# Patient Record
Sex: Female | Born: 1970 | Race: White | Hispanic: No | Marital: Married | Smoking: Never smoker
Health system: Southern US, Community
[De-identification: ages and names within clinical notes are randomized; demographics above are authoritative.]

## PROBLEM LIST (undated history)

## (undated) DIAGNOSIS — Z87442 Personal history of urinary calculi: Secondary | ICD-10-CM

## (undated) DIAGNOSIS — E876 Hypokalemia: Secondary | ICD-10-CM

## (undated) DIAGNOSIS — N950 Postmenopausal bleeding: Secondary | ICD-10-CM

## (undated) DIAGNOSIS — L719 Rosacea, unspecified: Secondary | ICD-10-CM

## (undated) DIAGNOSIS — K219 Gastro-esophageal reflux disease without esophagitis: Secondary | ICD-10-CM

## (undated) DIAGNOSIS — D500 Iron deficiency anemia secondary to blood loss (chronic): Secondary | ICD-10-CM

## (undated) DIAGNOSIS — K649 Unspecified hemorrhoids: Secondary | ICD-10-CM

## (undated) DIAGNOSIS — I10 Essential (primary) hypertension: Secondary | ICD-10-CM

## (undated) DIAGNOSIS — K589 Irritable bowel syndrome without diarrhea: Secondary | ICD-10-CM

## (undated) DIAGNOSIS — M5431 Sciatica, right side: Secondary | ICD-10-CM

## (undated) DIAGNOSIS — J329 Chronic sinusitis, unspecified: Secondary | ICD-10-CM

## (undated) DIAGNOSIS — Z8639 Personal history of other endocrine, nutritional and metabolic disease: Secondary | ICD-10-CM

## (undated) DIAGNOSIS — D509 Iron deficiency anemia, unspecified: Secondary | ICD-10-CM

## (undated) DIAGNOSIS — K581 Irritable bowel syndrome with constipation: Secondary | ICD-10-CM

## (undated) DIAGNOSIS — M51379 Other intervertebral disc degeneration, lumbosacral region without mention of lumbar back pain or lower extremity pain: Secondary | ICD-10-CM

## (undated) DIAGNOSIS — R112 Nausea with vomiting, unspecified: Secondary | ICD-10-CM

## (undated) DIAGNOSIS — J189 Pneumonia, unspecified organism: Secondary | ICD-10-CM

## (undated) DIAGNOSIS — M5137 Other intervertebral disc degeneration, lumbosacral region: Secondary | ICD-10-CM

## (undated) DIAGNOSIS — Z8489 Family history of other specified conditions: Secondary | ICD-10-CM

## (undated) DIAGNOSIS — D649 Anemia, unspecified: Secondary | ICD-10-CM

## (undated) DIAGNOSIS — Z9889 Other specified postprocedural states: Secondary | ICD-10-CM

## (undated) DIAGNOSIS — R102 Pelvic and perineal pain: Secondary | ICD-10-CM

## (undated) DIAGNOSIS — N920 Excessive and frequent menstruation with regular cycle: Secondary | ICD-10-CM

## (undated) DIAGNOSIS — M5430 Sciatica, unspecified side: Secondary | ICD-10-CM

## (undated) HISTORY — PX: UPPER GI ENDOSCOPY: SHX6162

---

## 2000-06-29 HISTORY — PX: DIAGNOSTIC LAPAROSCOPY: SUR761

## 2001-06-29 HISTORY — PX: EYE SURGERY: SHX253

## 2003-06-30 HISTORY — PX: FOOT SURGERY: SHX648

## 2005-09-22 ENCOUNTER — Other Ambulatory Visit: Admission: RE | Admit: 2005-09-22 | Discharge: 2005-09-22 | Payer: Self-pay | Admitting: Obstetrics & Gynecology

## 2009-04-01 ENCOUNTER — Ambulatory Visit (HOSPITAL_COMMUNITY): Admission: RE | Admit: 2009-04-01 | Discharge: 2009-04-01 | Payer: Self-pay | Admitting: Obstetrics & Gynecology

## 2010-06-29 HISTORY — PX: COLONOSCOPY W/ POLYPECTOMY: SHX1380

## 2010-06-29 HISTORY — PX: APPENDECTOMY: SHX54

## 2011-03-09 DIAGNOSIS — D649 Anemia, unspecified: Secondary | ICD-10-CM | POA: Insufficient documentation

## 2011-05-15 ENCOUNTER — Observation Stay (HOSPITAL_COMMUNITY)
Admission: AD | Admit: 2011-05-15 | Discharge: 2011-05-16 | Disposition: A | Discharge status: Home / Self Care | Payer: PRIVATE HEALTH INSURANCE | Source: Ambulatory Visit | Attending: General Surgery | Admitting: General Surgery

## 2011-05-15 ENCOUNTER — Encounter (HOSPITAL_COMMUNITY): Payer: Self-pay | Admitting: *Deleted

## 2011-05-15 ENCOUNTER — Other Ambulatory Visit: Payer: Self-pay | Admitting: General Surgery

## 2011-05-15 DIAGNOSIS — R197 Diarrhea, unspecified: Secondary | ICD-10-CM | POA: Insufficient documentation

## 2011-05-15 DIAGNOSIS — R110 Nausea: Secondary | ICD-10-CM | POA: Insufficient documentation

## 2011-05-15 DIAGNOSIS — R1031 Right lower quadrant pain: Principal | ICD-10-CM | POA: Insufficient documentation

## 2011-05-15 HISTORY — DX: Pneumonia, unspecified organism: J18.9

## 2011-05-15 HISTORY — DX: Sciatica, unspecified side: M54.30

## 2011-05-15 HISTORY — DX: Gastro-esophageal reflux disease without esophagitis: K21.9

## 2011-05-15 LAB — CREATININE, SERUM
Creatinine, Ser: 0.75 mg/dL (ref 0.50–1.10)
GFR calc Af Amer: >90 mL/min (ref >90)
GFR calc non Af Amer: >90 mL/min (ref >90)

## 2011-05-15 LAB — CBC
HCT: 29.4 % — ABNORMAL LOW (ref 36.0–46.0)
Hemoglobin: 10 g/dL — ABNORMAL LOW (ref 12.0–15.0)
MCH: 28.7 pg (ref 26.0–34.0)
MCHC: 34 g/dL (ref 30.0–36.0)
MCV: 84.2 fL (ref 78.0–100.0)
Platelets: 238 10*3/uL (ref 150–400)
RBC: 3.49 MIL/uL — ABNORMAL LOW (ref 3.87–5.11)
RDW: 13.6 % (ref 11.5–15.5)
WBC: 6.6 10*3/uL (ref 4.0–10.5)

## 2011-05-15 MED ORDER — PANTOPRAZOLE SODIUM 40 MG IV SOLR
40.0000 mg | Freq: Every day | INTRAVENOUS | Status: DC
  Administered 2011-05-15: 40 mg via INTRAVENOUS
  Filled 2011-05-15: qty 40

## 2011-05-15 MED ORDER — ENOXAPARIN SODIUM 40 MG/0.4ML ~~LOC~~ SOLN
40.0000 mg | SUBCUTANEOUS | Status: DC
  Administered 2011-05-15: 40 mg via SUBCUTANEOUS
  Filled 2011-05-15: qty 0.4

## 2011-05-15 MED ORDER — ONDANSETRON HCL 4 MG/2ML IJ SOLN
4.0000 mg | Freq: Four times a day (QID) | INTRAMUSCULAR | Status: DC | PRN
  Administered 2011-05-15: 4 mg via INTRAVENOUS
  Filled 2011-05-15: qty 2

## 2011-05-15 MED ORDER — LACTATED RINGERS IV SOLN
INTRAVENOUS | Status: DC
  Administered 2011-05-15: 23:00:00 via INTRAVENOUS

## 2011-05-15 MED ORDER — DIPHENHYDRAMINE HCL 25 MG PO CAPS
25.0000 mg | ORAL_CAPSULE | Freq: Once | ORAL | Status: AC
  Administered 2011-05-15: 25 mg via ORAL
  Filled 2011-05-15: qty 1

## 2011-05-15 MED ORDER — SODIUM CHLORIDE 0.9 % IJ SOLN
INTRAMUSCULAR | Status: AC
  Administered 2011-05-15: 10 mL
  Filled 2011-05-15: qty 3

## 2011-05-15 MED ORDER — MORPHINE SULFATE 4 MG/ML IJ SOLN
1.0000 mg | INTRAMUSCULAR | Status: DC | PRN
  Administered 2011-05-15 – 2011-05-16 (×2): 1 mg via INTRAVENOUS
  Filled 2011-05-15 (×2): qty 1

## 2011-05-16 LAB — BASIC METABOLIC PANEL
BUN: 8 mg/dL (ref 6–23)
CO2: 28 mEq/L (ref 19–32)
Calcium: 8.6 mg/dL (ref 8.4–10.5)
Chloride: 107 mEq/L (ref 96–112)
Creatinine, Ser: 0.93 mg/dL (ref 0.50–1.10)
GFR calc Af Amer: 89 mL/min — ABNORMAL LOW (ref >90)
GFR calc non Af Amer: 77 mL/min — ABNORMAL LOW (ref >90)
Glucose, Bld: 87 mg/dL (ref 70–99)
Potassium: 3.8 mEq/L (ref 3.5–5.1)
Sodium: 139 mEq/L (ref 135–145)

## 2011-05-16 LAB — CBC
HCT: 30 % — ABNORMAL LOW (ref 36.0–46.0)
Hemoglobin: 10.1 g/dL — ABNORMAL LOW (ref 12.0–15.0)
MCH: 28.5 pg (ref 26.0–34.0)
MCHC: 33.7 g/dL (ref 30.0–36.0)
MCV: 84.7 fL (ref 78.0–100.0)
Platelets: 213 10*3/uL (ref 150–400)
RBC: 3.54 MIL/uL — ABNORMAL LOW (ref 3.87–5.11)
RDW: 13.5 % (ref 11.5–15.5)
WBC: 5.6 10*3/uL (ref 4.0–10.5)

## 2011-05-16 MED ORDER — CELECOXIB 100 MG PO CAPS
200.0000 mg | ORAL_CAPSULE | Freq: Two times a day (BID) | ORAL | Status: DC
  Administered 2011-05-16: 200 mg via ORAL
  Filled 2011-05-16 (×10): qty 2

## 2011-05-16 MED ORDER — SODIUM CHLORIDE 0.9 % IJ SOLN
INTRAMUSCULAR | Status: AC
  Administered 2011-05-16: 10 mL
  Filled 2011-05-16: qty 3

## 2011-05-16 NOTE — H&P (Addendum)
Kaitlin [name] is an 40 y.o. female.   Chief Complaint: Right lower quadrant abdominal pain HPI: Patient presented from Dr. Jeannette How office with a history of right lower quadrant abdominal pain since Wednesday. Patient had been to the emergency department at [facility] during the week related to this pain. She states pain started initially in the upper abdomen and then localized to the periumbilical and right lower quadrant abdominal areas. He was actually worse yesterday morning while at the emergency department but since improved somewhat. She describes it as intermittent sharp stabbing pain. She has had associated nausea but no significant episodes of emesis. No change in bowel movements although initially had some diarrhea. No melena no hematochezia. She has had a history of abdominal pain but never localized in this area. No sick contacts. No recent upper respiratory tract infections. No significant change with diet although appetite has been less. No abnormal travel or food exposure.  Past Medical History  Diagnosis Date  . Acid reflux   . Sciatica   . Pneumonia 2010    Past Surgical History  Procedure Date  . Diagnostic laparoscopy   . Eye surgery 2003  . Foot surgery 2005    History reviewed. No pertinent family history. Social History:  reports that she has never smoked. She does not have any smokeless tobacco history on file. She reports that she does not drink alcohol or use illicit drugs.  Allergies:  Allergies  Allergen Reactions  . Azithromycin Itching  . Hydrocodone Itching    Medications Prior to Admission  Medication Dose Route Frequency Provider Last Rate Last Dose  . celecoxib (CELEBREX) capsule 200 mg  200 mg Oral BID Fabio Bering      . diphenhydrAMINE (BENADRYL) capsule 25 mg  25 mg Oral Once Fabio Bering   25 mg at 05/15/11 2343  . enoxaparin (LOVENOX) injection 40 mg  40 mg Subcutaneous Q24H Anzal Bartnick C June Vacha   40 mg at 05/15/11 2300  .  lactated ringers infusion   Intravenous Continuous Fabio Bering 100 mL/hr at 05/15/11 2300    . morphine 2 MG/ML injection 1-2 mg  1-2 mg Intravenous Q4H PRN Fabio Bering   1 mg at 05/16/11 0440  . ondansetron (ZOFRAN) injection 4 mg  4 mg Intravenous Q6H PRN Fabio Bering   4 mg at 05/15/11 2300  . pantoprazole (PROTONIX) injection 40 mg  40 mg Intravenous QHS Gigi Gin Osias Resnick   40 mg at 05/15/11 2300  . sodium chloride 0.9 % injection        10 mL at 05/15/11 2300  . sodium chloride 0.9 % injection        10 mL at 05/16/11 0440   Medications Prior to Admission  Medication Sig Dispense Refill  . cetirizine (ZYRTEC) 10 MG tablet Take 10 mg by mouth daily as needed. For allergies       . cyclobenzaprine (FLEXERIL) 10 MG tablet Take 10 mg by mouth 3 (three) times daily as needed. Muscle spasms       . ferrous fumarate (HEMOCYTE - 106 MG FE) 325 (106 FE) MG TABS Take 1 tablet by mouth daily as needed. Low iron       . fluticasone (FLONASE) 50 MCG/ACT nasal spray Place 2 sprays into the nose daily as needed. allergies       . pantoprazole (PROTONIX) 40 MG tablet Take 40 mg by mouth daily.          Results for  orders placed during the hospital encounter of 05/15/11 (from the past 48 hour(s))  CBC     Status: Abnormal   Collection Time   05/15/11 10:59 PM      Component Value Range Comment   WBC 6.6  4.0 - 10.5 (K/uL)    RBC 3.49 (*) 3.87 - 5.11 (MIL/uL)    Hemoglobin 10.0 (*) 12.0 - 15.0 (g/dL)    HCT 16.1 (*) 09.6 - 46.0 (%)    MCV 84.2  78.0 - 100.0 (fL)    MCH 28.7  26.0 - 34.0 (pg)    MCHC 34.0  30.0 - 36.0 (g/dL)    RDW 04.5  40.9 - 81.1 (%)    Platelets 238  150 - 400 (K/uL)   CREATININE, SERUM     Status: Normal   Collection Time   05/15/11 10:59 PM      Component Value Range Comment   Creatinine, Ser 0.75  0.50 - 1.10 (mg/dL)    GFR calc non Af Amer >90  >90 (mL/min)    GFR calc Af Amer >90  >90 (mL/min)   BASIC METABOLIC PANEL     Status: Abnormal   Collection Time    05/16/11  6:22 AM      Component Value Range Comment   Sodium 139  135 - 145 (mEq/L)    Potassium 3.8  3.5 - 5.1 (mEq/L)    Chloride 107  96 - 112 (mEq/L)    CO2 28  19 - 32 (mEq/L)    Glucose, Bld 87  70 - 99 (mg/dL)    BUN 8  6 - 23 (mg/dL)    Creatinine, Ser 9.14  0.50 - 1.10 (mg/dL)    Calcium 8.6  8.4 - 10.5 (mg/dL)    GFR calc non Af Amer 77 (*) >90 (mL/min)    GFR calc Af Amer 89 (*) >90 (mL/min)   CBC     Status: Abnormal   Collection Time   05/16/11  6:22 AM      Component Value Range Comment   WBC 5.6  4.0 - 10.5 (K/uL)    RBC 3.54 (*) 3.87 - 5.11 (MIL/uL)    Hemoglobin 10.1 (*) 12.0 - 15.0 (g/dL)    HCT 78.2 (*) 95.6 - 46.0 (%)    MCV 84.7  78.0 - 100.0 (fL)    MCH 28.5  26.0 - 34.0 (pg)    MCHC 33.7  30.0 - 36.0 (g/dL)    RDW 21.3  08.6 - 57.8 (%)    Platelets 213  150 - 400 (K/uL)    No results found.  Review of Systems  Constitutional: Negative for fever, chills, weight loss, malaise/fatigue and diaphoresis.  HENT: Negative.   Eyes: Negative.   Respiratory: Negative.   Cardiovascular: Negative.   Gastrointestinal: Positive for nausea, abdominal pain (right lower quadrant) and diarrhea. Negative for vomiting, constipation, blood in stool and melena.  Genitourinary: Negative.   Musculoskeletal: Negative.   Skin: Negative.   Neurological: Negative.  Negative for weakness.  Endo/Heme/Allergies: Negative.   Psychiatric/Behavioral: Negative.     Blood pressure 120/76, pulse 81, temperature 98.2 F (36.8 C), temperature source Oral, resp. rate 20, height 5\' 6"  (1.676 m), weight 90 kg (198 lb 6.6 oz), SpO2 98.00%. Physical Exam  Constitutional: She is oriented to person, place, and time. She appears well-developed and well-nourished.  HENT:  Head: Normocephalic and atraumatic.  Eyes: Conjunctivae and EOM are normal. Pupils are equal, round, and reactive to light.  Neck:  Normal range of motion. Neck supple. No thyromegaly present.  Cardiovascular: Normal rate,  regular rhythm and normal heart sounds.   Respiratory: Effort normal and breath sounds normal. No respiratory distress. She has no wheezes.  GI: Soft. Bowel sounds are normal. She exhibits no distension and no mass. There is tenderness (mild right lower quadrant abdominal tenderness. No peritoneal signs.). There is no rebound and no guarding.  Musculoskeletal: Normal range of motion.  Lymphadenopathy:    She has no cervical adenopathy.  Neurological: She is alert and oriented to person, place, and time.  Skin: Skin is warm and dry.     Assessment/Plan Right lower quadrant abdominal pain. As discussed with both Dr. Dimas Aguas and the patient on the phone, I have a low suspicion that this is acute appendicitis however given her equivocal CT findings I do feel is reasonable to admit the patient for observation, bowel rest, IV fluid hydration, and reevaluate blood work and reevaluate her symptoms. She'll therefore be admitted in observation status to rule out appendiceal origin of her symptoms.  Kaijah Abts C 05/16/2011, 11:53 AM   Morning evaluation:  While patient still has some symptomatology it has not worsened overnight. Morning blood work demonstrates no elevation in her WBC count and given the course of her symptoms I do not feel at this time her appendix is the etiology of her symptoms. I am more suspicious of a viral gastroenteritis/mesenteric adenitis as her etiology or possibly exacerbation of her known and documented IBS. I feel this time advancement of her diet as appropriate and should she continue to demonstrate improvement plan for discharge this afternoon.

## 2011-05-25 NOTE — Discharge Summary (Signed)
Physician Discharge Summary  Patient ID: Kaitlin [name] MRN: [id number] DOB/AGE: 39-Aug-1972 39 y.o.  Admit date: 05/15/2011 Discharge date: 05/16/2011  Admission Diagnoses: Right lower quadrant abdominal pain  Discharge Diagnoses: Suspected mesenteric adenitis Active Problems:  * No active hospital problems. *    Discharged Condition: stable  Hospital Course: Patient was admitted as a direct admission by request of Dr. Dimas Aguas. Patient presented with several day history of right lower quadrant abdominal pain to her primary care physician, Dr. Dimas Aguas, and while blood work was within normal limits her CT evaluation of the abdomen and pelvis was unequivocal and acute early appendicitis cannot be ruled out. She was therefore admitted for 23 hour observation. IV fluid was administered as was the bowel rest and DVT prophylaxis. While she continued to have some right lower quadrant abdominal pain the morning after her admission this had improved somewhat and she was started on a diet. Her blood work remained within normal limits with a normal white blood cell count and plans were made for discharge to home.  Consults: none  Significant Diagnostic Studies: labs: CBC  Treatments: IV hydration and DVT prophylaxis  Discharge Exam: Blood pressure 120/76, pulse 81, temperature 98.2 F (36.8 C), temperature source Oral, resp. rate 20, height 5\' 6"  (1.676 m), weight 90 kg (198 lb 6.6 oz), SpO2 98.00%. General appearance: alert and no distress Eyes: Pupils equal round reactive extra commands are intact. Resp: clear to auscultation bilaterally Cardio: regular rate and rhythm GI: Soft, mild right lower quadrant abdominal pain with deep palpation, no peritoneal signs, no masses or hernias. Extremities: Warm and dry.  Disposition: Home or Self Care  Discharge Orders    Future Orders Please Complete By Expires   Diet - low sodium heart healthy      Increase activity slowly      Discharge  instructions      Comments:   Bland diet, advance as tolerated.   Call MD for:  temperature >100.4      Call MD for:  persistant nausea and vomiting      Call MD for:  severe uncontrolled pain        Discharge Medication List as of 05/16/2011  4:14 PM    CONTINUE these medications which have NOT CHANGED   Details  cetirizine (ZYRTEC) 10 MG tablet Take 10 mg by mouth daily as needed. For allergies , Until Discontinued, Historical Med    cyclobenzaprine (FLEXERIL) 10 MG tablet Take 10 mg by mouth 3 (three) times daily as needed. Muscle spasms , Until Discontinued, Historical Med    ferrous fumarate (HEMOCYTE - 106 MG FE) 325 (106 FE) MG TABS Take 1 tablet by mouth daily as needed. Low iron , Until Discontinued, Historical Med    fluticasone (FLONASE) 50 MCG/ACT nasal spray Place 2 sprays into the nose daily as needed. allergies , Until Discontinued, Historical Med    pantoprazole (PROTONIX) 40 MG tablet Take 40 mg by mouth daily.  , Until Discontinued, Historical Med       Follow-up Information    Follow up with Ahmet Schank C. (As needed)    Contact information:   [address] [place] [address] [phone]       Make an appointment with HOWARD,KEVIN P.   Contact information:   [address] [place] [address] [phone]          Signed: Fabio Bering 05/25/2011, 10:56 AM

## 2011-06-23 HISTORY — PX: APPENDECTOMY: SHX54

## 2011-11-18 ENCOUNTER — Encounter: Payer: PRIVATE HEALTH INSURANCE | Admitting: Internal Medicine

## 2011-11-18 DIAGNOSIS — D509 Iron deficiency anemia, unspecified: Secondary | ICD-10-CM

## 2012-04-18 DIAGNOSIS — I10 Essential (primary) hypertension: Secondary | ICD-10-CM | POA: Insufficient documentation

## 2012-06-29 HISTORY — PX: BREAST BIOPSY: SHX20

## 2012-06-29 HISTORY — PX: BREAST SURGERY: SHX581

## 2012-08-22 ENCOUNTER — Encounter: Payer: PRIVATE HEALTH INSURANCE | Admitting: Internal Medicine

## 2012-08-22 DIAGNOSIS — K589 Irritable bowel syndrome without diarrhea: Secondary | ICD-10-CM

## 2012-08-22 DIAGNOSIS — Z800 Family history of malignant neoplasm of digestive organs: Secondary | ICD-10-CM

## 2012-08-22 DIAGNOSIS — N920 Excessive and frequent menstruation with regular cycle: Secondary | ICD-10-CM

## 2012-08-22 DIAGNOSIS — D509 Iron deficiency anemia, unspecified: Secondary | ICD-10-CM

## 2012-11-17 DIAGNOSIS — D509 Iron deficiency anemia, unspecified: Secondary | ICD-10-CM

## 2012-12-13 DIAGNOSIS — R079 Chest pain, unspecified: Secondary | ICD-10-CM

## 2013-06-05 ENCOUNTER — Other Ambulatory Visit: Payer: Self-pay | Admitting: Neurological Surgery

## 2013-06-09 ENCOUNTER — Encounter (HOSPITAL_COMMUNITY)
Admission: RE | Admit: 2013-06-09 | Discharge: 2013-06-09 | Disposition: A | Discharge status: Home / Self Care | Payer: PRIVATE HEALTH INSURANCE | Source: Ambulatory Visit | Attending: Neurological Surgery | Admitting: Neurological Surgery

## 2013-06-09 ENCOUNTER — Encounter (HOSPITAL_COMMUNITY): Payer: Self-pay

## 2013-06-09 ENCOUNTER — Encounter (HOSPITAL_COMMUNITY): Payer: Self-pay | Admitting: Pharmacy Technician

## 2013-06-09 ENCOUNTER — Ambulatory Visit (HOSPITAL_COMMUNITY)
Admission: RE | Admit: 2013-06-09 | Discharge: 2013-06-09 | Disposition: A | Discharge status: Home / Self Care | Payer: PRIVATE HEALTH INSURANCE | Source: Ambulatory Visit | Attending: Neurological Surgery | Admitting: Neurological Surgery

## 2013-06-09 DIAGNOSIS — J8410 Pulmonary fibrosis, unspecified: Secondary | ICD-10-CM | POA: Insufficient documentation

## 2013-06-09 DIAGNOSIS — Z01818 Encounter for other preprocedural examination: Secondary | ICD-10-CM | POA: Insufficient documentation

## 2013-06-09 DIAGNOSIS — Z01812 Encounter for preprocedural laboratory examination: Secondary | ICD-10-CM | POA: Insufficient documentation

## 2013-06-09 DIAGNOSIS — I10 Essential (primary) hypertension: Secondary | ICD-10-CM | POA: Insufficient documentation

## 2013-06-09 HISTORY — DX: Anemia, unspecified: D64.9

## 2013-06-09 HISTORY — DX: Hypokalemia: E87.6

## 2013-06-09 HISTORY — DX: Essential (primary) hypertension: I10

## 2013-06-09 HISTORY — DX: Chronic sinusitis, unspecified: J32.9

## 2013-06-09 LAB — BASIC METABOLIC PANEL
BUN: 15 mg/dL (ref 6–23)
CO2: 28 mEq/L (ref 19–32)
Calcium: 9.5 mg/dL (ref 8.4–10.5)
Chloride: 103 mEq/L (ref 96–112)
Creatinine, Ser: 0.83 mg/dL (ref 0.50–1.10)
GFR calc Af Amer: >90 mL/min (ref >90)
GFR calc non Af Amer: 86 mL/min — ABNORMAL LOW (ref >90)
Glucose, Bld: 96 mg/dL (ref 70–99)
Potassium: 4 mEq/L (ref 3.5–5.1)
Sodium: 140 mEq/L (ref 135–145)

## 2013-06-09 LAB — CBC
HCT: 38 % (ref 36.0–46.0)
Hemoglobin: 13.5 g/dL (ref 12.0–15.0)
MCH: 31.9 pg (ref 26.0–34.0)
MCHC: 35.5 g/dL (ref 30.0–36.0)
MCV: 89.8 fL (ref 78.0–100.0)
Platelets: 290 10*3/uL (ref 150–400)
RBC: 4.23 MIL/uL (ref 3.87–5.11)
RDW: 12 % (ref 11.5–15.5)
WBC: 9.5 10*3/uL (ref 4.0–10.5)

## 2013-06-09 LAB — HCG, SERUM, QUALITATIVE: Preg, Serum: NEGATIVE

## 2013-06-09 LAB — SURGICAL PCR SCREEN
MRSA, PCR: NEGATIVE
Staphylococcus aureus: NEGATIVE

## 2013-06-09 NOTE — Pre-Procedure Instructions (Signed)
Kaitlin [name]  06/09/2013   Your procedure is scheduled on:  06/12/2013  Report to [facility]  2 * 3   ENTRANCE A  At 7:45  AM.  Call this number if you have problems the morning of surgery: [phone]   Remember:   Do not eat food or drink liquids after midnight.  On  Sunday   Take these medicines the morning of surgery with A SIP OF WATER: zyrtec, nasal spray, protonix, pain medicine- (tramadol if you need it)    Do not wear jewelry, make-up or nail polish.  Do not wear lotions, powders, or perfumes. You may wear deodorant.  Do not shave 48 hours prior to surgery.  Do not bring valuables to the hospital.  [facility] is not responsible                  for any belongings or valuables.               Contacts, dentures or bridgework may not be worn into surgery.  Leave suitcase in the car. After surgery it may be brought to your room.  For patients admitted to the hospital, discharge time is determined by your                treatment team.               Patients discharged the day of surgery will not be allowed to drive  home.  Name and phone number of your driver: /w spouse  Special Instructions: Shower using CHG 2 nights before surgery and the night before surgery.  If you shower the day of surgery use CHG.  Use special wash - you have one bottle of CHG for all showers.  You should use approximately 1/3 of the bottle for each shower.   Please read over the following fact sheets that you were given: Pain Booklet, Coughing and Deep Breathing, MRSA Information and Surgical Site Infection Prevention

## 2013-06-09 NOTE — Progress Notes (Signed)
Call to [facility] requested last EKG from med. Records, spoke with Jasmine December.

## 2013-06-11 MED ORDER — CEFAZOLIN SODIUM-DEXTROSE 2-3 GM-% IV SOLR
2.0000 g | INTRAVENOUS | Status: AC
  Administered 2013-06-12: 2 g via INTRAVENOUS
  Filled 2013-06-11: qty 50

## 2013-06-12 ENCOUNTER — Ambulatory Visit (HOSPITAL_COMMUNITY): Payer: PRIVATE HEALTH INSURANCE

## 2013-06-12 ENCOUNTER — Ambulatory Visit (HOSPITAL_COMMUNITY): Payer: PRIVATE HEALTH INSURANCE | Admitting: Anesthesiology

## 2013-06-12 ENCOUNTER — Encounter (HOSPITAL_COMMUNITY): Payer: PRIVATE HEALTH INSURANCE | Admitting: Anesthesiology

## 2013-06-12 ENCOUNTER — Encounter (HOSPITAL_COMMUNITY): Payer: Self-pay | Admitting: *Deleted

## 2013-06-12 ENCOUNTER — Ambulatory Visit (HOSPITAL_COMMUNITY)
Admission: RE | Admit: 2013-06-12 | Discharge: 2013-06-13 | Disposition: A | Discharge status: Home / Self Care | Payer: PRIVATE HEALTH INSURANCE | Source: Ambulatory Visit | Attending: Neurological Surgery | Admitting: Neurological Surgery

## 2013-06-12 ENCOUNTER — Encounter (HOSPITAL_COMMUNITY)
Admission: RE | Disposition: A | Discharge status: Home / Self Care | Payer: Self-pay | Source: Ambulatory Visit | Attending: Neurological Surgery

## 2013-06-12 DIAGNOSIS — M50222 Other cervical disc displacement at C5-C6 level: Secondary | ICD-10-CM | POA: Diagnosis present

## 2013-06-12 DIAGNOSIS — I10 Essential (primary) hypertension: Secondary | ICD-10-CM | POA: Insufficient documentation

## 2013-06-12 DIAGNOSIS — M5430 Sciatica, unspecified side: Secondary | ICD-10-CM | POA: Insufficient documentation

## 2013-06-12 DIAGNOSIS — M5020 Other cervical disc displacement, unspecified cervical region: Secondary | ICD-10-CM | POA: Insufficient documentation

## 2013-06-12 HISTORY — PX: ANTERIOR CERVICAL DECOMP/DISCECTOMY FUSION: SHX1161

## 2013-06-12 SURGERY — ANTERIOR CERVICAL DECOMPRESSION/DISCECTOMY FUSION 1 LEVEL
Anesthesia: General | Site: Neck

## 2013-06-12 MED ORDER — OXYCODONE HCL 5 MG PO TABS
5.0000 mg | ORAL_TABLET | Freq: Once | ORAL | Status: AC | PRN
  Administered 2013-06-12: 5 mg via ORAL

## 2013-06-12 MED ORDER — HYDROMORPHONE HCL PF 1 MG/ML IJ SOLN
INTRAMUSCULAR | Status: AC
  Filled 2013-06-12: qty 1

## 2013-06-12 MED ORDER — SALINE SPRAY 0.65 % NA SOLN
1.0000 | NASAL | Status: DC | PRN
  Administered 2013-06-12: 1 via NASAL
  Filled 2013-06-12: qty 44

## 2013-06-12 MED ORDER — MIDAZOLAM HCL 5 MG/5ML IJ SOLN
INTRAMUSCULAR | Status: DC | PRN
  Administered 2013-06-12: 2 mg via INTRAVENOUS

## 2013-06-12 MED ORDER — ROCURONIUM BROMIDE 100 MG/10ML IV SOLN
INTRAVENOUS | Status: DC | PRN
  Administered 2013-06-12: 50 mg via INTRAVENOUS

## 2013-06-12 MED ORDER — MENTHOL 3 MG MT LOZG: 1.0000 | LOZENGE | OROMUCOSAL | Status: DC | PRN

## 2013-06-12 MED ORDER — FENTANYL CITRATE 0.05 MG/ML IJ SOLN
INTRAMUSCULAR | Status: DC | PRN
  Administered 2013-06-12 (×2): 100 ug via INTRAVENOUS
  Administered 2013-06-12: 50 ug via INTRAVENOUS
  Administered 2013-06-12: 100 ug via INTRAVENOUS

## 2013-06-12 MED ORDER — PROMETHAZINE HCL 25 MG/ML IJ SOLN
6.2500 mg | INTRAMUSCULAR | Status: DC | PRN
  Administered 2013-06-12: 6.25 mg via INTRAVENOUS

## 2013-06-12 MED ORDER — LIDOCAINE HCL (CARDIAC) 20 MG/ML IV SOLN
INTRAVENOUS | Status: DC | PRN
  Administered 2013-06-12: 30 mg via INTRAVENOUS

## 2013-06-12 MED ORDER — OXYCODONE HCL 5 MG/5ML PO SOLN: 5.0000 mg | Freq: Once | ORAL | Status: AC | PRN

## 2013-06-12 MED ORDER — METHOCARBAMOL 500 MG PO TABS
ORAL_TABLET | ORAL | Status: AC
  Filled 2013-06-12: qty 1

## 2013-06-12 MED ORDER — MEPERIDINE HCL 25 MG/ML IJ SOLN: 6.2500 mg | INTRAMUSCULAR | Status: DC | PRN

## 2013-06-12 MED ORDER — SODIUM CHLORIDE 0.9 % IV SOLN: 250.0000 mL | INTRAVENOUS | Status: DC

## 2013-06-12 MED ORDER — GLYCOPYRROLATE 0.2 MG/ML IJ SOLN
INTRAMUSCULAR | Status: DC | PRN
  Administered 2013-06-12: 0.4 mg via INTRAVENOUS

## 2013-06-12 MED ORDER — TRAMADOL HCL 50 MG PO TABS
50.0000 mg | ORAL_TABLET | Freq: Three times a day (TID) | ORAL | Status: DC | PRN
  Administered 2013-06-12 – 2013-06-13 (×2): 50 mg via ORAL
  Filled 2013-06-12 (×2): qty 1

## 2013-06-12 MED ORDER — METHOCARBAMOL 100 MG/ML IJ SOLN
500.0000 mg | Freq: Four times a day (QID) | INTRAMUSCULAR | Status: DC | PRN
  Filled 2013-06-12: qty 5

## 2013-06-12 MED ORDER — LACTATED RINGERS IV SOLN
Freq: Once | INTRAVENOUS | Status: AC
  Administered 2013-06-12: 08:00:00 via INTRAVENOUS

## 2013-06-12 MED ORDER — LIDOCAINE-EPINEPHRINE 1 %-1:100000 IJ SOLN
INTRAMUSCULAR | Status: DC | PRN
  Administered 2013-06-12: 2 mL

## 2013-06-12 MED ORDER — ACETAMINOPHEN 325 MG PO TABS
650.0000 mg | ORAL_TABLET | ORAL | Status: DC | PRN
Start: 2013-06-12 — End: 2013-06-13

## 2013-06-12 MED ORDER — THROMBIN 5000 UNITS EX SOLR
CUTANEOUS | Status: DC | PRN
  Administered 2013-06-12 (×2): 5000 [IU] via TOPICAL

## 2013-06-12 MED ORDER — DEXAMETHASONE SODIUM PHOSPHATE 10 MG/ML IJ SOLN
INTRAMUSCULAR | Status: AC
  Filled 2013-06-12: qty 1

## 2013-06-12 MED ORDER — ACETAMINOPHEN 650 MG RE SUPP: 650.0000 mg | RECTAL | Status: DC | PRN

## 2013-06-12 MED ORDER — BUPIVACAINE HCL (PF) 0.25 % IJ SOLN
INTRAMUSCULAR | Status: DC | PRN
  Administered 2013-06-12: 2 mL

## 2013-06-12 MED ORDER — PANTOPRAZOLE SODIUM 40 MG PO TBEC
40.0000 mg | DELAYED_RELEASE_TABLET | Freq: Every day | ORAL | Status: DC
  Administered 2013-06-13: 40 mg via ORAL
  Filled 2013-06-12: qty 1

## 2013-06-12 MED ORDER — ONDANSETRON HCL 4 MG/2ML IJ SOLN: 4.0000 mg | INTRAMUSCULAR | Status: DC | PRN

## 2013-06-12 MED ORDER — POLYETHYLENE GLYCOL 3350 17 G PO PACK
17.0000 g | PACK | Freq: Every day | ORAL | Status: DC
  Administered 2013-06-13: 17 g via ORAL
  Filled 2013-06-12 (×2): qty 1

## 2013-06-12 MED ORDER — ONDANSETRON HCL 4 MG/2ML IJ SOLN
INTRAMUSCULAR | Status: DC | PRN
  Administered 2013-06-12: 4 mg via INTRAVENOUS

## 2013-06-12 MED ORDER — MORPHINE SULFATE 2 MG/ML IJ SOLN: 1.0000 mg | INTRAMUSCULAR | Status: DC | PRN

## 2013-06-12 MED ORDER — HEMOSTATIC AGENTS (NO CHARGE) OPTIME
TOPICAL | Status: DC | PRN
  Administered 2013-06-12: 1 via TOPICAL

## 2013-06-12 MED ORDER — SODIUM CHLORIDE 0.9 % IJ SOLN
3.0000 mL | Freq: Two times a day (BID) | INTRAMUSCULAR | Status: DC
  Administered 2013-06-12: 3 mL via INTRAVENOUS

## 2013-06-12 MED ORDER — OXYCODONE-ACETAMINOPHEN 5-325 MG PO TABS: 1.0000 | ORAL_TABLET | ORAL | Status: DC | PRN

## 2013-06-12 MED ORDER — HYDROCHLOROTHIAZIDE 25 MG PO TABS
25.0000 mg | ORAL_TABLET | Freq: Every day | ORAL | Status: DC
Start: 2013-06-13 — End: 2013-06-13
  Administered 2013-06-13: 25 mg via ORAL
  Filled 2013-06-12 (×2): qty 1

## 2013-06-12 MED ORDER — PROPOFOL 10 MG/ML IV BOLUS
INTRAVENOUS | Status: DC | PRN
  Administered 2013-06-12: 120 mg via INTRAVENOUS

## 2013-06-12 MED ORDER — SODIUM CHLORIDE 0.9 % IJ SOLN: 3.0000 mL | INTRAMUSCULAR | Status: DC | PRN

## 2013-06-12 MED ORDER — BISACODYL 10 MG RE SUPP
10.0000 mg | Freq: Every day | RECTAL | Status: DC | PRN
  Administered 2013-06-12: 10 mg via RECTAL
  Filled 2013-06-12: qty 1

## 2013-06-12 MED ORDER — HYDROMORPHONE HCL PF 1 MG/ML IJ SOLN
0.2500 mg | INTRAMUSCULAR | Status: DC | PRN
  Administered 2013-06-12 (×4): 0.5 mg via INTRAVENOUS

## 2013-06-12 MED ORDER — POTASSIUM CHLORIDE CRYS ER 10 MEQ PO TBCR
10.0000 meq | EXTENDED_RELEASE_TABLET | Freq: Two times a day (BID) | ORAL | Status: DC
  Administered 2013-06-12: 10 meq via ORAL
  Filled 2013-06-12 (×3): qty 1

## 2013-06-12 MED ORDER — 0.9 % SODIUM CHLORIDE (POUR BTL) OPTIME
TOPICAL | Status: DC | PRN
  Administered 2013-06-12: 1000 mL

## 2013-06-12 MED ORDER — SODIUM CHLORIDE 0.9 % IR SOLN
Status: DC | PRN
  Administered 2013-06-12: 13:00:00

## 2013-06-12 MED ORDER — PHENOL 1.4 % MT LIQD
1.0000 | OROMUCOSAL | Status: DC | PRN
  Filled 2013-06-12: qty 177

## 2013-06-12 MED ORDER — NEOSTIGMINE METHYLSULFATE 1 MG/ML IJ SOLN
INTRAMUSCULAR | Status: DC | PRN
  Administered 2013-06-12: 3 mg via INTRAVENOUS

## 2013-06-12 MED ORDER — LORATADINE 10 MG PO TABS
10.0000 mg | ORAL_TABLET | Freq: Every day | ORAL | Status: DC | PRN
  Filled 2013-06-12: qty 1

## 2013-06-12 MED ORDER — OXYCODONE HCL 5 MG PO TABS
ORAL_TABLET | ORAL | Status: AC
  Filled 2013-06-12: qty 1

## 2013-06-12 MED ORDER — FLUTICASONE PROPIONATE 50 MCG/ACT NA SUSP
2.0000 | Freq: Every day | NASAL | Status: DC
  Filled 2013-06-12: qty 16

## 2013-06-12 MED ORDER — MIDAZOLAM HCL 2 MG/2ML IJ SOLN: 0.5000 mg | Freq: Once | INTRAMUSCULAR | Status: DC | PRN

## 2013-06-12 MED ORDER — LACTATED RINGERS IV SOLN
INTRAVENOUS | Status: DC | PRN
  Administered 2013-06-12 (×2): via INTRAVENOUS

## 2013-06-12 MED ORDER — PROMETHAZINE HCL 25 MG/ML IJ SOLN
INTRAMUSCULAR | Status: AC
  Filled 2013-06-12: qty 1

## 2013-06-12 MED ORDER — METHOCARBAMOL 500 MG PO TABS
500.0000 mg | ORAL_TABLET | Freq: Four times a day (QID) | ORAL | Status: DC | PRN
  Administered 2013-06-12: 500 mg via ORAL

## 2013-06-12 SURGICAL SUPPLY — 62 items
BAG DECANTER FOR FLEXI CONT (MISCELLANEOUS) ×2 IMPLANT
BANDAGE GAUZE ELAST BULKY 4 IN (GAUZE/BANDAGES/DRESSINGS) IMPLANT
BIT DRILL 2.3 12 FIXED (INSTRUMENTS) ×1 IMPLANT
BIT DRILL NEURO 2X3.1 SFT TUCH (MISCELLANEOUS) ×1 IMPLANT
BUR BARREL STRAIGHT FLUTE 4.0 (BURR) ×2 IMPLANT
CANISTER SUCT 3000ML (MISCELLANEOUS) ×2 IMPLANT
CONT SPEC 4OZ CLIKSEAL STRL BL (MISCELLANEOUS) ×4 IMPLANT
DECANTER SPIKE VIAL GLASS SM (MISCELLANEOUS) ×2 IMPLANT
DERMABOND ADHESIVE PROPEN (GAUZE/BANDAGES/DRESSINGS) ×1
DERMABOND ADVANCED (GAUZE/BANDAGES/DRESSINGS) ×1
DERMABOND ADVANCED .7 DNX12 (GAUZE/BANDAGES/DRESSINGS) ×1 IMPLANT
DERMABOND ADVANCED .7 DNX6 (GAUZE/BANDAGES/DRESSINGS) ×1 IMPLANT
DRAPE LAPAROTOMY 100X72 PEDS (DRAPES) ×2 IMPLANT
DRAPE MICROSCOPE LEICA (MISCELLANEOUS) IMPLANT
DRAPE POUCH INSTRU U-SHP 10X18 (DRAPES) ×2 IMPLANT
DRESSING TELFA 8X3 (GAUZE/BANDAGES/DRESSINGS) ×2 IMPLANT
DRILL 12MM (INSTRUMENTS) ×2
DRILL NEURO 2X3.1 SOFT TOUCH (MISCELLANEOUS) ×2
DRSG OPSITE 4X5.5 SM (GAUZE/BANDAGES/DRESSINGS) ×2 IMPLANT
DURAPREP 6ML APPLICATOR 50/CS (WOUND CARE) ×2 IMPLANT
ELECT REM PT RETURN 9FT ADLT (ELECTROSURGICAL) ×2
ELECTRODE REM PT RTRN 9FT ADLT (ELECTROSURGICAL) ×1 IMPLANT
GAUZE SPONGE 4X4 16PLY XRAY LF (GAUZE/BANDAGES/DRESSINGS) IMPLANT
GLOVE BIO SURGEON STRL SZ7.5 (GLOVE) IMPLANT
GLOVE BIOGEL PI IND STRL 7.0 (GLOVE) ×4 IMPLANT
GLOVE BIOGEL PI IND STRL 7.5 (GLOVE) IMPLANT
GLOVE BIOGEL PI IND STRL 8.5 (GLOVE) ×1 IMPLANT
GLOVE BIOGEL PI INDICATOR 7.0 (GLOVE) ×4
GLOVE BIOGEL PI INDICATOR 7.5 (GLOVE)
GLOVE BIOGEL PI INDICATOR 8.5 (GLOVE) ×1
GLOVE ECLIPSE 8.5 STRL (GLOVE) ×2 IMPLANT
GLOVE EXAM NITRILE LRG STRL (GLOVE) IMPLANT
GLOVE EXAM NITRILE MD LF STRL (GLOVE) IMPLANT
GLOVE EXAM NITRILE XL STR (GLOVE) IMPLANT
GLOVE EXAM NITRILE XS STR PU (GLOVE) IMPLANT
GLOVE INDICATOR 8.5 STRL (GLOVE) ×2 IMPLANT
GLOVE SS BIOGEL STRL SZ 8 (GLOVE) ×1 IMPLANT
GLOVE SUPERSENSE BIOGEL SZ 8 (GLOVE) ×1
GLOVE SURG SS PI 7.0 STRL IVOR (GLOVE) ×8 IMPLANT
GOWN BRE IMP SLV AUR LG STRL (GOWN DISPOSABLE) IMPLANT
GOWN BRE IMP SLV AUR XL STRL (GOWN DISPOSABLE) ×6 IMPLANT
GOWN STRL REIN 2XL LVL4 (GOWN DISPOSABLE) ×2 IMPLANT
HEAD HALTER (SOFTGOODS) ×2 IMPLANT
KIT BASIN OR (CUSTOM PROCEDURE TRAY) ×2 IMPLANT
KIT ROOM TURNOVER OR (KITS) ×2 IMPLANT
NEEDLE HYPO 22GX1.5 SAFETY (NEEDLE) ×2 IMPLANT
NEEDLE SPNL 18GX3.5 QUINCKE PK (NEEDLE) ×2 IMPLANT
NEEDLE SPNL 22GX3.5 QUINCKE BK (NEEDLE) ×2 IMPLANT
NS IRRIG 1000ML POUR BTL (IV SOLUTION) ×2 IMPLANT
PACK LAMINECTOMY NEURO (CUSTOM PROCEDURE TRAY) ×2 IMPLANT
PAD ARMBOARD 7.5X6 YLW CONV (MISCELLANEOUS) ×6 IMPLANT
PLATE TRESTLE LUXE 12 1LVL (Plate) ×2 IMPLANT
RUBBERBAND STERILE (MISCELLANEOUS) IMPLANT
SCREW 12MM (Screw) ×8 IMPLANT
SPACER ASSEM CERV LORD 6M (Spacer) ×2 IMPLANT
SPONGE INTESTINAL PEANUT (DISPOSABLE) ×2 IMPLANT
SPONGE SURGIFOAM ABS GEL SZ50 (HEMOSTASIS) ×2 IMPLANT
SUT VIC AB 3-0 SH 8-18 (SUTURE) ×2 IMPLANT
SYR 20ML ECCENTRIC (SYRINGE) ×2 IMPLANT
TOWEL OR 17X24 6PK STRL BLUE (TOWEL DISPOSABLE) ×2 IMPLANT
TOWEL OR 17X26 10 PK STRL BLUE (TOWEL DISPOSABLE) ×2 IMPLANT
WATER STERILE IRR 1000ML POUR (IV SOLUTION) ×2 IMPLANT
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
## 2013-06-12 NOTE — Anesthesia Procedure Notes (Signed)
Procedure Name: Intubation Date/Time: 06/12/2013 12:24 PM Performed by: Brien Mates D Pre-anesthesia Checklist: Patient identified, Emergency Drugs available, Suction available, Timeout performed and Patient being monitored Patient Re-evaluated:Patient Re-evaluated prior to inductionOxygen Delivery Method: Circle system utilized Preoxygenation: Pre-oxygenation with 100% oxygen Intubation Type: IV induction Ventilation: Mask ventilation without difficulty Laryngoscope size: Elective Glidescope. Grade View: Grade I Tube type: Oral Tube size: 7.5 mm Number of attempts: 1 Airway Equipment and Method: Stylet and Video-laryngoscopy (head/neck maintained neutral position) Placement Confirmation: ETT inserted through vocal cords under direct vision,  positive ETCO2 and breath sounds checked- equal and bilateral Secured at: 21 cm Tube secured with: Tape Dental Injury: Teeth and Oropharynx as per pre-operative assessment

## 2013-06-12 NOTE — Anesthesia Postprocedure Evaluation (Signed)
  Anesthesia Post-op Note  Patient: Kaitlin [name]  Procedure(s) Performed: Procedure(s) with comments: ANTERIOR CERVICAL DECOMPRESSION/DISCECTOMY FUSION 1 LEVEL (N/A) - C5-6 Anterior cervical decompression/diskectomy/fusion  Patient Location: PACU  Anesthesia Type:General  Level of Consciousness: awake, alert , oriented and patient cooperative  Airway and Oxygen Therapy: Patient Spontanous Breathing and Patient connected to nasal cannula oxygen  Post-op Pain: mild  Post-op Assessment: Post-op Vital signs reviewed, Patient's Cardiovascular Status Stable, Respiratory Function Stable, Patent Airway, No signs of Nausea or vomiting and Pain level controlled  Post-op Vital Signs: Reviewed and stable  Complications: No apparent anesthesia complications

## 2013-06-12 NOTE — Transfer of Care (Signed)
Immediate Anesthesia Transfer of Care Note  Patient: Kaitlin [name]  Procedure(s) Performed: Procedure(s) with comments: ANTERIOR CERVICAL DECOMPRESSION/DISCECTOMY FUSION 1 LEVEL (N/A) - C5-6 Anterior cervical decompression/diskectomy/fusion  Patient Location: PACU  Anesthesia Type:General  Level of Consciousness: awake, alert , oriented and patient cooperative  Airway & Oxygen Therapy: Patient Spontanous Breathing and Patient connected to nasal cannula oxygen  Post-op Assessment: Report given to PACU RN, Post -op Vital signs reviewed and stable and Patient moving all extremities X 4  Post vital signs: Reviewed and stable  Complications: No apparent anesthesia complications
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
## 2013-06-12 NOTE — Progress Notes (Signed)
Orthopedic Tech Progress Note Patient Details:  Kaitlin [name] [birth year] [id number]  Ortho Devices Type of Ortho Device: Soft collar Ortho Device/Splint Location: neck Ortho Device/Splint Interventions: Application   [name] 06/12/2013, 5:59 PM

## 2013-06-12 NOTE — Progress Notes (Signed)
Patient ID: Kaitlin [name], female   DOB: June 26, 1971, 42 y.o.   MRN: [id number] Alert awake oriented. Moving all 4 extremities well. Grips still decreased slightly on the right side 4/5. Incision is clean and dry. Stable postop.

## 2013-06-12 NOTE — Anesthesia Preprocedure Evaluation (Addendum)
Anesthesia Evaluation  Patient identified by MRN, date of birth, ID band Patient awake    Reviewed: Allergy & Precautions, H&P , NPO status , Patient's Chart, lab work & pertinent test results  History of Anesthesia Complications Negative for: history of anesthetic complications  Airway Mallampati: I TM Distance: >3 FB Neck ROM: Full    Dental  (+) Teeth Intact and Dental Advisory Given   Pulmonary Recent URI , Resolved,  breath sounds clear to auscultation  Pulmonary exam normal       Cardiovascular hypertension, Pt. on medications Rhythm:Regular Rate:Normal     Neuro/Psych    GI/Hepatic Neg liver ROS, GERD-  Medicated and Controlled,  Endo/Other  Morbid obesity  Renal/GU negative Renal ROS     Musculoskeletal   Abdominal (+) + obese,   Peds  Hematology   Anesthesia Other Findings   Reproductive/Obstetrics 06/09/13 preg test NEG                          Anesthesia Physical Anesthesia Plan  ASA: II  Anesthesia Plan: General   Post-op Pain Management:    Induction: Intravenous  Airway Management Planned: Oral ETT and Video Laryngoscope Planned  Additional Equipment:   Intra-op Plan:   Post-operative Plan: Extubation in OR  Informed Consent: I have reviewed the patients History and Physical, chart, labs and discussed the procedure including the risks, benefits and alternatives for the proposed anesthesia with the patient or authorized representative who has indicated his/her understanding and acceptance.   Dental advisory given  Plan Discussed with: CRNA and Surgeon  Anesthesia Plan Comments: (Plan routine monitors, GETA with VideoGlide)       Anesthesia Quick Evaluation

## 2013-06-13 ENCOUNTER — Encounter (HOSPITAL_COMMUNITY): Payer: Self-pay | Admitting: Neurological Surgery

## 2013-06-13 NOTE — Progress Notes (Signed)
Pt and husband given D/C instructions with verbal understanding. Pt D/C'd home via wheelchair @ 1420 per MD order. Pt stable @ D/C and had no other needs at this time. Rema Fendt, RN

## 2013-06-13 NOTE — Evaluation (Signed)
Physical Therapy Evaluation Patient Details Name: Kaitlin [name] MRN: [id number] DOB: 01/23/1971 Today's Date: 06/13/2013 Time: 4098-1191 PT Time Calculation (min): 25 min  PT Assessment / Plan / Recommendation History of Present Illness  42 y.o. female admitted to [facility] on 06/12/13 for C5/6 ACDF.    Clinical Impression  Pt is POD #1 and moving well with the assist of her husband.  She is a little tentative on her feet, but reports that it improves every time she gets up and does more walking.  They showed proficiency on the stairs.  Education completed re: neck precautions, brace use, lifting restrictions and basic ergonomic set up for her desk when she returns to work.  All acute PT education and mobility completed.  PT to sign off.      PT Assessment  Patent does not need any further PT services    Follow Up Recommendations  No PT follow up;Supervision - Intermittent    Does the patient have the potential to tolerate intense rehabilitation     NA  Barriers to Discharge   NA      Equipment Recommendations  None recommended by PT    Recommendations for Other Services   None  Frequency   NA- one time eval and d/c   Precautions / Restrictions Precautions Precautions: Cervical Precaution Comments: Handout given by OT, reviewed again with PT Required Braces or Orthoses: Cervical Brace Cervical Brace: Soft collar;For comfort Restrictions Weight Bearing Restrictions: No   Pertinent Vitals/Pain See vitals flow sheet.      Mobility  Bed Mobility Bed Mobility: Right Sidelying to Sit;Sit to Sidelying Right Right Sidelying to Sit: 6: Modified independent (Device/Increase time);HOB flat Sit to Sidelying Right: 6: Modified independent (Device/Increase time);HOB flat Details for Bed Mobility Assistance: no rails, pt needed cues to remember to do log roll technique.   Transfers Transfers: Sit to Stand;Stand to Sit Sit to Stand: 5: Supervision Stand to Sit: 5:  Supervision Details for Transfer Assistance: supervision for safety as pt reports feeling generally weak post-op Ambulation/Gait Ambulation/Gait Assistance: 4: Min guard Ambulation Distance (Feet): 300 Feet Assistive device: 1 person hand held assist Ambulation/Gait Assistance Details: pt preferred to hold husband's hand while walking for stability due to reports of feeling a little wobbly on her feet.   Gait Pattern: Step-through pattern (mildly staggering) Gait velocity: decreased due to decreased ability to scan with neck restrictions and soft collar.   Stairs: Yes Stairs Assistance: 4: Min assist Stairs Assistance Details (indicate cue type and reason): min hand held assist provided by her husband with verbal cues on safe guarding technique.   Stair Management Technique: Alternating pattern;Step to pattern;Forwards;Other (comment) (hand held assist provided by husband, alternating up) Number of Stairs: 9        PT Goals(Current goals can be found in the care plan section) Acute Rehab PT Goals Patient Stated Goal: to go home PT Goal Formulation: No goals set, d/c therapy  Visit Information  Last PT Received On: 06/13/13 Assistance Needed: +1 History of Present Illness: 42 y.o. female admitted to [facility] on 06/12/13 for C5/6 ACDF.         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    Communication Communication: No difficulties Dominant Hand: Right    Cognition  Cognition Arousal/Alertness: Awake/alert Behavior During Therapy: WFL for tasks assessed/performed Overall Cognitive Status: Within Functional Limits for tasks assessed    Extremity/Trunk Assessment Upper Extremity Assessment Upper Extremity Assessment: Defer to OT evaluation RUE Deficits / Details: decreased grip and pinch strength R hand. c/o decreased sensation thumb and forearm RUE Sensation:  (decreased C 5-6 sensory distribution) RUE Coordination: decreased fine motor Lower Extremity Assessment Lower Extremity Assessment: Generalized weakness (per functional mobility assessment, MMT5/5 bil) Cervical / Trunk Assessment Cervical / Trunk Assessment: Normal   Balance Balance Balance Assessed: Yes (WFL for ADL)  End of Session PT - End of Session Equipment Utilized During Treatment: Cervical collar;Gait belt Activity Tolerance: Patient limited by fatigue Patient left: in bed;with call bell/phone within reach;with family/visitor present  GP Functional Assessment Tool Used: assist level Functional Limitation: Mobility: Walking and moving around Mobility: Walking and Moving Around Current Status (U7253): At least 1 percent but less than 20 percent impaired, limited or restricted Mobility: Walking and Moving Around Goal Status [id number]): At least 1 percent but less than 20 percent impaired, limited or restricted Mobility: Walking and Moving Around Discharge Status [phone]): At least 1 percent but less than 20 percent impaired, limited or restricted   Ger Ringenberg B. Aubry Tucholski, PT, DPT [phone]   06/13/2013, 10:49 AM

## 2013-06-13 NOTE — Progress Notes (Signed)
Occupational Therapy Treatment Patient Details Name: Kaitlin [name] MRN: [id number] DOB: 1970-12-19 Today's Date: 06/13/2013 Time: 4098-1191 OT Time Calculation (min): 36 min  OT Assessment / Plan / Recommendation  History of present illness 42 y.o. female admitted to [facility] on 06/12/13 for C5/6 ACDF.     OT comments  Completed education regarding HEP for grip and pinch strengthening, in addition to rotator cuff and scapular strengthening. Pt to continue with HEP and contact MD if strength does not improve.   Follow Up Recommendations  No OT follow up    Barriers to Discharge   none    Equipment Recommendations  None recommended by OT    Recommendations for Other Services    Frequency Min 2X/week   Progress towards OT Goals Progress towards OT goals: Goals met/education completed, patient discharged from OT  Plan Discharge plan remains appropriate    Precautions / Restrictions Precautions Precautions: Cervical Precaution Comments: Handout given by OT, reviewed again with PT Required Braces or Orthoses: Cervical Brace Cervical Brace: Soft collar;For comfort   Pertinent Vitals/Pain no apparent distress     ADL       OT Diagnosis:    OT Problem List:   OT Treatment Interventions:     OT Goals(current goals can now be found in the care plan section) Acute Rehab OT Goals Patient Stated Goal: to go home OT Goal Formulation: With patient Time For Goal Achievement: 06/20/13 Potential to Achieve Goals: Good ADL Goals Pt/caregiver will Perform Home Exercise Program: Right Upper extremity;Increased strength;With theraputty;With theraband;Independently;With written HEP provided  Visit Information  Last OT Received On: 06/13/13 Assistance Needed: +1 History of Present Illness: 42 y.o. female admitted to [facility] on 06/12/13 for C5/6 ACDF.      Subjective Data      Prior Functioning  Home Living Family/patient expects to be discharged to:: Private residence Living  Arrangements: Spouse/significant other Available Help at Discharge: Family;Available PRN/intermittently (husband works days) Type of Home: House Home Access: Stairs to enter Secretary/administrator of Steps: 3 Entrance Stairs-Rails: None Home Layout: Two level Alternate Level Stairs-Number of Steps: flight Alternate Level Stairs-Rails: None Home Equipment: Shower seat - built in Prior Function Level of Independence: Independent Comments: works as Facilities manager, 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   Communication Communication: No difficulties Dominant Hand: Right    Cognition  Cognition Arousal/Alertness: Awake/alert Behavior During Therapy: WFL for tasks assessed/performed Overall Cognitive Status: Within Functional Limits for tasks assessed    Mobility    Exercises  Other Exercises Other Exercises: RTC strengthening with and without theraband to complete without causing neck discomfort Other Exercises: grip and pinch strengthening with theraputty Other Exercises: fine motor coordination Other Exercises: scapular strengthening   Balance  WFL   End of Session OT - End of Session Activity Tolerance: Patient tolerated treatment well Patient left: in bed;with call bell/phone within reach;with family/visitor present Nurse Communication: Mobility status  GO     Kaitlin [name],Kaitlin [name] 06/13/2013, 1:47 PM [facility], OTR/L  [phone] 06/13/2013

## 2013-06-13 NOTE — Progress Notes (Signed)
Occupational Therapy Evaluation Patient Details Name: Kaitlin [name] MRN: [id number] DOB: October 10, 1970 Today's Date: 06/13/2013 Time: 8657-8469 OT Time Calculation (min): 24 min  OT Assessment / Plan / Recommendation History of present illness s/p C 5-6 ACDF   Clinical Impression   Educated pt on cervical precautions regarding ADL and mobility for ADL. Handout given. Pt with RUE weakness, including mild shoulder girdle weakness. Will return to educate pt on grip and pinch strengthening, in addition to HEP for shoulder/shoulder girdle.     OT Assessment  Patient needs continued OT Services    Follow Up Recommendations  No OT follow up    Barriers to Discharge      Equipment Recommendations  None recommended by OT    Recommendations for Other Services    Frequency  Min 2X/week    Precautions / Restrictions Precautions Precautions: Cervical Required Braces or Orthoses: Cervical Brace Cervical Brace: Soft collar;For comfort Restrictions Weight Bearing Restrictions: No   Pertinent Vitals/Pain no apparent distress     ADL  Grooming: Supervision/safety;Set up Where Assessed - Grooming: Unsupported standing Upper Body Bathing: Supervision/safety Where Assessed - Upper Body Bathing: Unsupported sitting Lower Body Bathing: Supervision/safety;Set up Where Assessed - Lower Body Bathing: Unsupported sit to stand Upper Body Dressing: Supervision/safety;Set up Where Assessed - Upper Body Dressing: Unsupported sitting Lower Body Dressing: Supervision/safety;Set up Where Assessed - Lower Body Dressing: Unsupported sit to stand Toilet Transfer: Modified independent Toilet Transfer Method: Sit to stand Toilet Transfer Equipment: Comfort height toilet Toileting - Clothing Manipulation and Hygiene: Modified independent Where Assessed - Toileting Clothing Manipulation and Hygiene: Sit to stand from 3-in-1 or toilet Transfers/Ambulation Related to ADLs: mod I ADL Comments:  Educated pt on cervical precautions. Hand out given    OT Diagnosis: Generalized weakness;Acute pain  OT Problem List: Decreased strength;Decreased coordination;Impaired sensation;Pain;Impaired UE functional use OT Treatment Interventions: Therapeutic exercise;Therapeutic activities;Patient/family education   OT Goals(Current goals can be found in the care plan section) Acute Rehab OT Goals Patient Stated Goal: to go home OT Goal Formulation: With patient Time For Goal Achievement: 06/20/13 Potential to Achieve Goals: Good  Visit Information  Last OT Received On: 06/13/13 Assistance Needed: +1 History of Present Illness: s/p C 5-6 ACDF       Prior Functioning     Home Living Family/patient expects to be discharged to:: Private residence Living Arrangements: Spouse/significant other Available Help at Discharge: Family;Available PRN/intermittently Type of Home: House Home Layout: One level Home Equipment: Shower seat - built in Prior Function Level of Independence: Independent 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 c/o decreased sensation thumb and forearm RUE Sensation:  (decreased C 5-6 sensory distribution) RUE Coordination: decreased fine motor Lower Extremity Assessment Lower Extremity Assessment: Overall WFL for tasks assessed Cervical / Trunk Assessment Cervical / Trunk Assessment: Normal     Mobility Bed Mobility Bed Mobility: Right Sidelying to Sit;Sit to Sidelying Right Right Sidelying to Sit: 5: Supervision Sit to  Sidelying Right: 5: Supervision Details for Bed Mobility Assistance: vc for cervical precautions Transfers Transfers: Sit to Stand;Stand to Sit Sit to Stand: 6: Modified independent (Device/Increase time) Stand to Sit: 6: Modified  independent (Device/Increase time) Details for Transfer Assistance: vc to maintain precautions     Exercise     Balance Balance Balance Assessed: Yes (WFL for ADL)   End of Session OT - End of Session Activity Tolerance: Patient tolerated treatment well Patient left: in bed;with call bell/phone within reach;with family/visitor present Nurse Communication: Mobility status  GO Functional Assessment Tool Used: clinical judgement Functional Limitation: Self care Self Care Current Status (Z6109): At least 1 percent but less than 20 percent impaired, limited or restricted Self Care Goal Status (U0454): 0 percent impaired, limited or restricted   Jermani Eberlein,HILLARY 06/13/2013, 10:06 AM [facility], OTR/L  [phone] 06/13/2013

## 2013-06-13 NOTE — Discharge Summary (Signed)
Physician Discharge Summary  Patient ID: Kaitlin [name] MRN: [id number] DOB/AGE: 41-May-1972 41 y.o.  Admit date: 06/12/2013 Discharge date: 06/13/2013  Admission Diagnoses: Herniated nucleus pulposus C5-C6 right with right cervical radiculopathy  Discharge Diagnoses: Herniated nucleus pulposus C5-C6 right with right cervical radiculopathy Active Problems:   Herniated nucleus pulposus, C5-6 right   Discharged Condition: good  Hospital Course: Patient was admitted to undergo anterior cervical decompression and arthrodesis. She tolerated surgery well  Consults: None  Significant Diagnostic Studies: None  Treatments: surgery: Anterior cervical decompression C5-C6 arthrodesis with structural allograft anterior plate fixation [id number].  Discharge Exam: Blood pressure 121/86, pulse 78, temperature 99 F (37.2 C), temperature source Oral, resp. rate 18, SpO2 98.00%. Mild weakness in grip and biceps on the right compared to preoperatively this is unchanged. Incision is clean and dry.  Disposition: 01-Home or Self Care  Discharge Orders   Future Orders Complete By Expires   Call MD for:  redness, tenderness, or signs of infection (pain, swelling, redness, odor or green/yellow discharge around incision site)  As directed    Call MD for:  severe uncontrolled pain  As directed    Call MD for:  temperature >100.4  As directed    Diet - low sodium heart healthy  As directed    Discharge instructions  As directed    Comments:     Okay to shower. Do not apply salves or appointments to incision. No heavy lifting with the upper extremities greater than 15 pounds. May resume driving when not requiring pain medication and patient feels comfortable with doing so.   Increase activity slowly  As directed        Medication List         cetirizine 10 MG tablet  Commonly known as:  ZYRTEC  Take 10 mg by mouth daily as needed. For allergies     cyclobenzaprine 10 MG tablet  Commonly known  as:  FLEXERIL  Take 10 mg by mouth 3 (three) times daily as needed. Muscle spasms     fluticasone 50 MCG/ACT nasal spray  Commonly known as:  FLONASE  Place 2 sprays into the nose daily. allergies     gabapentin 300 MG capsule  Commonly known as:  NEURONTIN  Take 300-900 mg by mouth at bedtime.     hydrochlorothiazide 25 MG tablet  Commonly known as:  HYDRODIURIL  Take 25 mg by mouth daily before breakfast.     meloxicam 15 MG tablet  Commonly known as:  MOBIC  Take 15 mg by mouth daily.     pantoprazole 40 MG tablet  Commonly known as:  PROTONIX  Take 40 mg by mouth daily.     polyethylene glycol packet  Commonly known as:  MIRALAX / GLYCOLAX  Take 17 g by mouth daily.     potassium chloride 10 MEQ tablet  Commonly known as:  K-DUR,KLOR-CON  Take 10 mEq by mouth 2 (two) times daily.     traMADol 50 MG tablet  Commonly known as:  ULTRAM  Take 50-100 mg by mouth 3 (three) times daily as needed for moderate pain.         SignedStefani Dama 06/13/2013, 12:55 PM

## 2016-09-08 DIAGNOSIS — E669 Obesity, unspecified: Secondary | ICD-10-CM | POA: Insufficient documentation

## 2018-03-30 ENCOUNTER — Encounter (INDEPENDENT_AMBULATORY_CARE_PROVIDER_SITE_OTHER): Payer: Self-pay | Admitting: Internal Medicine

## 2018-03-30 ENCOUNTER — Ambulatory Visit (INDEPENDENT_AMBULATORY_CARE_PROVIDER_SITE_OTHER): Payer: Managed Care, Other (non HMO) | Admitting: Internal Medicine

## 2018-03-30 ENCOUNTER — Encounter (INDEPENDENT_AMBULATORY_CARE_PROVIDER_SITE_OTHER): Payer: Self-pay | Admitting: *Deleted

## 2018-03-30 VITALS — BP 140/90 | HR 72 | Temp 98.4°F | Ht 66.0 in | Wt 211.0 lb

## 2018-03-30 DIAGNOSIS — K625 Hemorrhage of anus and rectum: Secondary | ICD-10-CM | POA: Diagnosis not present

## 2018-03-30 NOTE — Patient Instructions (Signed)
The risks of bleeding, perforation and infection were reviewed with patient.  

## 2018-03-30 NOTE — Progress Notes (Addendum)
Subjective:    Patient ID: Kaitlin [name], female    DOB: June 14, 1971, 47 y.o.   MRN: [id number]  HPI Referred by Dr. Dimas Aguas for constipation, abdominal pain,  Taking Linzess daily  and MIralax as needed for constipation. Has had some rectal bleeding with her BMs.  Has passed some clots with her BMs.   .  She says she has had a lot of rectal pain. She is not using the BR as often.  When she was passing the clots, she was not constipated.  Her symptoms stared in June.  By July her symptoms were better and she became more regular. In August she went on vacation and her symptoms returned.  Her stools are not as frequent, and she has to concentrate to have a BM. She has a BM one every 2-3 days. The linzess has helped some. Takes MIralax as needed.  Her appetite is good. No weight loss.  Her rectal pain is much better.   07/26/2015 EGD: IDA, dyspepsia. Normal. 17/2017 Colonoscopy: Personal hx of polyps and IDA Mild diverticulosis in left colon. No abnormalities.  03/24/2011 Colonoscopy: rectal bleeding.  Pedunculated polyp found at splenic flexure. Removed with snare.  No biopsy sent.  Review of Systems Past Medical History:  Diagnosis Date  . Acid reflux   . Anemia    low ferritin, dextran infusion once per yr.   . Hypertension   . Pneumonia 2010   not hospitalized  . Potassium (K) deficiency 04/2013   ED visit to [facility]  04/2013 - for (R)arm pain , ekg, done & on labs noted  decreased K+  . Sciatica    R leg  . Sinus infection Nov. 2014   now resolve, tx- /w Zpak    Past Surgical History:  Procedure Laterality Date  . ANTERIOR CERVICAL DECOMP/DISCECTOMY FUSION N/A 06/12/2013   Procedure: ANTERIOR CERVICAL DECOMPRESSION/DISCECTOMY FUSION 1 LEVEL;  Surgeon: Barnett Abu, MD;  Location: [facility];  Service: Neurosurgery;  Laterality: N/A;  C5-6 Anterior cervical decompression/diskectomy/fusion  . APPENDECTOMY  2012  . BREAST SURGERY  2014   biopsy- R  . COLONOSCOPY W/  POLYPECTOMY  2012  . DIAGNOSTIC LAPAROSCOPY    . DIAGNOSTIC LAPAROSCOPY  2002  . EYE SURGERY  2003   lasik  . FOOT SURGERY  2005   Bunionectomy    Allergies  Allergen Reactions  . Erythromycin Itching  . Hydrocodone Itching    Current Outpatient Medications on File Prior to Visit  Medication Sig Dispense Refill  . cetirizine (ZYRTEC) 10 MG tablet Take 10 mg by mouth daily as needed. For allergies     . cyclobenzaprine (FLEXERIL) 10 MG tablet Take 10 mg by mouth 3 (three) times daily as needed. Muscle spasms     . fluticasone (FLONASE) 50 MCG/ACT nasal spray Place 2 sprays into the nose daily. allergies    . gabapentin (NEURONTIN) 300 MG capsule Take 300-900 mg by mouth at bedtime.    . hydrochlorothiazide (HYDRODIURIL) 25 MG tablet Take 25 mg by mouth daily before breakfast.    . linaclotide (LINZESS) 72 MCG capsule Take 72 mcg by mouth daily before breakfast.    . omeprazole (PRILOSEC) 20 MG capsule Take 20 mg by mouth daily.    . polyethylene glycol (MIRALAX / GLYCOLAX) packet Take 17 g by mouth daily as needed.     . ranitidine (ZANTAC) 150 MG tablet Take 150 mg by mouth 2 (two) times daily.     No current facility-administered medications  on file prior to visit.         Objective:   Physical Exam Blood pressure 140/90, pulse 72, temperature 98.4 F (36.9 C), height 5\' 6"  (1.676 m), weight 211 lb (95.7 kg). Alert and oriented. Skin warm and dry. Oral mucosa is moist.   . Sclera anicteric, conjunctivae is pink. Thyroid not enlarged. No cervical lymphadenopathy. Lungs clear. Heart regular rate and rhythm.  Abdomen is soft. Bowel sounds are positive. No hepatomegaly. No abdominal masses felt. No tenderness.  No edema to lower extremities. Stool brown and guaiac negative. Rectal exam: no masses felt or tears.         Assessment & Plan:  Rectal bleeding. Hemorrhoid, polyps, needs to be ruled out. Hx of constipation.  Sigmoidoscopy. Will get colonoscopy report  from [facility].

## 2018-03-31 DIAGNOSIS — K625 Hemorrhage of anus and rectum: Secondary | ICD-10-CM | POA: Insufficient documentation

## 2018-04-27 ENCOUNTER — Other Ambulatory Visit: Payer: Self-pay

## 2018-04-27 ENCOUNTER — Encounter (HOSPITAL_COMMUNITY): Payer: Self-pay | Admitting: *Deleted

## 2018-04-27 ENCOUNTER — Ambulatory Visit (HOSPITAL_COMMUNITY)
Admission: RE | Admit: 2018-04-27 | Discharge: 2018-04-27 | Disposition: A | Discharge status: Home / Self Care | Payer: Managed Care, Other (non HMO) | Source: Ambulatory Visit | Attending: Internal Medicine | Admitting: Internal Medicine

## 2018-04-27 ENCOUNTER — Encounter (HOSPITAL_COMMUNITY)
Admission: RE | Disposition: A | Discharge status: Home / Self Care | Payer: Self-pay | Source: Ambulatory Visit | Attending: Internal Medicine

## 2018-04-27 DIAGNOSIS — K644 Residual hemorrhoidal skin tags: Secondary | ICD-10-CM | POA: Diagnosis not present

## 2018-04-27 DIAGNOSIS — Z881 Allergy status to other antibiotic agents status: Secondary | ICD-10-CM | POA: Diagnosis not present

## 2018-04-27 DIAGNOSIS — Z981 Arthrodesis status: Secondary | ICD-10-CM | POA: Diagnosis not present

## 2018-04-27 DIAGNOSIS — K5900 Constipation, unspecified: Secondary | ICD-10-CM | POA: Diagnosis not present

## 2018-04-27 DIAGNOSIS — Z8249 Family history of ischemic heart disease and other diseases of the circulatory system: Secondary | ICD-10-CM | POA: Insufficient documentation

## 2018-04-27 DIAGNOSIS — Z79899 Other long term (current) drug therapy: Secondary | ICD-10-CM | POA: Diagnosis not present

## 2018-04-27 DIAGNOSIS — Z885 Allergy status to narcotic agent status: Secondary | ICD-10-CM | POA: Diagnosis not present

## 2018-04-27 DIAGNOSIS — K921 Melena: Secondary | ICD-10-CM

## 2018-04-27 DIAGNOSIS — Z7951 Long term (current) use of inhaled steroids: Secondary | ICD-10-CM | POA: Insufficient documentation

## 2018-04-27 DIAGNOSIS — I10 Essential (primary) hypertension: Secondary | ICD-10-CM | POA: Insufficient documentation

## 2018-04-27 DIAGNOSIS — K625 Hemorrhage of anus and rectum: Secondary | ICD-10-CM

## 2018-04-27 DIAGNOSIS — K219 Gastro-esophageal reflux disease without esophagitis: Secondary | ICD-10-CM | POA: Diagnosis not present

## 2018-04-27 DIAGNOSIS — K6289 Other specified diseases of anus and rectum: Secondary | ICD-10-CM | POA: Insufficient documentation

## 2018-04-27 HISTORY — PX: FLEXIBLE SIGMOIDOSCOPY: SHX5431

## 2018-04-27 SURGERY — SIGMOIDOSCOPY, FLEXIBLE
Anesthesia: Moderate Sedation

## 2018-04-27 MED ORDER — SODIUM CHLORIDE 0.9 % IV SOLN
INTRAVENOUS | Status: DC
  Administered 2018-04-27: 1000 mL via INTRAVENOUS

## 2018-04-27 MED ORDER — MEPERIDINE HCL 25 MG/ML IJ SOLN
INTRAMUSCULAR | Status: DC | PRN
  Administered 2018-04-27 (×2): 25 mg via INTRAVENOUS

## 2018-04-27 MED ORDER — MIDAZOLAM HCL 5 MG/5ML IJ SOLN
INTRAMUSCULAR | Status: AC
  Filled 2018-04-27: qty 10

## 2018-04-27 MED ORDER — MEPERIDINE HCL 50 MG/ML IJ SOLN
INTRAMUSCULAR | Status: AC
  Filled 2018-04-27: qty 1

## 2018-04-27 MED ORDER — BENEFIBER DRINK MIX PO PACK: 4.0000 g | PACK | Freq: Every day | ORAL | Status: DC

## 2018-04-27 MED ORDER — FAMOTIDINE 20 MG PO TABS: 20.0000 mg | ORAL_TABLET | Freq: Every evening | ORAL | Status: DC | PRN

## 2018-04-27 MED ORDER — MIDAZOLAM HCL 5 MG/5ML IJ SOLN
INTRAMUSCULAR | Status: DC | PRN
  Administered 2018-04-27 (×2): 2 mg via INTRAVENOUS

## 2018-04-27 MED ORDER — LUBIPROSTONE 24 MCG PO CAPS: 24.0000 ug | ORAL_CAPSULE | Freq: Two times a day (BID) | ORAL | 5 refills | Status: DC

## 2018-04-27 MED ORDER — STERILE WATER FOR IRRIGATION IR SOLN
Status: DC | PRN
  Administered 2018-04-27: 09:00:00

## 2018-04-27 NOTE — Progress Notes (Signed)
Kaitlin [name] cannot drive, operate heavy machinery, sign legal documents until after 0900 on 04/28/2018.

## 2018-04-27 NOTE — Discharge Instructions (Signed)
Discontinue Linzess and Ranitidine Amitiza 24 mcg by mouth twice daily. Pepcid OTC 20 mg by mouth in the evening or at bedtime on as-needed basis. Benefiber 4 g p.o. Nightly. High-fiber diet. Keep stool diary for the next 8 weeks call office with progress report. Keep record of bleeding episodes for the next 8 weeks. Next colonoscopy in January 2022.      Flexible Sigmoidoscopy, Care After This sheet gives you information about how to care for yourself after your procedure. Your health care provider may also give you more specific instructions. If you have problems or questions, contact your health care provider. What can I expect after the procedure? After the procedure, it is common to have:  Abdominal cramping or pain.  Bloating.  A small amount of rectal bleeding if you had a biopsy.  Follow these instructions at home:  Take over-the-counter and prescription medicines only as told by your health care provider.  Do not drive for 24 hours if you received a medicine to help you relax (sedative).  Keep all follow-up visits as told by your health care provider. This is important. Contact a health care provider if:  You have abdominal pain or cramping that gets worse or is not helped with medicine.  You continue to have small amounts of rectal bleeding after 24 hours.  You have nausea or vomiting.  You feel weak or dizzy.  You have a fever. Get help right away if:  You pass large blood clots or see a large amount of blood in the toilet after having a bowel movement.  You have nausea or vomiting for more than 24 hours after the procedure. This information is not intended to replace advice given to you by your health care provider. Make sure you discuss any questions you have with your health care provider. Document Released: 06/20/2013 Document Revised: 01/03/2016 Document Reviewed: 09/14/2015 Elsevier Interactive Patient Education  2018 Elsevier Inc.    High-Fiber  Diet Fiber, also called dietary fiber, is a type of carbohydrate found in fruits, vegetables, whole grains, and beans. A high-fiber diet can have many health benefits. Your health care provider may recommend a high-fiber diet to help:  Prevent constipation. Fiber can make your bowel movements more regular.  Lower your cholesterol.  Relieve hemorrhoids, uncomplicated diverticulosis, or irritable bowel syndrome.  Prevent overeating as part of a weight-loss plan.  Prevent heart disease, type 2 diabetes, and certain cancers.  What is my plan? The recommended daily intake of fiber includes:  38 grams for men under age 89.  30 grams for men over age 58.  25 grams for women under age 32.  21 grams for women over age 37.  You can get the recommended daily intake of dietary fiber by eating a variety of fruits, vegetables, grains, and beans. Your health care provider may also recommend a fiber supplement if it is not possible to get enough fiber through your diet. What do I need to know about a high-fiber diet?  Fiber supplements have not been widely studied for their effectiveness, so it is better to get fiber through food sources.  Always check the fiber content on thenutrition facts label of any prepackaged food. Look for foods that contain at least 5 grams of fiber per serving.  Ask your dietitian if you have questions about specific foods that are related to your condition, especially if those foods are not listed in the following section.  Increase your daily fiber consumption gradually. Increasing your intake of  dietary fiber too quickly may cause bloating, cramping, or gas.  Drink plenty of water. Water helps you to digest fiber. What foods can I eat? Grains Whole-grain breads. Multigrain cereal. Oats and oatmeal. Brown rice. Barley. Bulgur wheat. Millet. Bran muffins. Popcorn. Rye wafer crackers. Vegetables Sweet potatoes. Spinach. Kale. Artichokes. Cabbage. Broccoli. Green  peas. Carrots. Squash. Fruits Berries. Pears. Apples. Oranges. Avocados. Prunes and raisins. Dried figs. Meats and Other Protein Sources Navy, kidney, pinto, and soy beans. Split peas. Lentils. Nuts and seeds. Dairy Fiber-fortified yogurt. Beverages Fiber-fortified soy milk. Fiber-fortified orange juice. Other Fiber bars. The items listed above may not be a complete list of recommended foods or beverages. Contact your dietitian for more options. What foods are not recommended? Grains White bread. Pasta made with refined flour. White rice. Vegetables Fried potatoes. Canned vegetables. Well-cooked vegetables. Fruits Fruit juice. Cooked, strained fruit. Meats and Other Protein Sources Fatty cuts of meat. Fried Environmental education officer or fried fish. Dairy Milk. Yogurt. Cream cheese. Sour cream. Beverages Soft drinks. Other Cakes and pastries. Butter and oils. The items listed above may not be a complete list of foods and beverages to avoid. Contact your dietitian for more information. What are some tips for including high-fiber foods in my diet?  Eat a wide variety of high-fiber foods.  Make sure that half of all grains consumed each day are whole grains.  Replace breads and cereals made from refined flour or white flour with whole-grain breads and cereals.  Replace white rice with brown rice, bulgur wheat, or millet.  Start the day with a breakfast that is high in fiber, such as a cereal that contains at least 5 grams of fiber per serving.  Use beans in place of meat in soups, salads, or pasta.  Eat high-fiber snacks, such as berries, raw vegetables, nuts, or popcorn. This information is not intended to replace advice given to you by your health care provider. Make sure you discuss any questions you have with your health care provider. Document Released: 06/15/2005 Document Revised: 11/21/2015 Document Reviewed: 11/28/2013 Elsevier Interactive Patient Education  [employer].

## 2018-04-27 NOTE — Op Note (Signed)
[facility] Patient Name: Kaitlin [name] Procedure Date: 04/27/2018 8:14 AM MRN: [id number] Date of Birth: [birth year] Attending MD: Lionel December , MD CSN: [id number] Age: 47 Admit Type: Outpatient Procedure:                Flexible Sigmoidoscopy Indications:              Hematochezia Providers:                Lionel December, MD, Criselda Peaches. Patsy Lager, RN, Edythe Clarity, Technician Referring MD:             Selinda Flavin, MD Medicines:                Meperidine 40 mg IV, Midazolam 4 mg IV Complications:            No immediate complications. Estimated Blood Loss:     Estimated blood loss: none. Procedure:                Pre-Anesthesia Assessment:                           - Prior to the procedure, a History and Physical                            was performed, and patient medications and                            allergies were reviewed. The patient's tolerance of                            previous anesthesia was also reviewed. The risks                            and benefits of the procedure and the sedation                            options and risks were discussed with the patient.                            All questions were answered, and informed consent                            was obtained. Prior Anticoagulants: The patient has                            taken no previous anticoagulant or antiplatelet                            agents. ASA Grade Assessment: II - A patient with                            mild systemic disease. After reviewing the risks  and benefits, the patient was deemed in                            satisfactory condition to undergo the procedure.                           After obtaining informed consent, the scope was                            passed under direct vision. The [id number]-[id number]                            ([id number]) scope was introduced through the anus and   advanced to the the left transverse colon. The                            flexible sigmoidoscopy was accomplished without                            difficulty. The patient tolerated the procedure                            well. The quality of the bowel preparation was                            excellent. Scope In: 8:35:47 AM Scope Out: 8:48:32 AM Total Procedure Duration: 0 hours 12 minutes 45 seconds  Findings:      The perianal and digital rectal examinations were normal.      The rectum, recto-sigmoid colon, sigmoid colon, descending colon,       splenic flexure and transverse colon appeared normal.      External hemorrhoids were found during retroflexion. The hemorrhoids       were small.      Anal papilla(e) were hypertrophied. Impression:               - The rectum, recto-sigmoid colon, sigmoid colon,                            descending colon, splenic flexure and transverse                            colon are normal.                           - External hemorrhoids.                           - Anal papilla(e) were hypertrophied.                           - No specimens collected. Moderate Sedation:      Moderate (conscious) sedation was administered by the endoscopy nurse       and supervised by the endoscopist. The following parameters were       monitored: oxygen saturation, heart rate, blood pressure, CO2       capnography and response to care. Total physician  intraservice time was       17 minutes. Recommendation:           - Discharge patient to home (with spouse).                           - High fiber diet today.                           - Continue present medications but discontinue                            Linzess.                           - Use Amitiza (lubiprostone) 24 mcg PO BID today.                           - Benefiber 4 g po qhs.                           - Progress report in 8 weeks. Procedure Code(s):        --- Professional ---                            [phone], Sigmoidoscopy, flexible; diagnostic,                            including collection of specimen(s) by brushing or                            washing, when performed (separate procedure)                           G0500, Moderate sedation services provided by the                            same physician or other qualified health care                            professional performing a gastrointestinal                            endoscopic service that sedation supports,                            requiring the presence of an independent trained                            observer to assist in the monitoring of the                            patient's level of consciousness and physiological                            status; initial 15 minutes of intra-service time;  patient age 59 years or older (additional time may                            be reported with 11914, as appropriate) Diagnosis Code(s):        --- Professional ---                           K64.4, Residual hemorrhoidal skin tags                           K62.89, Other specified diseases of anus and rectum                           K92.1, Melena (includes Hematochezia) CPT copyright 2018 American Medical Association. All rights reserved. The codes documented in this report are preliminary and upon coder review may  be revised to meet current compliance requirements. Lionel December, MD Lionel December, MD 04/27/2018 9:02:01 AM This report has been signed electronically. Number of Addenda: 0

## 2018-04-27 NOTE — H&P (Signed)
Kaitlin [name] is an 47 y.o. female.   Chief Complaint: Patient is here for flexible sigmoidoscopy HPI: Patient is a 47 year old Caucasian female not in was been experiencing intermittent rectal bleeding since June this year.  Bleeding started when she was constipated.  Following month she noted improvement.  Then she developed constipation in August 2019 and developed hematochezia and painful defecation.  Since she has been using Linzess she has noted some improvement.  Last episode of bleeding was about a week ago.  Bleeding is always occurred with bowel movements.  She denies abdominal pain or weight loss. Has a history of colonic adenomas.  She has undergone 2 colonoscopies.  Last colonoscopy was in January 2017 by Dr. Jones Skene and no polyps were identified. 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  Past Medical History:  Diagnosis Date  . Acid reflux   . Anemia    low ferritin, dextran infusion once per yr.   . Hypertension   . Pneumonia 2010   not hospitalized  . Potassium (K) deficiency 04/2013   ED visit to [facility]  04/2013 - for (R)arm pain , ekg, done & on labs noted  decreased K+  . Sciatica    R leg  . Sinus infection Nov. 2014   now resolve, tx- /w Zpak    Past Surgical History:  Procedure Laterality Date  . ANTERIOR CERVICAL DECOMP/DISCECTOMY FUSION N/A 06/12/2013   Procedure: ANTERIOR CERVICAL DECOMPRESSION/DISCECTOMY FUSION 1 LEVEL;  Surgeon: Barnett Abu, MD;  Location: [facility];  Service: Neurosurgery;  Laterality: N/A;  C5-6 Anterior cervical decompression/diskectomy/fusion  . APPENDECTOMY  2012  . BREAST SURGERY  2014   biopsy- R  . COLONOSCOPY W/ POLYPECTOMY  2012  . DIAGNOSTIC LAPAROSCOPY  2002  . EYE SURGERY  2003   lasik  . FOOT SURGERY Right 2005   Bunionectomy    Family History  Problem Relation Age of Onset  . Prostate cancer Father   . Hypertension Brother     Social History:  reports that she has never smoked. She has never used smokeless tobacco. She reports that she does not drink alcohol or use drugs.  Allergies:  Allergies  Allergen Reactions  . Erythromycin Itching  . Hydrocodone Itching    Medications Prior to Admission  Medication Sig Dispense Refill  . cetirizine (ZYRTEC) 10 MG tablet Take 10 mg by mouth every evening.     . cyclobenzaprine (FLEXERIL) 10 MG tablet Take 10 mg by mouth 3 (three) times daily as needed for muscle spasms.     . fluticasone (FLONASE) 50 MCG/ACT nasal spray Place 2 sprays into both nostrils daily.     Marland Kitchen gabapentin (NEURONTIN) 300 MG capsule Take 300-600 mg by mouth 3 (three) times daily as needed (for pain).     . hydrochlorothiazide (HYDRODIURIL) 25 MG tablet Take 25 mg by mouth daily before breakfast.    . linaclotide (LINZESS) 72 MCG capsule Take 72 mcg by mouth 2 (two) times daily.     Marland Kitchen omeprazole (PRILOSEC OTC) 20 MG tablet Take 20 mg by mouth daily.    . ranitidine (ZANTAC) 150 MG tablet Take 150 mg by mouth 2 (two) times daily.    . polyethylene glycol (MIRALAX / GLYCOLAX) packet Take 17 g by mouth daily as needed for moderate constipation.       No results found for this or any previous visit (from the past 48 hour(s)). No results  found.  ROS  Blood pressure (!) 145/101, pulse 85, temperature 98.1 F (36.7 C), temperature source Oral, resp. rate (!) 8, height 5\' 6"  (1.676 m), weight 90.7 kg, last menstrual period 03/17/2018, SpO2 100 %. Physical Exam  Constitutional: She appears well-developed and well-nourished.  HENT:  Mouth/Throat: Oropharynx is clear and moist.  Eyes: Conjunctivae are normal. No scleral icterus.  Neck: No thyromegaly present.  Cardiovascular: Normal rate, regular rhythm and normal heart sounds.  No murmur heard. Respiratory: Effort normal and breath sounds normal.  GI:  Abdomen is full.  It is soft and nontender with organomegaly or masses.  Musculoskeletal: She  exhibits no edema.  Lymphadenopathy:    She has no cervical adenopathy.  Neurological: She is alert.  Skin: Skin is warm and dry.     Assessment/Plan Hematochezia. Diagnostic flexible sigmoidoscopy.  Lionel December, MD 04/27/2018, 8:25 AM

## 2018-05-02 ENCOUNTER — Encounter (HOSPITAL_COMMUNITY): Payer: Self-pay | Admitting: Internal Medicine

## 2019-02-13 ENCOUNTER — Encounter (HOSPITAL_COMMUNITY): Payer: Self-pay | Admitting: *Deleted

## 2019-02-13 ENCOUNTER — Inpatient Hospital Stay (HOSPITAL_COMMUNITY): Payer: 59 | Attending: Hematology | Admitting: Hematology

## 2019-02-13 ENCOUNTER — Encounter (HOSPITAL_COMMUNITY): Payer: Self-pay | Admitting: Hematology

## 2019-02-13 ENCOUNTER — Inpatient Hospital Stay (HOSPITAL_COMMUNITY): Payer: 59

## 2019-02-13 ENCOUNTER — Other Ambulatory Visit: Payer: Self-pay

## 2019-02-13 DIAGNOSIS — K219 Gastro-esophageal reflux disease without esophagitis: Secondary | ICD-10-CM | POA: Diagnosis not present

## 2019-02-13 DIAGNOSIS — Z8042 Family history of malignant neoplasm of prostate: Secondary | ICD-10-CM | POA: Diagnosis not present

## 2019-02-13 DIAGNOSIS — K5900 Constipation, unspecified: Secondary | ICD-10-CM | POA: Diagnosis not present

## 2019-02-13 DIAGNOSIS — R232 Flushing: Secondary | ICD-10-CM

## 2019-02-13 DIAGNOSIS — D509 Iron deficiency anemia, unspecified: Secondary | ICD-10-CM | POA: Diagnosis not present

## 2019-02-13 LAB — CBC WITH DIFFERENTIAL/PLATELET
Abs Immature Granulocytes: 0.01 10*3/uL (ref 0.00–0.07)
Basophils Absolute: 0 10*3/uL (ref 0.0–0.1)
Basophils Relative: 1 %
Eosinophils Absolute: 0.1 10*3/uL (ref 0.0–0.5)
Eosinophils Relative: 1 %
HCT: 34.3 % — ABNORMAL LOW (ref 36.0–46.0)
Hemoglobin: 10.6 g/dL — ABNORMAL LOW (ref 12.0–15.0)
Immature Granulocytes: 0 %
Lymphocytes Relative: 37 %
Lymphs Abs: 2.4 10*3/uL (ref 0.7–4.0)
MCH: 25.2 pg — ABNORMAL LOW (ref 26.0–34.0)
MCHC: 30.9 g/dL (ref 30.0–36.0)
MCV: 81.5 fL (ref 80.0–100.0)
Monocytes Absolute: 0.4 10*3/uL (ref 0.1–1.0)
Monocytes Relative: 6 %
Neutro Abs: 3.5 10*3/uL (ref 1.7–7.7)
Neutrophils Relative %: 55 %
Platelets: 326 10*3/uL (ref 150–400)
RBC: 4.21 MIL/uL (ref 3.87–5.11)
RDW: 16.2 % — ABNORMAL HIGH (ref 11.5–15.5)
WBC: 6.4 10*3/uL (ref 4.0–10.5)
nRBC: 0 % (ref 0.0–0.2)

## 2019-02-13 LAB — RETICULOCYTES
Immature Retic Fract: 14.8 % (ref 2.3–15.9)
RBC.: 4.21 MIL/uL (ref 3.87–5.11)
Retic Count, Absolute: 71.6 10*3/uL (ref 19.0–186.0)
Retic Ct Pct: 1.7 % (ref 0.4–3.1)

## 2019-02-13 LAB — IRON AND TIBC
Iron: 18 ug/dL — ABNORMAL LOW (ref 28–170)
Saturation Ratios: 4 % — ABNORMAL LOW (ref 10.4–31.8)
TIBC: 491 ug/dL — ABNORMAL HIGH (ref 250–450)
UIBC: 473 ug/dL

## 2019-02-13 LAB — FOLATE: Folate: 9.1 ng/mL (ref >5.9)

## 2019-02-13 LAB — VITAMIN B12: Vitamin B-12: 389 pg/mL (ref 180–914)

## 2019-02-13 LAB — LACTATE DEHYDROGENASE: LDH: 151 U/L (ref 98–192)

## 2019-02-13 LAB — FERRITIN: Ferritin: 4 ng/mL — ABNORMAL LOW (ref 11–307)

## 2019-02-13 NOTE — Patient Instructions (Signed)
[facility] at [facility] Discharge Instructions  You were seen today by Dr. Katragadda. He went over your history, family history and how you've been feeling lately. He will have blood work done today. He will see you back in 3 weeks for follow up.   Thank you for choosing [facility] at [facility] to provide your oncology and hematology care.  To afford each patient quality time with our provider, please arrive at least 15 minutes before your scheduled appointment time.   If you have a lab appointment with the Cancer Center please come in thru the  Main Entrance and check in at the main information desk  You need to re-schedule your appointment should you arrive 10 or more minutes late.  We strive to give you quality time with our providers, and arriving late affects you and other patients whose appointments are after yours.  Also, if you no show three or more times for appointments you may be dismissed from the clinic at the providers discretion.     Again, thank you for choosing [facility].  Our hope is that these requests will decrease the amount of time that you wait before being seen by our physicians.       _____________________________________________________________  Should you have questions after your visit to  Cancer Center, please contact our office at [phone] between the hours of 8:00 a.m. and 4:30 p.m.  Voicemails left after 4:00 p.m. will not be returned until the following business day.  For prescription refill requests, have your pharmacy contact our office and allow 72 hours.    Cancer Center Support Programs:   > Cancer Support Group  2nd Tuesday of the month 1pm-2pm, Journey Room    

## 2019-02-13 NOTE — Assessment & Plan Note (Addendum)
1.  Microcytic anemia: - She has a history of iron deficiency state for last several years. - Most recent blood work on 01/18/2019 shows hemoglobin of 10.3 with MCV of 77.  White count and platelet count was normal.  Ferritin was low at 5. - Patient stopped menstruating in April 2020.  She has tried taking iron pills which resulted in severe constipation. - Colonoscopy on 04/27/2018 shows normal colon, external hemorrhoids. -She denies any bleeding per rectum or melena.  No hematuria reported.  No prior history of blood transfusions. -She reported iron infusion in 2014 in [facility] - She has developed ice pica in the last 4 months.  She also feels dizzy at times. - I have discussed lab results with her in detail.  We will check her CBC today, V61, folic acid, methylmalonic acid and copper levels.  We will also check SPEP, LDH, reticulocyte count.  We will check her stool for occult blood. - I have recommended Feraheme infusions weekly x2.  We talked about the side effects in detail including rare chance of anaphylactic reactions. -Differential diagnosis includes decreased GI absorption of iron.  I will schedule her for iron infusions.  I will see her back in 4 to 6 weeks for follow-up to discuss the other blood results.  2.  Constipation: -She takes Benefiber at nighttime.  She also takes Amitiza 24 mcg twice daily.  3.  Family history: - Mother had anemia and had to take iron supplements.  Paternal grandmother had colon cancer.  2 paternal aunts had breast cancer.  Maternal uncle had colon cancer and a maternal aunt had lung cancer.

## 2019-02-13 NOTE — Progress Notes (Signed)
CONSULT NOTE  Patient Care Team: Selinda FlavinHoward, Kevin, MD as PCP - General (Family Medicine)  CHIEF COMPLAINTS/PURPOSE OF CONSULTATION:  Microcytic anemia.  HISTORY OF PRESENTING ILLNESS:  Kaitlin [name] 48 y.o. female is seen in consultation today for further work-up and management of microcytic anemia.  This consult was seen at the request of Lianne MorisErin Carroll, PA at Dr. Dian SituSasser's office.  Most recent CBC on 01/18/2019 shows hemoglobin of 10.3 with MCV of 77.  White count was 5.8 and platelet count 297.  Differential was normal.  Ferritin was low at 5.  She denies any fevers, night sweats or weight loss in the last 6 months.  Has developed hot flashes which are controlled well with black cohosh.  She never had blood transfusions.  Denies any bleeding per rectum or melena.  She reports that she has been anemic since high school.  She reportedly had iron infusions in 2014.  Taking iron tablets causes severe constipation.  She reported ice pica in the last 4 months.  Denies recent chest pains on exertion although has occasional dizziness.  Denies any epistaxis, hematuria or hematochezia.  Denies any over-the-counter NSAID ingestion.  Last colonoscopy was in October 2019.  She stopped menstruation in April 2020.colonoscopy did not show any clear evidence of bleeding.  She works as a Charity fundraiserN at [employer].  She is a non-smoker.  Family history significant for mother with anemia.  Paternal grandmother had colon cancer.  2 paternal aunts had breast cancer.  Maternal uncle had colon cancer and maternal aunt had lung cancer.    MEDICAL HISTORY:  Past Medical History:  Diagnosis Date  . Acid reflux   . Anemia    low ferritin, dextran infusion once per yr.   . Hypertension   . Pneumonia 2010   not hospitalized  . Potassium (K) deficiency 04/2013   [facility] - for (R)arm pain , ekg, done & on labs noted  decreased K+  . Sciatica    R leg  . Sinus infection Nov. 2014   now resolve, tx-  /w Zpak    SURGICAL HISTORY: Past Surgical History:  Procedure Laterality Date  . ANTERIOR CERVICAL DECOMP/DISCECTOMY FUSION N/A 06/12/2013   Procedure: ANTERIOR CERVICAL DECOMPRESSION/DISCECTOMY FUSION 1 LEVEL;  Surgeon: Barnett AbuHenry Elsner, MD;  Location: [facility]UR[facility];  Service: Neurosurgery;  Laterality: N/A;  C5-6 Anterior cervical decompression/diskectomy/fusion  . APPENDECTOMY  2012  . BREAST SURGERY  2014   biopsy- R  . COLONOSCOPY W/ POLYPECTOMY  2012  . DIAGNOSTIC LAPAROSCOPY  2002  . EYE SURGERY  2003   lasik  . FLEXIBLE SIGMOIDOSCOPY N/A 04/27/2018   Procedure: FLEXIBLE SIGMOIDOSCOPY;  Surgeon: Malissa Hippoehman, Najeeb U, MD;  Location: [facility] ENDO SU[facility];  Service: Endoscopy;  Laterality: N/A;  8:30  . FOOT SURGERY Right 2005   Bunionectomy    SOCIAL HISTORY: Social History   Socioeconomic History  . Marital status: Married    Spouse name: Not on file  . Number of children: Not on file  . Years of education: Not on file  . Highest education level: Not on file  Occupational History  . Not on file  Social Needs  . Financial resource strain: Not on file  . Food insecurity    Worry: Not on file    Inability: Not on file  . Transportation needs    Medical: Not on file    Non-medical: Not on file  Tobacco Use  . Smoking status: Never Smoker  .  Smokeless tobacco: Never Used  Substance and Sexual Activity  . Alcohol use: No  . Drug use: No  . Sexual activity: Not on file  Lifestyle  . Physical activity    Days per week: Not on file    Minutes per session: Not on file  . Stress: Not on file  Relationships  . Social Musicianconnections    Talks on phone: Not on file    Gets together: Not on file    Attends religious service: Not on file    Active member of club or organization: Not on file    Attends meetings of clubs or organizations: Not on file    Relationship status: Not on file  . Intimate partner violence    Fear of current or ex partner: Not on file    Emotionally abused: Not  on file    Physically abused: Not on file    Forced sexual activity: Not on file  Other Topics Concern  . Not on file  Social History Narrative  . Not on file    FAMILY HISTORY: Family History  Problem Relation Age of Onset  . Prostate cancer Father   . Hypertension Brother     ALLERGIES:  is allergic to erythromycin and hydrocodone.  MEDICATIONS:  Current Outpatient Medications  Medication Sig Dispense Refill  . Black Cohosh 40 MG CAPS Take 40 mg by mouth at bedtime.    . cetirizine (ZYRTEC) 10 MG tablet Take 10 mg by mouth every evening.     . cyclobenzaprine (FLEXERIL) 10 MG tablet Take 10 mg by mouth 3 (three) times daily as needed for muscle spasms.     . famotidine (PEPCID) 20 MG tablet Take 1 tablet (20 mg total) by mouth at bedtime as needed for heartburn or indigestion.    . fluticasone (FLONASE) 50 MCG/ACT nasal spray Place 2 sprays into both nostrils daily.     Marland Kitchen. gabapentin (NEURONTIN) 300 MG capsule Take 300-600 mg by mouth 3 (three) times daily as needed (for pain).     . hydrochlorothiazide (HYDRODIURIL) 25 MG tablet Take 25 mg by mouth daily before breakfast.    . lubiprostone (AMITIZA) 24 MCG capsule Take 1 capsule (24 mcg total) by mouth 2 (two) times daily with a meal. 60 capsule 5  . omeprazole (PRILOSEC OTC) 20 MG tablet Take 20 mg by mouth daily.    . Wheat Dextrin (BENEFIBER DRINK MIX) PACK Take 4 g by mouth at bedtime. (Patient not taking: Reported on 02/13/2019)     No current facility-administered medications for this visit.     REVIEW OF SYSTEMS:   Constitutional: Denies fevers, chills or abnormal night sweats.  Positive for dizziness. Eyes: Denies blurriness of vision, double vision or watery eyes Ears, nose, mouth, throat, and face: Denies mucositis or sore throat Respiratory: Denies cough, dyspnea or wheezes Cardiovascular: Denies palpitation, chest discomfort or lower extremity swelling Gastrointestinal:  Denies nausea, heartburn or change in  bowel habits Skin: Denies abnormal skin rashes Lymphatics: Denies new lymphadenopathy or easy bruising Neurological:Denies numbness, tingling or new weaknesses Behavioral/Psych: Mood is stable, no new changes  All other systems were reviewed with the patient and are negative.  PHYSICAL EXAMINATION: ECOG PERFORMANCE STATUS: 0 - Asymptomatic  Vitals:   02/13/19 1340  BP: 140/90  Pulse: 67  Resp: 18  Temp: 97.7 F (36.5 C)  SpO2: 97%   Filed Weights   02/13/19 1340  Weight: 205 lb (93 kg)    GENERAL:alert, no distress and comfortable  SKIN: skin color, texture, turgor are normal, no rashes or significant lesions EYES: normal, conjunctiva are pink and non-injected, sclera clear OROPHARYNX:no exudate, no erythema and lips, buccal mucosa, and tongue normal  NECK: supple, thyroid normal size, non-tender, without nodularity LYMPH:  no palpable lymphadenopathy in the cervical, axillary or inguinal LUNGS: clear to auscultation and percussion with normal breathing effort HEART: regular rate & rhythm and no murmurs and no lower extremity edema ABDOMEN:abdomen soft, non-tender and normal bowel sounds Musculoskeletal:no cyanosis of digits and no clubbing  PSYCH: alert & oriented x 3 with fluent speech NEURO: no focal motor/sensory deficits  LABORATORY DATA:  I have reviewed the data as listed  RADIOGRAPHIC STUDIES: I have personally reviewed the radiological images as listed and agreed with the findings in the report.  ASSESSMENT & PLAN:  Microcytic anemia 1.  Microcytic anemia: - She has a history of iron deficiency state for last several years. - Most recent blood work on 01/18/2019 shows hemoglobin of 10.3 with MCV of 77.  White count and platelet count was normal.  Ferritin was low at 5. - Patient stopped menstruating in April 2020.  She has tried taking iron pills which resulted in severe constipation. - Colonoscopy on 04/27/2018 shows normal colon, external hemorrhoids. -She  denies any bleeding per rectum or melena.  No hematuria reported.  No prior history of blood transfusions. -She reported iron infusion in 2014 in [facility]. - She has developed ice pica in the last 4 months.  She also feels dizzy at times. - I have discussed lab results with her in detail.  We will check her CBC today, Q30, folic acid, methylmalonic acid and copper levels.  We will also check SPEP, LDH, reticulocyte count.  We will check her stool for occult blood. - I have recommended Feraheme infusions weekly x2.  We talked about the side effects in detail including rare chance of anaphylactic reactions. -Differential diagnosis includes decreased GI absorption of iron.  I will schedule her for iron infusions.  I will see her back in 4 to 6 weeks for follow-up to discuss the other blood results.  2.  Constipation: -She takes Benefiber at nighttime.  She also takes Amitiza 24 mcg twice daily.  3.  Family history: - Mother had anemia and had to take iron supplements.  Paternal grandmother had colon cancer.  2 paternal aunts had breast cancer.  Maternal uncle had colon cancer and a maternal aunt had lung cancer.     All questions were answered. The patient knows to call the clinic with any problems, questions or concerns.     Derek Jack, MD 02/13/19 2:24 PM

## 2019-02-15 ENCOUNTER — Other Ambulatory Visit: Payer: Self-pay

## 2019-02-15 LAB — PROTEIN ELECTROPHORESIS, SERUM
A/G Ratio: 1.3 (ref 0.7–1.7)
Albumin ELP: 4 g/dL (ref 2.9–4.4)
Alpha-1-Globulin: 0.3 g/dL (ref 0.0–0.4)
Alpha-2-Globulin: 0.8 g/dL (ref 0.4–1.0)
Beta Globulin: 1 g/dL (ref 0.7–1.3)
Gamma Globulin: 1.1 g/dL (ref 0.4–1.8)
Globulin, Total: 3.1 g/dL (ref 2.2–3.9)
Total Protein ELP: 7.1 g/dL (ref 6.0–8.5)

## 2019-02-15 LAB — METHYLMALONIC ACID, SERUM: Methylmalonic Acid, Quantitative: 75 nmol/L (ref 0–378)

## 2019-02-16 LAB — COPPER, SERUM: Copper: 179 ug/dL — ABNORMAL HIGH (ref 72–166)

## 2019-02-17 ENCOUNTER — Other Ambulatory Visit (HOSPITAL_COMMUNITY)
Admission: RE | Admit: 2019-02-17 | Discharge: 2019-02-17 | Disposition: A | Discharge status: Home / Self Care | Payer: 59 | Source: Ambulatory Visit | Attending: Hematology | Admitting: Hematology

## 2019-02-17 DIAGNOSIS — D509 Iron deficiency anemia, unspecified: Secondary | ICD-10-CM | POA: Insufficient documentation

## 2019-02-17 LAB — OCCULT BLOOD X 1 CARD TO LAB, STOOL
Fecal Occult Bld: NEGATIVE
Fecal Occult Bld: NEGATIVE
Fecal Occult Bld: NEGATIVE

## 2019-02-20 ENCOUNTER — Other Ambulatory Visit: Payer: Self-pay

## 2019-02-20 ENCOUNTER — Encounter (HOSPITAL_COMMUNITY): Payer: Self-pay

## 2019-02-20 ENCOUNTER — Inpatient Hospital Stay (HOSPITAL_COMMUNITY): Payer: 59

## 2019-02-20 ENCOUNTER — Encounter (HOSPITAL_COMMUNITY): Payer: Self-pay | Admitting: *Deleted

## 2019-02-20 VITALS — BP 132/89 | HR 64 | Temp 96.6°F | Resp 18

## 2019-02-20 DIAGNOSIS — D509 Iron deficiency anemia, unspecified: Secondary | ICD-10-CM | POA: Diagnosis not present

## 2019-02-20 MED ORDER — SODIUM CHLORIDE 0.9 % IV SOLN
Freq: Once | INTRAVENOUS | Status: AC
  Administered 2019-02-20: 13:00:00 via INTRAVENOUS

## 2019-02-20 MED ORDER — SODIUM CHLORIDE 0.9 % IV SOLN
200.0000 mg | Freq: Once | INTRAVENOUS | Status: AC
  Administered 2019-02-20: 200 mg via INTRAVENOUS
  Filled 2019-02-20: qty 10

## 2019-02-20 NOTE — Progress Notes (Signed)
Patient tolerated iron infusion with no complaints voiced.  Peripheral IV site clean and dry with good blood return noted before and after infusion.  Band aid applied.  VSS with discharge and left ambulatory with no s/s of distress noted.  

## 2019-02-27 ENCOUNTER — Inpatient Hospital Stay (HOSPITAL_COMMUNITY): Payer: 59

## 2019-02-27 ENCOUNTER — Other Ambulatory Visit: Payer: Self-pay

## 2019-02-27 VITALS — BP 138/75 | HR 67 | Temp 98.2°F | Resp 18

## 2019-02-27 DIAGNOSIS — D509 Iron deficiency anemia, unspecified: Secondary | ICD-10-CM | POA: Diagnosis not present

## 2019-02-27 MED ORDER — SODIUM CHLORIDE 0.9 % IV SOLN
Freq: Once | INTRAVENOUS | Status: AC
  Administered 2019-02-27: 15:00:00 via INTRAVENOUS

## 2019-02-27 MED ORDER — SODIUM CHLORIDE 0.9 % IV SOLN
200.0000 mg | Freq: Once | INTRAVENOUS | Status: AC
  Administered 2019-02-27: 200 mg via INTRAVENOUS
  Filled 2019-02-27: qty 10

## 2019-02-28 ENCOUNTER — Encounter (HOSPITAL_COMMUNITY): Payer: Self-pay

## 2019-03-07 ENCOUNTER — Encounter (HOSPITAL_COMMUNITY): Payer: Self-pay

## 2019-03-07 ENCOUNTER — Encounter (HOSPITAL_COMMUNITY): Payer: Self-pay | Admitting: Hematology

## 2019-03-07 ENCOUNTER — Inpatient Hospital Stay (HOSPITAL_BASED_OUTPATIENT_CLINIC_OR_DEPARTMENT_OTHER): Payer: 59 | Admitting: Hematology

## 2019-03-07 ENCOUNTER — Inpatient Hospital Stay (HOSPITAL_COMMUNITY): Payer: 59 | Attending: Hematology

## 2019-03-07 ENCOUNTER — Other Ambulatory Visit: Payer: Self-pay

## 2019-03-07 VITALS — BP 123/72 | HR 66 | Temp 96.9°F | Resp 18 | Wt 210.0 lb

## 2019-03-07 DIAGNOSIS — K5900 Constipation, unspecified: Secondary | ICD-10-CM | POA: Insufficient documentation

## 2019-03-07 DIAGNOSIS — Z801 Family history of malignant neoplasm of trachea, bronchus and lung: Secondary | ICD-10-CM | POA: Insufficient documentation

## 2019-03-07 DIAGNOSIS — D509 Iron deficiency anemia, unspecified: Secondary | ICD-10-CM

## 2019-03-07 DIAGNOSIS — K219 Gastro-esophageal reflux disease without esophagitis: Secondary | ICD-10-CM | POA: Diagnosis not present

## 2019-03-07 DIAGNOSIS — Z803 Family history of malignant neoplasm of breast: Secondary | ICD-10-CM | POA: Insufficient documentation

## 2019-03-07 DIAGNOSIS — Z800 Family history of malignant neoplasm of digestive organs: Secondary | ICD-10-CM | POA: Insufficient documentation

## 2019-03-07 MED ORDER — SODIUM CHLORIDE 0.9 % IV SOLN
Freq: Once | INTRAVENOUS | Status: AC
  Administered 2019-03-07: 14:00:00 via INTRAVENOUS

## 2019-03-07 MED ORDER — SODIUM CHLORIDE 0.9 % IV SOLN
200.0000 mg | Freq: Once | INTRAVENOUS | Status: AC
  Administered 2019-03-07: 200 mg via INTRAVENOUS
  Filled 2019-03-07: qty 10

## 2019-03-07 NOTE — Assessment & Plan Note (Addendum)
1.  Microcytic anemia: - She has history of iron deficiency state for several years.  She stopped menstruating in April 2020, had minor bleeding on 01/29/2019. -She has taken iron pills in the past which resulted in severe constipation. -Colonoscopy on 04/27/2018 shows normal colon, external hemorrhoids. - We reviewed her blood work.  Hemoglobin was 10.6 with MCV of 81.5.  Ferritin was 4 and percent saturation of 4.  Folic acid and M22 were normal.  SPEP was negative.  Stool for occult blood x3 was negative. - She has started on Venofer and finished 2 infusions.  She is receiving third infusion today.  She already seen improvement in her ice pica. 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  She is scheduled for a total of 5 Venofer infusions.  I plan to see her back in 2 months with repeat labs.  2.  Constipation: -She takes Benefiber at nighttime.  She also takes Amitiza 24 mcg twice daily.  3.  Family history: -Mother had anemia and had to take iron supplements.  Paternal grandmother had colon cancer.  2 paternal aunts had breast cancer.  Maternal uncle had colon cancer and maternal aunt had lung cancer.

## 2019-03-07 NOTE — Patient Instructions (Signed)
[facility]  Discharge Instructions:   _______________________________________________________________  Thank you for choosing [facility] at [facility] to provide your oncology and hematology care.  To afford each patient quality time with our providers, please arrive at least 15 minutes before your scheduled appointment.  You need to re-schedule your appointment if you arrive 10 or more minutes late.  We strive to give you quality time with our providers, and arriving late affects you and other patients whose appointments are after yours.  Also, if you no show three or more times for appointments you may be dismissed from the clinic.  Again, thank you for choosing [facility] at [facility]. Our hope is that these requests will allow you access to exceptional care and in a timely manner. _______________________________________________________________  If you have questions after your visit, please contact our office at [phone] between the hours of 8:30 a.m. and 5:00 p.m. Voicemails left after 4:30 p.m. will not be returned until the following business day. _______________________________________________________________  For prescription refill requests, have your pharmacy contact our office. _______________________________________________________________  Recommendations made by the consultant and any test results will be sent to your referring physician. _______________________________________________________________ 

## 2019-03-07 NOTE — Progress Notes (Signed)
Kaitlin [name],  [id number]   CLINIC:  Medical Oncology/Hematology  PCP:  Rory Percy, MD [place] [address] [phone]   REASON FOR VISIT:  Follow-up for iron deficiency anemia.  CURRENT THERAPY: Venofer infusion.    INTERVAL HISTORY:  Kaitlin [name] 48 y.o. female seen for follow-up of iron deficiency anemia.  She was started on Venofer infusions and has received them 2 times already.  She has noticed improvement in her ice pica.  She also noticed improvement in her energy levels.  Appetite is 75%.  Energy levels are 50%.  Denies any bleeding per rectum or melena.  No change in bowel habits.  Baseline constipation is well controlled with Benefiber and Amitiza.    REVIEW OF SYSTEMS:  Review of Systems  All other systems reviewed and are negative.    PAST MEDICAL/SURGICAL HISTORY:  Past Medical History:  Diagnosis Date  . Acid reflux   . Anemia    low ferritin, dextran infusion once per yr.   . Hypertension   . Pneumonia 2010   not hospitalized  . Potassium (K) deficiency 04/2013   ED visit to [facility]  04/2013 - for (R)arm pain , ekg, done & on labs noted  decreased K+  . Sciatica    R leg  . Sinus infection Nov. 2014   now resolve, tx- /w Zpak   Past Surgical History:  Procedure Laterality Date  . ANTERIOR CERVICAL DECOMP/DISCECTOMY FUSION N/A 06/12/2013   Procedure: ANTERIOR CERVICAL DECOMPRESSION/DISCECTOMY FUSION 1 LEVEL;  Surgeon: Kristeen Miss, MD;  Location: [facility] NEURO ORS;  Service: Neurosurgery;  Laterality: N/A;  C5-6 Anterior cervical decompression/diskectomy/fusion  . APPENDECTOMY  2012  . BREAST SURGERY  2014   biopsy- R  . COLONOSCOPY W/ POLYPECTOMY  2012  . DIAGNOSTIC LAPAROSCOPY  2002  . EYE SURGERY  2003   lasik  . FLEXIBLE SIGMOIDOSCOPY N/A 04/27/2018   Procedure: FLEXIBLE SIGMOIDOSCOPY;  Surgeon: Rogene Houston, MD;  Location: [facility];  Service: Endoscopy;  Laterality: N/A;  8:30   . FOOT SURGERY Right 2005   Bunionectomy     SOCIAL HISTORY:  Social History   Socioeconomic History  . Marital status: Married    Spouse name: Not on file  . Number of children: Not on file  . Years of education: Not on file  . Highest education level: Not on file  Occupational History  . Not on file  Social Needs  . Financial resource strain: Not on file  . Food insecurity    Worry: Not on file    Inability: Not on file  . Transportation needs    Medical: Not on file    Non-medical: Not on file  Tobacco Use  . Smoking status: Never Smoker  . Smokeless tobacco: Never Used  Substance and Sexual Activity  . Alcohol use: No  . Drug use: No  . Sexual activity: Not on file  Lifestyle  . Physical activity    Days per week: Not on file    Minutes per session: Not on file  . Stress: Not on file  Relationships  . Social Herbalist on phone: Not on file    Gets together: Not on file    Attends religious service: Not on file    Active member of club or organization: Not on file    Attends meetings of clubs or organizations: Not on file    Relationship status: Not  on file  . Intimate partner violence    Fear of current or ex partner: Not on file    Emotionally abused: Not on file    Physically abused: Not on file    Forced sexual activity: Not on file  Other Topics Concern  . Not on file  Social History Narrative  . Not on file    FAMILY HISTORY:  Family History  Problem Relation Age of Onset  . Prostate cancer Father   . Hypertension Brother     CURRENT MEDICATIONS:  Outpatient Encounter Medications as of 03/07/2019  Medication Sig  . Black Cohosh 40 MG CAPS Take 40 mg by mouth at bedtime.  . cetirizine (ZYRTEC) 10 MG tablet Take 10 mg by mouth every evening.   . cyclobenzaprine (FLEXERIL) 10 MG tablet Take 10 mg by mouth 3 (three) times daily as needed for muscle spasms.   . famotidine (PEPCID) 20 MG tablet Take 1 tablet (20 mg total) by mouth at  bedtime as needed for heartburn or indigestion.  . fluticasone (FLONASE) 50 MCG/ACT nasal spray Place 2 sprays into both nostrils daily.   Marland Kitchen gabapentin (NEURONTIN) 300 MG capsule Take 300-600 mg by mouth 3 (three) times daily as needed (for pain).   . hydrochlorothiazide (HYDRODIURIL) 25 MG tablet Take 25 mg by mouth daily before breakfast.  . lubiprostone (AMITIZA) 24 MCG capsule Take 1 capsule (24 mcg total) by mouth 2 (two) times daily with a meal.  . omeprazole (PRILOSEC) 20 MG capsule Take 20 mg by mouth daily.   Facility-Administered Encounter Medications as of 03/07/2019  Medication  . [COMPLETED] 0.9 %  sodium chloride infusion  . iron sucrose (VENOFER) 200 mg in sodium chloride 0.9 % 150 mL IVPB    ALLERGIES:  Allergies  Allergen Reactions  . Erythromycin Itching  . Hydrocodone Itching     PHYSICAL EXAM:  ECOG Performance status: 0  There were no vitals filed for this visit. There were no vitals filed for this visit.  Physical Exam Vitals signs reviewed.  Constitutional:      Appearance: Normal appearance.  Cardiovascular:     Rate and Rhythm: Normal rate and regular rhythm.     Heart sounds: Normal heart sounds.  Pulmonary:     Effort: Pulmonary effort is normal.  Abdominal:     General: There is no distension.     Palpations: Abdomen is soft. There is no mass.  Musculoskeletal:        General: No swelling.  Skin:    General: Skin is warm.  Neurological:     General: No focal deficit present.     Mental Status: She is alert and oriented to person, place, and time.  Psychiatric:        Mood and Affect: Mood normal.        Behavior: Behavior normal.      LABORATORY DATA:  I have reviewed the labs as listed.  CBC    Component Value Date/Time   WBC 6.4 02/13/2019 1420   RBC 4.21 02/13/2019 1420   RBC 4.21 02/13/2019 1420   HGB 10.6 (L) 02/13/2019 1420   HCT 34.3 (L) 02/13/2019 1420   PLT 326 02/13/2019 1420   MCV 81.5 02/13/2019 1420   MCH 25.2 (L)  02/13/2019 1420   MCHC 30.9 02/13/2019 1420   RDW 16.2 (H) 02/13/2019 1420   LYMPHSABS 2.4 02/13/2019 1420   MONOABS 0.4 02/13/2019 1420   EOSABS 0.1 02/13/2019 1420   BASOSABS 0.0 02/13/2019 1420  CMP Latest Ref Rng & Units 06/09/2013 05/16/2011 05/15/2011  Glucose 70 - 99 mg/dL 96 87 -  BUN 6 - 23 mg/dL 15 8 -  Creatinine 1.610.50 - 1.10 mg/dL 0.960.83 0.450.93 4.090.75  Sodium 135 - 145 mEq/L 140 139 -  Potassium 3.5 - 5.1 mEq/L 4.0 3.8 -  Chloride 96 - 112 mEq/L 103 107 -  CO2 19 - 32 mEq/L 28 28 -  Calcium 8.4 - 10.5 mg/dL 9.5 8.6 -       DIAGNOSTIC IMAGING:  I have independently reviewed the scans and discussed with the patient.    ASSESSMENT & PLAN:   Microcytic anemia 1.  Microcytic anemia: - She has history of iron deficiency state for several years.  She stopped menstruating in April 2020, had minor bleeding on 01/29/2019. -She has taken iron pills in the past which resulted in severe constipation. -Colonoscopy on 04/27/2018 shows normal colon, external hemorrhoids. - We reviewed her blood work.  Hemoglobin was 10.6 with MCV of 81.5.  Ferritin was 4 and percent saturation of 4.  Folic acid and B12 were normal.  SPEP was negative.  Stool for occult blood x3 was negative. - She has started on Venofer and finished 2 infusions.  She is receiving third infusion today.  She already seen improvement in her ice pica. 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  She is scheduled for a total of 5 Venofer infusions.  I plan to see her back in 2 months with repeat labs.  2.  Constipation: -She takes Benefiber at nighttime.  She also takes Amitiza 24 mcg twice daily.  3.  Family history: -Mother had anemia and had to take iron supplements.  Paternal grandmother had colon cancer.  2 paternal aunts had breast cancer.  Maternal uncle had colon cancer and maternal aunt had lung cancer.   Total time spent is 25 minutes with more than 50% of time spent face-to-face discussing treatment  plan, counseling and coordination of care.  Orders placed this encounter:  Orders Placed This Encounter  Procedures  . CBC with Differential/Platelet  . Comprehensive metabolic panel  . Iron and TIBC  . Ferritin  . Vitamin B12  . Folate      Doreatha MassedSreedhar Katragadda, MD [facility] [phone]

## 2019-03-07 NOTE — Progress Notes (Signed)
Venofer given per orders. Patient tolerated it well without problems. Vitals stable and discharged home from clinic ambulatory. Follow up as scheduled.  

## 2019-03-07 NOTE — Patient Instructions (Addendum)
[facility] at [facility] Discharge Instructions  You were seen today by Dr. Katragadda. He went over your recent lab results. He will see you back in 2 months for labs and follow up.   Thank you for choosing [facility] at [facility] to provide your oncology and hematology care.  To afford each patient quality time with our provider, please arrive at least 15 minutes before your scheduled appointment time.   If you have a lab appointment with the Cancer Center please come in thru the  Main Entrance and check in at the main information desk  You need to re-schedule your appointment should you arrive 10 or more minutes late.  We strive to give you quality time with our providers, and arriving late affects you and other patients whose appointments are after yours.  Also, if you no show three or more times for appointments you may be dismissed from the clinic at the providers discretion.     Again, thank you for choosing [facility].  Our hope is that these requests will decrease the amount of time that you wait before being seen by our physicians.       _____________________________________________________________  Should you have questions after your visit to [facility], please contact our office at [phone] between the hours of 8:00 a.m. and 4:30 p.m.  Voicemails left after 4:00 p.m. will not be returned until the following business day.  For prescription refill requests, have your pharmacy contact our office and allow 72 hours.    Cancer Center Support Programs:   > Cancer Support Group  2nd Tuesday of the month 1pm-2pm, Journey Room    

## 2019-03-08 ENCOUNTER — Ambulatory Visit (HOSPITAL_COMMUNITY): Payer: 59 | Admitting: Hematology

## 2019-03-13 ENCOUNTER — Ambulatory Visit (HOSPITAL_COMMUNITY): Payer: 59

## 2019-03-14 ENCOUNTER — Inpatient Hospital Stay (HOSPITAL_COMMUNITY): Payer: 59

## 2019-03-14 ENCOUNTER — Other Ambulatory Visit: Payer: Self-pay

## 2019-03-14 ENCOUNTER — Encounter (HOSPITAL_COMMUNITY): Payer: Self-pay

## 2019-03-14 VITALS — BP 125/86 | HR 65 | Temp 97.8°F | Resp 18

## 2019-03-14 DIAGNOSIS — D509 Iron deficiency anemia, unspecified: Secondary | ICD-10-CM | POA: Diagnosis not present

## 2019-03-14 MED ORDER — SODIUM CHLORIDE 0.9 % IV SOLN
200.0000 mg | Freq: Once | INTRAVENOUS | Status: AC
  Administered 2019-03-14: 200 mg via INTRAVENOUS
  Filled 2019-03-14: qty 10

## 2019-03-14 MED ORDER — SODIUM CHLORIDE 0.9 % IV SOLN
Freq: Once | INTRAVENOUS | Status: AC
  Administered 2019-03-14: 14:00:00 via INTRAVENOUS

## 2019-03-14 NOTE — Progress Notes (Signed)
Venofer given per orders. Patient tolerated it well without problems. Vitals stable and discharged home from clinic ambulatory. Follow up as scheduled.  

## 2019-03-14 NOTE — Patient Instructions (Signed)
[place] at [facility]  Discharge Instructions:  Venofer today _______________________________________________________________  Thank you for choosing [place] at [facility] to provide your oncology and hematology care.  To afford each patient quality time with our providers, please arrive at least 15 minutes before your scheduled appointment.  You need to re-schedule your appointment if you arrive 10 or more minutes late.  We strive to give you quality time with our providers, and arriving late affects you and other patients whose appointments are after yours.  Also, if you no show three or more times for appointments you may be dismissed from the clinic.  Again, thank you for choosing [place] at [facility] hope is that these requests will allow you access to exceptional care and in a timely manner. _______________________________________________________________  If you have questions after your visit, please contact our office at [phone] between the hours of 8:30 a.m. and 5:00 p.m. Voicemails left after 4:30 p.m. will not be returned until the following business day. _______________________________________________________________  For prescription refill requests, have your pharmacy contact our office. _______________________________________________________________  Recommendations made by the consultant and any test results will be sent to your referring physician. _______________________________________________________________

## 2019-03-15 ENCOUNTER — Other Ambulatory Visit (INDEPENDENT_AMBULATORY_CARE_PROVIDER_SITE_OTHER): Payer: Self-pay | Admitting: Internal Medicine

## 2019-03-16 NOTE — Telephone Encounter (Signed)
Patient will need OV prior to further refills. 

## 2019-03-20 ENCOUNTER — Encounter (HOSPITAL_COMMUNITY): Payer: Self-pay

## 2019-03-20 ENCOUNTER — Other Ambulatory Visit: Payer: Self-pay

## 2019-03-20 ENCOUNTER — Inpatient Hospital Stay (HOSPITAL_COMMUNITY): Payer: 59

## 2019-03-20 VITALS — BP 125/76 | HR 70 | Temp 97.7°F | Resp 18

## 2019-03-20 DIAGNOSIS — D509 Iron deficiency anemia, unspecified: Secondary | ICD-10-CM | POA: Diagnosis not present

## 2019-03-20 MED ORDER — SODIUM CHLORIDE 0.9% FLUSH
10.0000 mL | Freq: Once | INTRAVENOUS | Status: AC | PRN
  Administered 2019-03-20: 10 mL

## 2019-03-20 MED ORDER — SODIUM CHLORIDE 0.9 % IV SOLN
Freq: Once | INTRAVENOUS | Status: AC
  Administered 2019-03-20: 14:00:00 via INTRAVENOUS

## 2019-03-20 MED ORDER — SODIUM CHLORIDE 0.9 % IV SOLN
200.0000 mg | Freq: Once | INTRAVENOUS | Status: AC
  Administered 2019-03-20: 200 mg via INTRAVENOUS
  Filled 2019-03-20: qty 10

## 2019-03-20 NOTE — Progress Notes (Signed)
To treatment room for iron infusion.  No complaints voiced.  No s/s of distress noted.  Reviewed Venofer infusion with all questions asked and answered.    Patient tolerated iron infusion with no complaints voiced.  Peripheral IV site clean and dry with good blood return noted before and after infusion.  Band aid applied.  VSS with discharge and left ambulatory with no s/s of distress noted.

## 2019-04-12 ENCOUNTER — Encounter (HOSPITAL_COMMUNITY): Payer: Self-pay

## 2019-05-05 ENCOUNTER — Inpatient Hospital Stay (HOSPITAL_COMMUNITY): Payer: 59 | Attending: Hematology

## 2019-05-05 ENCOUNTER — Other Ambulatory Visit: Payer: Self-pay

## 2019-05-05 ENCOUNTER — Encounter (HOSPITAL_COMMUNITY): Payer: Self-pay

## 2019-05-05 DIAGNOSIS — K5900 Constipation, unspecified: Secondary | ICD-10-CM | POA: Diagnosis not present

## 2019-05-05 DIAGNOSIS — K219 Gastro-esophageal reflux disease without esophagitis: Secondary | ICD-10-CM | POA: Diagnosis not present

## 2019-05-05 DIAGNOSIS — D509 Iron deficiency anemia, unspecified: Secondary | ICD-10-CM | POA: Diagnosis not present

## 2019-05-05 DIAGNOSIS — Z79899 Other long term (current) drug therapy: Secondary | ICD-10-CM | POA: Insufficient documentation

## 2019-05-05 DIAGNOSIS — Z8249 Family history of ischemic heart disease and other diseases of the circulatory system: Secondary | ICD-10-CM | POA: Diagnosis not present

## 2019-05-05 DIAGNOSIS — R5383 Other fatigue: Secondary | ICD-10-CM | POA: Insufficient documentation

## 2019-05-05 DIAGNOSIS — I10 Essential (primary) hypertension: Secondary | ICD-10-CM | POA: Insufficient documentation

## 2019-05-05 LAB — CBC WITH DIFFERENTIAL/PLATELET
Abs Immature Granulocytes: 0.04 10*3/uL (ref 0.00–0.07)
Basophils Absolute: 0 10*3/uL (ref 0.0–0.1)
Basophils Relative: 0 %
Eosinophils Absolute: 0 10*3/uL (ref 0.0–0.5)
Eosinophils Relative: 0 %
HCT: 41.3 % (ref 36.0–46.0)
Hemoglobin: 13.5 g/dL (ref 12.0–15.0)
Immature Granulocytes: 0 %
Lymphocytes Relative: 21 %
Lymphs Abs: 2.1 10*3/uL (ref 0.7–4.0)
MCH: 29.5 pg (ref 26.0–34.0)
MCHC: 32.7 g/dL (ref 30.0–36.0)
MCV: 90.2 fL (ref 80.0–100.0)
Monocytes Absolute: 0.3 10*3/uL (ref 0.1–1.0)
Monocytes Relative: 3 %
Neutro Abs: 7.7 10*3/uL (ref 1.7–7.7)
Neutrophils Relative %: 76 %
Platelets: 331 10*3/uL (ref 150–400)
RBC: 4.58 MIL/uL (ref 3.87–5.11)
RDW: 16.3 % — ABNORMAL HIGH (ref 11.5–15.5)
WBC: 10.1 10*3/uL (ref 4.0–10.5)
nRBC: 0 % (ref 0.0–0.2)

## 2019-05-05 LAB — COMPREHENSIVE METABOLIC PANEL
ALT: 17 U/L (ref 0–44)
AST: 14 U/L — ABNORMAL LOW (ref 15–41)
Albumin: 4.2 g/dL (ref 3.5–5.0)
Alkaline Phosphatase: 71 U/L (ref 38–126)
Anion gap: 9 (ref 5–15)
BUN: 18 mg/dL (ref 6–20)
CO2: 27 mmol/L (ref 22–32)
Calcium: 8.6 mg/dL — ABNORMAL LOW (ref 8.9–10.3)
Chloride: 101 mmol/L (ref 98–111)
Creatinine, Ser: 0.75 mg/dL (ref 0.44–1.00)
GFR calc Af Amer: >60 mL/min (ref >60)
GFR calc non Af Amer: >60 mL/min (ref >60)
Glucose, Bld: 155 mg/dL — ABNORMAL HIGH (ref 70–99)
Potassium: 3.9 mmol/L (ref 3.5–5.1)
Sodium: 137 mmol/L (ref 135–145)
Total Bilirubin: 0.4 mg/dL (ref 0.3–1.2)
Total Protein: 7.3 g/dL (ref 6.5–8.1)

## 2019-05-05 LAB — FERRITIN: Ferritin: 32 ng/mL (ref 11–307)

## 2019-05-05 LAB — IRON AND TIBC
Iron: 65 ug/dL (ref 28–170)
Saturation Ratios: 18 % (ref 10.4–31.8)
TIBC: 356 ug/dL (ref 250–450)
UIBC: 291 ug/dL

## 2019-05-05 LAB — FOLATE: Folate: 6.3 ng/mL (ref >5.9)

## 2019-05-05 LAB — VITAMIN B12: Vitamin B-12: 294 pg/mL (ref 180–914)

## 2019-05-08 ENCOUNTER — Other Ambulatory Visit: Payer: Self-pay

## 2019-05-08 ENCOUNTER — Inpatient Hospital Stay (HOSPITAL_BASED_OUTPATIENT_CLINIC_OR_DEPARTMENT_OTHER): Payer: 59 | Admitting: Nurse Practitioner

## 2019-05-08 DIAGNOSIS — D509 Iron deficiency anemia, unspecified: Secondary | ICD-10-CM

## 2019-05-08 NOTE — Assessment & Plan Note (Addendum)
1. Microcytic anemia: -She has a history of iron deficiency anemia for several years now.  She stopped menstruating in April 2020, and had minor bleeding in 01/29/2019. 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 -She has taken iron pills in the past which has resulted in severe constipation. -Colonoscopy on 04/27/2018 showed normal colon, external hemorrhoids. -She has ice pica when her iron is low. -She had a work-up which involved folic acid and X77 were normal.  SPEP was negative.  Stool for occult blood x3 was negative. -She has received benefit in the past and has already seen improvement on her ice pica. -She last received 5 infusions of Venofer from 02/20/2019-03/20/2019. -She reports since the iron infusion she has been having periods every 2 weeks with bleeding 3 to 4 days and sometimes just spotting. -Labs done on 05/05/2019 showed hemoglobin 13.5, ferritin 32, percent saturation 18 -We will recheck labs in 4 weeks with a telephone interview.  2.  Constipation: -She takes Benefiber at nighttime. -She also takes Amitiza 24 mcg twice daily.

## 2019-05-08 NOTE — Progress Notes (Signed)
[address] [place], [place] [address]   CLINIC:  Medical Oncology/Hematology  PCP:  Rory Percy, MD [place] [address] [phone]   REASON FOR VISIT: Follow-up for microcytic anemia  CURRENT THERAPY: Intermittent iron infusions   INTERVAL HISTORY:  Kaitlin [name] 48 y.o. female returns for routine follow-up for microcytic anemia.  Patient reports she is still fatigued at times.  She has ongoing problems with constipation.  Otherwise she has not noticed any bright red bleeding per rectum or melena.  She denies any easy bruising. Denies any nausea, vomiting, or diarrhea. Denies any new pains. Had not noticed any recent bleeding such as epistaxis, hematuria or hematochezia. Denies recent chest pain on exertion, shortness of breath on minimal exertion, pre-syncopal episodes, or palpitations. Denies any numbness or tingling in hands or feet. Denies any recent fevers, infections, or recent hospitalizations. Patient reports appetite at 100% and energy level at 50%.  She is eating well maintain her weight at this time.     REVIEW OF SYSTEMS:  Review of Systems  Constitutional: Positive for fatigue.  Gastrointestinal: Positive for constipation.  All other systems reviewed and are negative.    PAST MEDICAL/SURGICAL HISTORY:  Past Medical History:  Diagnosis Date  . Acid reflux   . Anemia    low ferritin, dextran infusion once per yr.   . Hypertension   . Pneumonia 2010   not hospitalized  . Potassium (K) deficiency 04/2013   ED visit to [facility]  04/2013 - for (R)arm pain , ekg, done & on labs noted  decreased K+  . Sciatica    R leg  . Sinus infection Nov. 2014   now resolve, tx- /w Zpak   Past Surgical History:  Procedure Laterality Date  . ANTERIOR CERVICAL DECOMP/DISCECTOMY FUSION N/A 06/12/2013   Procedure: ANTERIOR CERVICAL DECOMPRESSION/DISCECTOMY FUSION 1 LEVEL;  Surgeon: Kristeen Miss, MD;  Location: [place] NEURO ORS;  Service:  Neurosurgery;  Laterality: N/A;  C5-6 Anterior cervical decompression/diskectomy/fusion  . APPENDECTOMY  2012  . BREAST SURGERY  2014   biopsy- R  . COLONOSCOPY W/ POLYPECTOMY  2012  . DIAGNOSTIC LAPAROSCOPY  2002  . EYE SURGERY  2003   lasik  . FLEXIBLE SIGMOIDOSCOPY N/A 04/27/2018   Procedure: FLEXIBLE SIGMOIDOSCOPY;  Surgeon: Rogene Houston, MD;  Location: [facility];  Service: Endoscopy;  Laterality: N/A;  8:30  . FOOT SURGERY Right 2005   Bunionectomy     SOCIAL HISTORY:  Social History   Socioeconomic History  . Marital status: Married    Spouse name: Not on file  . Number of children: Not on file  . Years of education: Not on file  . Highest education level: Not on file  Occupational History  . Not on file  Social Needs  . Financial resource strain: Not on file  . Food insecurity    Worry: Not on file    Inability: Not on file  . Transportation needs    Medical: Not on file    Non-medical: Not on file  Tobacco Use  . Smoking status: Never Smoker  . Smokeless tobacco: Never Used  Substance and Sexual Activity  . Alcohol use: No  . Drug use: No  . Sexual activity: Not on file  Lifestyle  . Physical activity    Days per week: Not on file    Minutes per session: Not on file  . Stress: Not on file  Relationships  . Social connections  Talks on phone: Not on file    Gets together: Not on file    Attends religious service: Not on file    Active member of club or organization: Not on file    Attends meetings of clubs or organizations: Not on file    Relationship status: Not on file  . Intimate partner violence    Fear of current or ex partner: Not on file    Emotionally abused: Not on file    Physically abused: Not on file    Forced sexual activity: Not on file  Other Topics Concern  . Not on file  Social History Narrative  . Not on file    FAMILY HISTORY:  Family History  Problem Relation Age of Onset  . Prostate cancer Father   .  Hypertension Brother     CURRENT MEDICATIONS:  Outpatient Encounter Medications as of 05/08/2019  Medication Sig  . AMITIZA 24 MCG capsule TAKE 1 CAPSULE BY MOUTH TWICE DAILY WITH A MEAL  . Black Cohosh 40 MG CAPS Take 40 mg by mouth at bedtime.  . cetirizine (ZYRTEC) 10 MG tablet Take 10 mg by mouth every evening.   . CHOLECALCIFEROL PO Take 5,000 Int'l Units by mouth daily.  . famotidine (PEPCID) 20 MG tablet Take 1 tablet (20 mg total) by mouth at bedtime as needed for heartburn or indigestion. (Patient taking differently: Take 20 mg by mouth at bedtime. )  . fluticasone (FLONASE) 50 MCG/ACT nasal spray Place 2 sprays into both nostrils daily.   . hydrochlorothiazide (HYDRODIURIL) 25 MG tablet Take 25 mg by mouth daily before breakfast.  . Omega-3 Fatty Acids (FISH OIL) 1000 MG CAPS Take 1,000 mg by mouth at bedtime.  Marland Kitchen. omeprazole (PRILOSEC) 20 MG capsule Take 20 mg by mouth daily.  . cyclobenzaprine (FLEXERIL) 10 MG tablet Take 10 mg by mouth 3 (three) times daily as needed for muscle spasms.   Marland Kitchen. gabapentin (NEURONTIN) 300 MG capsule Take 300-600 mg by mouth 3 (three) times daily as needed (for pain).   . NON FORMULARY 2 capsules at bedtime. Apple cidar vinegar   No facility-administered encounter medications on file as of 05/08/2019.     ALLERGIES:  Allergies  Allergen Reactions  . Erythromycin Itching  . Hydrocodone Itching     PHYSICAL EXAM:  ECOG Performance status: 1  Vitals:   05/08/19 1528  BP: 120/79  Pulse: 85  Resp: 16  Temp: 97.7 F (36.5 C)  SpO2: 96%   Filed Weights   05/08/19 1528  Weight: 214 lb (97.1 kg)    Physical Exam Constitutional:      Appearance: Normal appearance. She is normal weight.  Cardiovascular:     Rate and Rhythm: Normal rate and regular rhythm.     Heart sounds: Normal heart sounds.  Pulmonary:     Effort: Pulmonary effort is normal.     Breath sounds: Normal breath sounds.  Abdominal:     General: Bowel sounds are normal.      Palpations: Abdomen is soft.  Musculoskeletal: Normal range of motion.  Skin:    General: Skin is warm.  Neurological:     Mental Status: She is alert and oriented to person, place, and time. Mental status is at baseline.  Psychiatric:        Mood and Affect: Mood normal.        Behavior: Behavior normal.        Thought Content: Thought content normal.  Judgment: Judgment normal.      LABORATORY DATA:  I have reviewed the labs as listed.  CBC    Component Value Date/Time   WBC 10.1 05/05/2019 1554   RBC 4.58 05/05/2019 1554   HGB 13.5 05/05/2019 1554   HCT 41.3 05/05/2019 1554   PLT 331 05/05/2019 1554   MCV 90.2 05/05/2019 1554   MCH 29.5 05/05/2019 1554   MCHC 32.7 05/05/2019 1554   RDW 16.3 (H) 05/05/2019 1554   LYMPHSABS 2.1 05/05/2019 1554   MONOABS 0.3 05/05/2019 1554   EOSABS 0.0 05/05/2019 1554   BASOSABS 0.0 05/05/2019 1554   CMP Latest Ref Rng & Units 05/05/2019 06/09/2013 05/16/2011  Glucose 70 - 99 mg/dL 716(R) 96 87  BUN 6 - 20 mg/dL 18 15 8   Creatinine 0.44 - 1.00 mg/dL 6.78 9.38  Sodium 135 - 145 mmol/L 137 140 139  Potassium 3.5 - 5.1 mmol/L 3.9 4.0 3.8  Chloride 98 - 111 mmol/L 101 103 107  CO2 22 - 32 mmol/L 27 28 28   Calcium 8.9 - 10.3 mg/dL 1.01) 9.5 8.6  Total Protein 6.5 - 8.1 g/dL 7.3 - -  Total Bilirubin 0.3 - 1.2 mg/dL 0.4 - -  Alkaline Phos 38 - 126 U/L 71 - -  AST 15 - 41 U/L 14(L) - -  ALT 0 - 44 U/L 17 - -    I personally performed a face-to-face visit.  All questions were answered to patient's stated satisfaction. Encouraged patient to call with any new concerns or questions before his next visit to the cancer center and we can certain see him sooner, if needed.     ASSESSMENT & PLAN:   Microcytic anemia 1. Microcytic anemia: -She has a history of iron deficiency anemia for several years now.  She stopped menstruating in April 2020, and had minor bleeding in 01/29/2019. -She has iron malabsorption from Pepcid and  Prilosec. -She has taken iron pills in the past which has resulted in severe constipation. -Colonoscopy on 04/27/2018 showed normal colon, external hemorrhoids. -She has ice pica when her iron is low. -She had a work-up which involved folic acid and B12 were normal.  SPEP was negative.  Stool for occult blood x3 was negative. -She has received benefit in the past and has already seen improvement on her ice pica. -She last received 5 infusions of Venofer from 02/20/2019-03/20/2019. -She reports since the iron infusion she has been having periods every 2 weeks with bleeding 3 to 4 days and sometimes just spotting. -Labs done on 05/05/2019 showed hemoglobin 13.5, ferritin 32, percent saturation 18 -We will recheck labs in 4 weeks with a telephone interview.  2.  Constipation: -She takes Benefiber at nighttime. -She also takes Amitiza 24 mcg twice daily.       Orders placed this encounter:  Orders Placed This Encounter  Procedures  . Lactate dehydrogenase  . CBC with Differential/Platelet  . Comprehensive metabolic panel  . Ferritin  . Iron and TIBC  . Vitamin B12  . Vitamin D 25 hydroxy  . Folate      03/22/2019, FNP-C [facility] [phone]

## 2019-05-09 NOTE — Patient Instructions (Signed)
[facility] at [facility] Discharge Instructions  Follow up in 1 month with labs    Thank you for choosing [facility] at [facility] to provide your oncology and hematology care.  To afford each patient quality time with our provider, please arrive at least 15 minutes before your scheduled appointment time.   If you have a lab appointment with the Cancer Center please come in thru the Main Entrance and check in at the main information desk.  You need to re-schedule your appointment should you arrive 10 or more minutes late.  We strive to give you quality time with our providers, and arriving late affects you and other patients whose appointments are after yours.  Also, if you no show three or more times for appointments you may be dismissed from the clinic at the providers discretion.     Again, thank you for choosing [facility].  Our hope is that these requests will decrease the amount of time that you wait before being seen by our physicians.       _____________________________________________________________  Should you have questions after your visit to  Cancer Center, please contact our office at [phone] between the hours of 8:00 a.m. and 4:30 p.m.  Voicemails left after 4:00 p.m. will not be returned until the following business day.  For prescription refill requests, have your pharmacy contact our office and allow 72 hours.    Due to Covid, you will need to wear a mask upon entering the hospital. If you do not have a mask, a mask will be given to you at the Main Entrance upon arrival. For doctor visits, patients may have 1 support person with them. For treatment visits, patients can not have anyone with them due to social distancing guidelines and our immunocompromised population.      

## 2019-05-16 ENCOUNTER — Encounter (INDEPENDENT_AMBULATORY_CARE_PROVIDER_SITE_OTHER): Payer: Self-pay | Admitting: Internal Medicine

## 2019-05-16 ENCOUNTER — Ambulatory Visit (INDEPENDENT_AMBULATORY_CARE_PROVIDER_SITE_OTHER): Payer: 59 | Admitting: Internal Medicine

## 2019-05-16 ENCOUNTER — Other Ambulatory Visit: Payer: Self-pay

## 2019-05-16 DIAGNOSIS — K219 Gastro-esophageal reflux disease without esophagitis: Secondary | ICD-10-CM

## 2019-05-16 DIAGNOSIS — D509 Iron deficiency anemia, unspecified: Secondary | ICD-10-CM

## 2019-05-16 NOTE — Progress Notes (Signed)
Presenting complaint;  History of iron deficiency anemia and hematochezia.  Database and subjective:  Patient is 48 year old Caucasian female who has a history of iron deficiency anemia where she was evaluated by Dr. Doristine Mango in January 2017 when she had EGD and colonoscopy.  No abnormality was encountered.  She did have adenomas on her prior colonoscopy. Patient was seen in October last year for recurrent hematochezia.  She underwent flexible sigmoidoscopy on 04/27/2018 which revealed external hemorrhoids otherwise normal examination to the proximal transverse colon.  Preparation was excellent. I felt her iron deficiency anemia may be due to impaired iron absorption.  She has been receiving parenteral iron intermittently. She reports 3 episodes of hematochezia in the last year.  Each time it was a small amount.  Her bowels move every other day or every third day.  She feels Amitiza is working.  She was tried on Linzess but her co-pay was $300 a month. She reports occasional heartburn.  She says it is mild and not severe.  She says she has been on PPI for at least 10 years. She has had multiple negative Hemoccults. She states she stopped having her periods in April 2020 and now she is having them every 3 weeks but they are never heavy. She is not a vegetarian.  She states she eats meat virtually every day. Family history is negative for celiac disease.  She has never been screened for this condition.  Current Medications: Outpatient Encounter Medications as of 05/16/2019  Medication Sig  . AMITIZA 24 MCG capsule TAKE 1 CAPSULE BY MOUTH TWICE DAILY WITH A MEAL  . Black Cohosh 40 MG CAPS Take 40 mg by mouth at bedtime.  . cetirizine (ZYRTEC) 10 MG tablet Take 10 mg by mouth every evening.   . CHOLECALCIFEROL PO Take 5,000 Int'l Units by mouth daily.  . cyclobenzaprine (FLEXERIL) 10 MG tablet Take 10 mg by mouth 3 (three) times daily as needed for muscle spasms.   . famotidine (PEPCID)  20 MG tablet Take 1 tablet (20 mg total) by mouth at bedtime as needed for heartburn or indigestion. (Patient taking differently: Take 20 mg by mouth at bedtime. )  . fluticasone (FLONASE) 50 MCG/ACT nasal spray Place 2 sprays into both nostrils daily.   Marland Kitchen gabapentin (NEURONTIN) 300 MG capsule Take 300-600 mg by mouth 3 (three) times daily as needed (for pain).   . hydrochlorothiazide (HYDRODIURIL) 25 MG tablet Take 25 mg by mouth daily before breakfast.  . NON FORMULARY 2 capsules at bedtime. Apple cidar vinegar  . Omega-3 Fatty Acids (FISH OIL) 1000 MG CAPS Take 1,000 mg by mouth at bedtime.  Marland Kitchen omeprazole (PRILOSEC) 20 MG capsule Take 20 mg by mouth daily.   No facility-administered encounter medications on file as of 05/16/2019.      Objective: Blood pressure (!) 144/83, pulse 80, temperature (!) 97.3 F (36.3 C), temperature source Oral, height _0  (1.676 m), weight 214 lb (97.1 kg). Patient is alert and in no acute distress. She is wearing a mask. Conjunctiva is pink. Sclera is nonicteric Oropharyngeal mucosa is normal. No neck masses or thyromegaly noted. Cardiac exam with regular rhythm normal S1 and S2. No murmur or gallop noted. Lungs are clear to auscultation. Abdomen is full but soft and nontender with organomegaly or masses. No LE edema or clubbing noted.  Labs/studies Results:  CBC Latest Ref Rng & Units 05/05/2019 02/13/2019 06/09/2013  WBC 4.0 - 10.5 K/uL 10.1 6.4 9.5  Hemoglobin 12.0 - 15.0 g/dL  13.5 10.6(L) 13.5  Hematocrit 36.0 - 46.0 % 41.3 34.3(L) 38.0  Platelets 150 - 400 K/uL 331 326 290    CMP Latest Ref Rng & Units 05/05/2019 06/09/2013 05/16/2011  Glucose 70 - 99 mg/dL 155(H) 96 87  BUN 6 - 20 mg/dL _0 Creatinine 0.44 - 1.00 mg/dL 0.75 0.83 0.93  Sodium 135 - 145 mmol/L 137 140 139  Potassium 3.5 - 5.1 mmol/L 3.9 4.0 3.8  Chloride 98 - 111 mmol/L 101 103 107  CO2 22 - 32 mmol/L _1 Calcium 8.9 - 10.3 mg/dL 8.6(L) 9.5 8.6  Total Protein 6.5  - 8.1 g/dL 7.3 - -  Total Bilirubin 0.3 - 1.2 mg/dL 0.4 - -  Alkaline Phos 38 - 126 U/L 71 - -  AST 15 - 41 U/L 14(L) - -  ALT 0 - 44 U/L 17 - -    Hepatic Function Latest Ref Rng & Units 05/05/2019  Total Protein 6.5 - 8.1 g/dL 7.3  Albumin 3.5 - 5.0 g/dL 4.2  AST 15 - 41 U/L 14(L)  ALT 0 - 44 U/L 17  Alk Phosphatase 38 - 126 U/L 71  Total Bilirubin 0.3 - 1.2 mg/dL 0.4    She reports ferritin was 9 in January 2020. Ferritin was 4 three months ago Ferritin was 76 last month and was 3212 days ago.  Assessment:  #1.  Iron deficiency anemia.  I suspect iron deficiency anemia is due to impaired iron absorption.  She has sporadic hematochezia which would not explain her iron deficiency.  Similarly her periods are not heavy and once again would not explain her profound iron deficiency.  Needs to rule out celiac disease in this situation.  #2.  Hematochezia secondary to external hemorrhoids.  It is sporadic and therefore no therapy indicated.  #3.  Chronic GERD.  She is doing well with low-dose omeprazole in the morning and famotidine as needed at bedtime.  #4.  Chronic constipation.  She is doing well with Amitiza.   Plan:  Celiac antibody panel to be done with her next blood work in December 2020. Patient will call if she has melena or frank rectal bleeding. Office visit in 1 year. Next colonoscopy would be in January 2022(history of colonic adenoma).

## 2019-05-17 NOTE — Patient Instructions (Signed)
Physician will call the results of blood test when copy received.

## 2019-06-06 ENCOUNTER — Other Ambulatory Visit: Payer: Self-pay

## 2019-06-06 ENCOUNTER — Inpatient Hospital Stay (HOSPITAL_COMMUNITY): Payer: 59 | Attending: Hematology

## 2019-06-06 ENCOUNTER — Other Ambulatory Visit (HOSPITAL_COMMUNITY): Payer: 59

## 2019-06-06 ENCOUNTER — Other Ambulatory Visit (HOSPITAL_COMMUNITY)
Admission: RE | Admit: 2019-06-06 | Discharge: 2019-06-06 | Disposition: A | Discharge status: Home / Self Care | Payer: 59 | Source: Ambulatory Visit | Attending: Internal Medicine | Admitting: Internal Medicine

## 2019-06-06 DIAGNOSIS — D509 Iron deficiency anemia, unspecified: Secondary | ICD-10-CM

## 2019-06-06 LAB — COMPREHENSIVE METABOLIC PANEL
ALT: 21 U/L (ref 0–44)
AST: 18 U/L (ref 15–41)
Albumin: 4.5 g/dL (ref 3.5–5.0)
Alkaline Phosphatase: 64 U/L (ref 38–126)
Anion gap: 11 (ref 5–15)
BUN: 13 mg/dL (ref 6–20)
CO2: 26 mmol/L (ref 22–32)
Calcium: 9 mg/dL (ref 8.9–10.3)
Chloride: 101 mmol/L (ref 98–111)
Creatinine, Ser: 0.7 mg/dL (ref 0.44–1.00)
GFR calc Af Amer: >60 mL/min (ref >60)
GFR calc non Af Amer: >60 mL/min (ref >60)
Glucose, Bld: 123 mg/dL — ABNORMAL HIGH (ref 70–99)
Potassium: 3.2 mmol/L — ABNORMAL LOW (ref 3.5–5.1)
Sodium: 138 mmol/L (ref 135–145)
Total Bilirubin: 0.3 mg/dL (ref 0.3–1.2)
Total Protein: 7.7 g/dL (ref 6.5–8.1)

## 2019-06-06 LAB — CBC WITH DIFFERENTIAL/PLATELET
Abs Immature Granulocytes: 0.01 10*3/uL (ref 0.00–0.07)
Basophils Absolute: 0.1 10*3/uL (ref 0.0–0.1)
Basophils Relative: 1 %
Eosinophils Absolute: 0.1 10*3/uL (ref 0.0–0.5)
Eosinophils Relative: 2 %
HCT: 40.9 % (ref 36.0–46.0)
Hemoglobin: 13.3 g/dL (ref 12.0–15.0)
Immature Granulocytes: 0 %
Lymphocytes Relative: 38 %
Lymphs Abs: 3.1 10*3/uL (ref 0.7–4.0)
MCH: 29.9 pg (ref 26.0–34.0)
MCHC: 32.5 g/dL (ref 30.0–36.0)
MCV: 91.9 fL (ref 80.0–100.0)
Monocytes Absolute: 0.4 10*3/uL (ref 0.1–1.0)
Monocytes Relative: 5 %
Neutro Abs: 4.5 10*3/uL (ref 1.7–7.7)
Neutrophils Relative %: 54 %
Platelets: 326 10*3/uL (ref 150–400)
RBC: 4.45 MIL/uL (ref 3.87–5.11)
RDW: 13.8 % (ref 11.5–15.5)
WBC: 8.2 10*3/uL (ref 4.0–10.5)
nRBC: 0 % (ref 0.0–0.2)

## 2019-06-06 LAB — IRON AND TIBC
Iron: 60 ug/dL (ref 28–170)
Saturation Ratios: 15 % (ref 10.4–31.8)
TIBC: 410 ug/dL (ref 250–450)
UIBC: 350 ug/dL

## 2019-06-06 LAB — LACTATE DEHYDROGENASE: LDH: 160 U/L (ref 98–192)

## 2019-06-06 LAB — FOLATE: Folate: 13.4 ng/mL (ref >5.9)

## 2019-06-06 LAB — VITAMIN B12: Vitamin B-12: 367 pg/mL (ref 180–914)

## 2019-06-06 LAB — FERRITIN: Ferritin: 32 ng/mL (ref 11–307)

## 2019-06-06 LAB — VITAMIN D 25 HYDROXY (VIT D DEFICIENCY, FRACTURES): Vit D, 25-Hydroxy: 25.77 ng/mL — ABNORMAL LOW (ref 30–100)

## 2019-06-07 ENCOUNTER — Encounter (HOSPITAL_COMMUNITY): Payer: Self-pay | Admitting: Nurse Practitioner

## 2019-06-07 LAB — GLIADIN ANTIBODIES, SERUM
Antigliadin Abs, IgA: 5 units (ref 0–19)
Gliadin IgG: 2 units (ref 0–19)

## 2019-06-07 LAB — TISSUE TRANSGLUTAMINASE, IGA: Tissue Transglutaminase Ab, IgA: <2 U/mL (ref 0–3)

## 2019-06-08 LAB — RETICULIN ANTIBODIES, IGA W TITER: Reticulin Ab, IgA: NEGATIVE titer (ref <2.5)

## 2019-06-09 ENCOUNTER — Inpatient Hospital Stay (HOSPITAL_BASED_OUTPATIENT_CLINIC_OR_DEPARTMENT_OTHER): Payer: 59 | Admitting: Nurse Practitioner

## 2019-06-09 ENCOUNTER — Encounter (HOSPITAL_COMMUNITY): Payer: Self-pay | Admitting: Nurse Practitioner

## 2019-06-09 DIAGNOSIS — Z79899 Other long term (current) drug therapy: Secondary | ICD-10-CM | POA: Diagnosis not present

## 2019-06-09 DIAGNOSIS — D509 Iron deficiency anemia, unspecified: Secondary | ICD-10-CM

## 2019-06-09 DIAGNOSIS — K5900 Constipation, unspecified: Secondary | ICD-10-CM

## 2019-06-09 NOTE — Progress Notes (Signed)
[facility] [address] [place], [place] [address]   CLINIC:  Medical Oncology/Hematology  PCP:  Selinda FlavinHoward, Kevin, MD [address] [place] [id number] [phone]   REASON FOR VISIT: Follow-up for microcytic anemia  CURRENT THERAPY: Intermittent iron infusions    INTERVAL HISTORY:  Kaitlin [name] 48 y.o. female was called for a telephone interview for microcytic anemia.  Patient reports she has been doing well since her last visit.  She reports no increase in energy with iron infusions however her ice pica has stopped.  She denies any easy bruising or bleeding.  She recently saw GI and was tested for celiac disease which was negative. Denies any nausea, vomiting, or diarrhea. Denies any new pains. Had not noticed any recent bleeding such as epistaxis, hematuria or hematochezia. Denies recent chest pain on exertion, shortness of breath on minimal exertion, pre-syncopal episodes, or palpitations. Denies any numbness or tingling in hands or feet. Denies any recent fevers, infections, or recent hospitalizations. Patient reports appetite at 100% and energy level at 50%.  She is eating well maintaining her weight at this time.     REVIEW OF SYSTEMS:  Review of Systems  Constitutional: Positive for fatigue.  All other systems reviewed and are negative.    PAST MEDICAL/SURGICAL HISTORY:  Past Medical History:  Diagnosis Date  . Acid reflux   . Anemia    low ferritin, dextran infusion once per yr.   . Hypertension   . Pneumonia 2010   not hospitalized  . Potassium (K) deficiency 04/2013   [facility]  [facility] - for (R)arm pain , ekg, done & on labs noted  decreased K+  . Sciatica    R leg  . Sinus infection Nov. 2014   now resolve, tx- /w Zpak   Past Surgical History:  Procedure Laterality Date  . ANTERIOR CERVICAL DECOMP/DISCECTOMY FUSION N/A 06/12/2013   Procedure: ANTERIOR CERVICAL DECOMPRESSION/DISCECTOMY FUSION 1 LEVEL;  Surgeon: Barnett AbuHenry Elsner, MD;   [facility]: [facility];  Service: Neurosurgery;  Laterality: N/A;  C5-6 Anterior cervical decompression/diskectomy/fusion  . APPENDECTOMY  2012  . BREAST SURGERY  2014   biopsy- R  . COLONOSCOPY W/ POLYPECTOMY  2012  . DIAGNOSTIC LAPAROSCOPY  2002  . EYE SURGERY  2003   lasik  . FLEXIBLE SIGMOIDOSCOPY N/A 04/27/2018   Procedure: FLEXIBLE SIGMOIDOSCOPY;  Surgeon: Malissa Hippoehman, Najeeb U, MD;  Location: [facility];  Service: Endoscopy;  Laterality: N/A;  8:30  . FOOT SURGERY Right 2005   Bunionectomy     SOCIAL HISTORY:  Social History   Socioeconomic History  . Marital status: Married    Spouse name: Not on file  . Number of children: Not on file  . Years of education: Not on file  . Highest education level: Not on file  Occupational History  . Not on file  Tobacco Use  . Smoking status: Never Smoker  . Smokeless tobacco: Never Used  Substance and Sexual Activity  . Alcohol use: No  . Drug use: No  . Sexual activity: Not on file  Other Topics Concern  . Not on file  Social History Narrative  . Not on file   Social Determinants of Health   Financial Resource Strain:   . Difficulty of Paying Living Expenses: Not on file  Food Insecurity:   . Worried About Programme researcher, broadcasting/film/videounning Out of Food in the Last Year: Not on file  . Ran Out of Food in the Last Year: Not on  file  Transportation Needs:   . Film/video editor (Medical): Not on file  . Lack of Transportation (Non-Medical): Not on file  Physical Activity:   . Days of Exercise per Week: Not on file  . Minutes of Exercise per Session: Not on file  Stress:   . Feeling of Stress : Not on file  Social Connections:   . Frequency of Communication with Friends and Family: Not on file  . Frequency of Social Gatherings with Friends and Family: Not on file  . Attends Religious Services: Not on file  . Active Member of Clubs or Organizations: Not on file  . Attends Archivist Meetings: Not on file  . Marital Status: Not on  file  Intimate Partner Violence:   . Fear of Current or Ex-Partner: Not on file  . Emotionally Abused: Not on file  . Physically Abused: Not on file  . Sexually Abused: Not on file    FAMILY HISTORY:  Family History  Problem Relation Age of Onset  . Prostate cancer Father   . Hypertension Brother     CURRENT MEDICATIONS:  Outpatient Encounter Medications as of 06/09/2019  Medication Sig Note  . AMITIZA 24 MCG capsule TAKE 1 CAPSULE BY MOUTH TWICE DAILY WITH A MEAL 05/16/2019: Patient is taking 1 a day.  . Black Cohosh 40 MG CAPS Take 40 mg by mouth at bedtime.   . cetirizine (ZYRTEC) 10 MG tablet Take 10 mg by mouth every evening.    . CHOLECALCIFEROL PO Take 5,000 Int'l Units by mouth daily.   . cyclobenzaprine (FLEXERIL) 10 MG tablet Take 10 mg by mouth 3 (three) times daily as needed for muscle spasms.    . famotidine (PEPCID) 20 MG tablet Take 1 tablet (20 mg total) by mouth at bedtime as needed for heartburn or indigestion. (Patient taking differently: Take 20 mg by mouth at bedtime. )   . fluticasone (FLONASE) 50 MCG/ACT nasal spray Place 2 sprays into both nostrils daily.    Marland Kitchen gabapentin (NEURONTIN) 300 MG capsule Take 300-600 mg by mouth 3 (three) times daily as needed (for pain).  05/16/2019: Patient states that she is taking 1 daily.  . hydrochlorothiazide (HYDRODIURIL) 25 MG tablet Take 25 mg by mouth daily before breakfast.   . NON FORMULARY 2 capsules at bedtime. Apple cidar vinegar   . Omega-3 Fatty Acids (FISH OIL) 1000 MG CAPS Take 1,000 mg by mouth at bedtime.   Marland Kitchen omeprazole (PRILOSEC) 20 MG capsule Take 20 mg by mouth daily.    No facility-administered encounter medications on file as of 06/09/2019.    ALLERGIES:  Allergies  Allergen Reactions  . Erythromycin Itching  . Hydrocodone Itching     Vital signs: -Deferred due to telephone visit  Physical Exam -Deferred due to telephone visit -Patient is alert and oriented over the the phone and in no acute  distress.  LABORATORY DATA:  I have reviewed the labs as listed.  CBC    Component Value Date/Time   WBC 8.2 06/06/2019 1558   RBC 4.45 06/06/2019 1558   HGB 13.3 06/06/2019 1558   HCT 40.9 06/06/2019 1558   PLT 326 06/06/2019 1558   MCV 91.9 06/06/2019 1558   MCH 29.9 06/06/2019 1558   MCHC 32.5 06/06/2019 1558   RDW 13.8 06/06/2019 1558   LYMPHSABS 3.1 06/06/2019 1558   MONOABS 0.4 06/06/2019 1558   EOSABS 0.1 06/06/2019 1558   BASOSABS 0.1 06/06/2019 1558   CMP Latest Ref Rng & Units  06/06/2019 05/05/2019 06/09/2013  Glucose 70 - 99 mg/dL 268(T) 419(Q) 96  BUN 6 - 20 mg/dL 13 18 15   Creatinine 0.44 - 1.00 mg/dL 2.22 9.79  Sodium 135 - 145 mmol/L 138 137 140  Potassium 3.5 - 5.1 mmol/L 3.2(L) 3.9 4.0  Chloride 98 - 111 mmol/L 101 101 103  CO2 22 - 32 mmol/L 26 27 28   Calcium 8.9 - 10.3 mg/dL 9.0 8.92) 9.5  Total Protein 6.5 - 8.1 g/dL 7.7 7.3 -  Total Bilirubin 0.3 - 1.2 mg/dL 0.3 0.4 -  Alkaline Phos 38 - 126 U/L 64 71 -  AST 15 - 41 U/L 18 14(L) -  ALT 0 - 44 U/L 21 17 -    All questions were answered to patient's stated satisfaction. Encouraged patient to call with any new concerns or questions before his next visit to the cancer center and we can certain see him sooner, if needed.      ASSESSMENT & PLAN:   Microcytic anemia 1. Microcytic anemia: -She has a history of iron deficiency anemia for several years now.  She stopped menstruating in April 2020, and had minor bleeding in 01/29/2019. 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 -She has taken iron pills in the past which has resulted in severe constipation. -Colonoscopy on 04/27/2018 showed normal colon, external hemorrhoids. -She has ice pica when her iron is low. -She had a work-up which involved folic acid and B12 were normal.  SPEP was negative.  Stool for occult blood x3 was negative. -She has received benefit in the past and has already seen improvement on her ice pica. -She last  received 5 infusions of Venofer from 02/20/2019-03/20/2019. -She reports since the iron infusion she has been having periods every 2 weeks with bleeding 3 to 4 days and sometimes just spotting. -Labs done on  showed hemoglobin 13.3, ferritin 32, percent saturation 15 -Patient was recently seen by GI and tested for celiac's disease which was negative. -We will recheck labs in 3 months with a telephone interview.  2.  Constipation: -She takes Benefiber at nighttime. -She also takes Amitiza 24 mcg twice daily.       Orders placed this encounter:  Orders Placed This Encounter  Procedures  . Lactate dehydrogenase  . CBC with Differential/Platelet  . Comprehensive metabolic panel  . Ferritin  . Iron and TIBC  . Vitamin B12  . Vitamin D 25 hydroxy  . Folate      02/22/2019, FNP-C [facility] [phone]

## 2019-06-09 NOTE — Assessment & Plan Note (Addendum)
1. Microcytic anemia: -She has a history of iron deficiency anemia for several years now.  She stopped menstruating in April 2020, and had minor bleeding in 01/29/2019. 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 -She has taken iron pills in the past which has resulted in severe constipation. -Colonoscopy on 04/27/2018 showed normal colon, external hemorrhoids. -She has ice pica when her iron is low. -She had a work-up which involved folic acid and Z32 were normal.  SPEP was negative.  Stool for occult blood x3 was negative. -She has received benefit in the past and has already seen improvement on her ice pica. -She last received 5 infusions of Venofer from 02/20/2019-03/20/2019. -She reports since the iron infusion she has been having periods every 2 weeks with bleeding 3 to 4 days and sometimes just spotting. -Labs done on  showed hemoglobin 13.3, ferritin 32, percent saturation 15 -Patient was recently seen by GI and tested for celiac's disease which was negative. -We will recheck labs in 3 months with a telephone interview.  2.  Constipation: -She takes Benefiber at nighttime. -She also takes Amitiza 24 mcg twice daily.

## 2019-06-11 ENCOUNTER — Other Ambulatory Visit (INDEPENDENT_AMBULATORY_CARE_PROVIDER_SITE_OTHER): Payer: Self-pay | Admitting: Nurse Practitioner

## 2019-07-03 ENCOUNTER — Other Ambulatory Visit (HOSPITAL_COMMUNITY): Payer: 59

## 2019-07-10 ENCOUNTER — Ambulatory Visit (HOSPITAL_COMMUNITY): Payer: 59 | Admitting: Nurse Practitioner

## 2019-08-02 ENCOUNTER — Telehealth (INDEPENDENT_AMBULATORY_CARE_PROVIDER_SITE_OTHER): Payer: Self-pay | Admitting: *Deleted

## 2019-08-02 NOTE — Telephone Encounter (Signed)
Patient had been on Amitiza. Inusrnace will not cover this year. They sent recommended alternatives . Dr.Rehman chose the Linzess 145 mcg - patient will take 1 by mouth every morning before breakfast #30 with 5 refills this was called to [employer].

## 2019-08-03 ENCOUNTER — Encounter (INDEPENDENT_AMBULATORY_CARE_PROVIDER_SITE_OTHER): Payer: Self-pay

## 2019-09-04 ENCOUNTER — Other Ambulatory Visit: Payer: Self-pay

## 2019-09-04 ENCOUNTER — Inpatient Hospital Stay (HOSPITAL_COMMUNITY): Payer: PRIVATE HEALTH INSURANCE | Attending: Hematology

## 2019-09-04 DIAGNOSIS — M7989 Other specified soft tissue disorders: Secondary | ICD-10-CM | POA: Diagnosis not present

## 2019-09-04 DIAGNOSIS — I10 Essential (primary) hypertension: Secondary | ICD-10-CM | POA: Insufficient documentation

## 2019-09-04 DIAGNOSIS — Z8249 Family history of ischemic heart disease and other diseases of the circulatory system: Secondary | ICD-10-CM | POA: Insufficient documentation

## 2019-09-04 DIAGNOSIS — D509 Iron deficiency anemia, unspecified: Secondary | ICD-10-CM | POA: Diagnosis not present

## 2019-09-04 DIAGNOSIS — K219 Gastro-esophageal reflux disease without esophagitis: Secondary | ICD-10-CM | POA: Diagnosis not present

## 2019-09-04 DIAGNOSIS — Z79899 Other long term (current) drug therapy: Secondary | ICD-10-CM | POA: Diagnosis not present

## 2019-09-04 DIAGNOSIS — R232 Flushing: Secondary | ICD-10-CM | POA: Insufficient documentation

## 2019-09-04 DIAGNOSIS — Z8042 Family history of malignant neoplasm of prostate: Secondary | ICD-10-CM | POA: Diagnosis not present

## 2019-09-04 DIAGNOSIS — N951 Menopausal and female climacteric states: Secondary | ICD-10-CM | POA: Insufficient documentation

## 2019-09-04 DIAGNOSIS — R0602 Shortness of breath: Secondary | ICD-10-CM | POA: Diagnosis not present

## 2019-09-04 DIAGNOSIS — E876 Hypokalemia: Secondary | ICD-10-CM

## 2019-09-04 LAB — COMPREHENSIVE METABOLIC PANEL
ALT: 24 U/L (ref 0–44)
AST: 23 U/L (ref 15–41)
Albumin: 4.5 g/dL (ref 3.5–5.0)
Alkaline Phosphatase: 68 U/L (ref 38–126)
Anion gap: 10 (ref 5–15)
BUN: 12 mg/dL (ref 6–20)
CO2: 30 mmol/L (ref 22–32)
Calcium: 9.2 mg/dL (ref 8.9–10.3)
Chloride: 98 mmol/L (ref 98–111)
Creatinine, Ser: 0.77 mg/dL (ref 0.44–1.00)
GFR calc Af Amer: >60 mL/min (ref >60)
GFR calc non Af Amer: >60 mL/min (ref >60)
Glucose, Bld: 92 mg/dL (ref 70–99)
Potassium: 2.6 mmol/L — CL (ref 3.5–5.1)
Sodium: 138 mmol/L (ref 135–145)
Total Bilirubin: 0.7 mg/dL (ref 0.3–1.2)
Total Protein: 7.7 g/dL (ref 6.5–8.1)

## 2019-09-04 LAB — CBC WITH DIFFERENTIAL/PLATELET
Abs Immature Granulocytes: 0.02 10*3/uL (ref 0.00–0.07)
Basophils Absolute: 0.1 10*3/uL (ref 0.0–0.1)
Basophils Relative: 1 %
Eosinophils Absolute: 0.1 10*3/uL (ref 0.0–0.5)
Eosinophils Relative: 1 %
HCT: 38.8 % (ref 36.0–46.0)
Hemoglobin: 13.4 g/dL (ref 12.0–15.0)
Immature Granulocytes: 0 %
Lymphocytes Relative: 43 %
Lymphs Abs: 3.5 10*3/uL (ref 0.7–4.0)
MCH: 31.5 pg (ref 26.0–34.0)
MCHC: 34.5 g/dL (ref 30.0–36.0)
MCV: 91.1 fL (ref 80.0–100.0)
Monocytes Absolute: 0.5 10*3/uL (ref 0.1–1.0)
Monocytes Relative: 6 %
Neutro Abs: 4.1 10*3/uL (ref 1.7–7.7)
Neutrophils Relative %: 49 %
Platelets: 332 10*3/uL (ref 150–400)
RBC: 4.26 MIL/uL (ref 3.87–5.11)
RDW: 11.9 % (ref 11.5–15.5)
WBC: 8.3 10*3/uL (ref 4.0–10.5)
nRBC: 0 % (ref 0.0–0.2)

## 2019-09-04 LAB — IRON AND TIBC
Iron: 57 ug/dL (ref 28–170)
Saturation Ratios: 14 % (ref 10.4–31.8)
TIBC: 409 ug/dL (ref 250–450)
UIBC: 352 ug/dL

## 2019-09-04 LAB — FERRITIN: Ferritin: 16 ng/mL (ref 11–307)

## 2019-09-04 LAB — VITAMIN D 25 HYDROXY (VIT D DEFICIENCY, FRACTURES): Vit D, 25-Hydroxy: 45.21 ng/mL (ref 30–100)

## 2019-09-04 LAB — LACTATE DEHYDROGENASE: LDH: 169 U/L (ref 98–192)

## 2019-09-04 LAB — VITAMIN B12: Vitamin B-12: 643 pg/mL (ref 180–914)

## 2019-09-04 LAB — FOLATE: Folate: 20.1 ng/mL (ref >5.9)

## 2019-09-04 MED ORDER — POTASSIUM CHLORIDE CRYS ER 20 MEQ PO TBCR: EXTENDED_RELEASE_TABLET | ORAL | 0 refills | Status: DC

## 2019-09-04 NOTE — Progress Notes (Unsigned)
CRITICAL VALUE ALERT Critical value received:  K 2.6 Date of notification:  09/04/19 Time of notification: 1645 Critical value read back:  Yes.   Nurse who received alert:  Tomasita Morrow RN Dr. Kirtland Bouchard notified. Orders received for K+ pills to be taken at home. Patient notified and verbalized understanding. K+ called into [facility]. She will return tomorrow morning @ 0730 for a K+ level.

## 2019-09-05 ENCOUNTER — Inpatient Hospital Stay (HOSPITAL_COMMUNITY): Payer: PRIVATE HEALTH INSURANCE

## 2019-09-05 ENCOUNTER — Encounter (HOSPITAL_COMMUNITY): Payer: Self-pay

## 2019-09-05 DIAGNOSIS — D509 Iron deficiency anemia, unspecified: Secondary | ICD-10-CM | POA: Diagnosis not present

## 2019-09-05 DIAGNOSIS — E876 Hypokalemia: Secondary | ICD-10-CM

## 2019-09-05 LAB — POTASSIUM: Potassium: 3.8 mmol/L (ref 3.5–5.1)

## 2019-09-05 NOTE — Progress Notes (Signed)
Labs reviewed with Dr. Ellin Saba.  Patient ok to leave and start taking Potassium daily verbal order Dr. Ellin Saba.  Patient verbalized understanding.

## 2019-09-06 ENCOUNTER — Other Ambulatory Visit: Payer: Self-pay | Admitting: Obstetrics and Gynecology

## 2019-09-11 ENCOUNTER — Inpatient Hospital Stay (HOSPITAL_BASED_OUTPATIENT_CLINIC_OR_DEPARTMENT_OTHER): Payer: PRIVATE HEALTH INSURANCE | Admitting: Hematology

## 2019-09-11 ENCOUNTER — Other Ambulatory Visit: Payer: Self-pay

## 2019-09-11 ENCOUNTER — Encounter (HOSPITAL_COMMUNITY): Payer: Self-pay | Admitting: Hematology

## 2019-09-11 VITALS — BP 144/93 | HR 87 | Temp 97.1°F | Resp 19 | Wt 224.8 lb

## 2019-09-11 DIAGNOSIS — D509 Iron deficiency anemia, unspecified: Secondary | ICD-10-CM

## 2019-09-11 NOTE — Progress Notes (Signed)
[place], [place] [address]   CLINIC:  Medical Oncology/Hematology  PCP:  Rory Percy, MD [place] [address] [phone]   REASON FOR VISIT: Follow-up for microcytic anemia  CURRENT THERAPY: Intermittent iron infusions    INTERVAL HISTORY:  Kaitlin [name] 49 y.o. female seen for follow-up of iron deficiency state.  Last Venofer infusion was on 03/20/2019.  Reports appetite 100% and energy levels 50%.  Reports swelling in the legs which is new.  She is taking Lasix 40 mg prescribed by her PMD.  Reports that she started having menstruation again in August although irregular.  Last menstrual period was sometime in February.  She met with her GYN in [place] and is having ablation done next week.    REVIEW OF SYSTEMS:  Review of Systems  Respiratory: Positive for shortness of breath.   Neurological: Positive for dizziness and numbness.  Psychiatric/Behavioral: The patient is nervous/anxious.   All other systems reviewed and are negative.    PAST MEDICAL/SURGICAL HISTORY:  Past Medical History:  Diagnosis Date  . Acid reflux   . Anemia    low ferritin, dextran infusion once per yr.   . Hypertension   . Pneumonia 2010   not hospitalized  . Potassium (K) deficiency 04/2013   ED visit to [facility]  04/2013 - for (R)arm pain , ekg, done & on labs noted  decreased K+  . Sciatica    R leg  . Sinus infection Nov. 2014   now resolve, tx- /w Zpak   Past Surgical History:  Procedure Laterality Date  . ANTERIOR CERVICAL DECOMP/DISCECTOMY FUSION N/A 06/12/2013   Procedure: ANTERIOR CERVICAL DECOMPRESSION/DISCECTOMY FUSION 1 LEVEL;  Surgeon: Kristeen Miss, MD;  Location: [facility] NEURO ORS;  Service: Neurosurgery;  Laterality: N/A;  C5-6 Anterior cervical decompression/diskectomy/fusion  . APPENDECTOMY  2012  . BREAST SURGERY  2014   biopsy- R  . COLONOSCOPY W/ POLYPECTOMY  2012  . DIAGNOSTIC LAPAROSCOPY  2002  . EYE SURGERY  2003    lasik  . FLEXIBLE SIGMOIDOSCOPY N/A 04/27/2018   Procedure: FLEXIBLE SIGMOIDOSCOPY;  Surgeon: Rogene Houston, MD;  Location: [facility];  Service: Endoscopy;  Laterality: N/A;  8:30  . FOOT SURGERY Right 2005   Bunionectomy     SOCIAL HISTORY:  Social History   Socioeconomic History  . Marital status: Married    Spouse name: Not on file  . Number of children: Not on file  . Years of education: Not on file  . Highest education level: Not on file  Occupational History  . Not on file  Tobacco Use  . Smoking status: Never Smoker  . Smokeless tobacco: Never Used  Substance and Sexual Activity  . Alcohol use: No  . Drug use: No  . Sexual activity: Not on file  Other Topics Concern  . Not on file  Social History Narrative  . Not on file   Social Determinants of Health   Financial Resource Strain:   . Difficulty of Paying Living Expenses:   Food Insecurity:   . Worried About Charity fundraiser in the Last Year:   . Arboriculturist in the Last Year:   Transportation Needs:   . Film/video editor (Medical):   Marland Kitchen Lack of Transportation (Non-Medical):   Physical Activity:   . Days of Exercise per Week:   . Minutes of Exercise per Session:   Stress:   . Feeling of Stress :  Social Connections:   . Frequency of Communication with Friends and Family:   . Frequency of Social Gatherings with Friends and Family:   . Attends Religious Services:   . Active Member of Clubs or Organizations:   . Attends Archivist Meetings:   Marland Kitchen Marital Status:   Intimate Partner Violence:   . Fear of Current or Ex-Partner:   . Emotionally Abused:   Marland Kitchen Physically Abused:   . Sexually Abused:     FAMILY HISTORY:  Family History  Problem Relation Age of Onset  . Prostate cancer Father   . Hypertension Brother     CURRENT MEDICATIONS:  Outpatient Encounter Medications as of 09/11/2019  Medication Sig Note  . AMITIZA 24 MCG capsule TAKE 1 CAPSULE BY MOUTH TWICE DAILY  WITH A MEAL   . cetirizine (ZYRTEC) 10 MG tablet Take 10 mg by mouth every evening.    . CHOLECALCIFEROL PO Take 5,000 Int'l Units by mouth daily.   . famotidine (PEPCID) 20 MG tablet Take 1 tablet (20 mg total) by mouth at bedtime as needed for heartburn or indigestion. (Patient taking differently: Take 20 mg by mouth at bedtime. )   . fluticasone (FLONASE) 50 MCG/ACT nasal spray Place 2 sprays into both nostrils daily.    Marland Kitchen gabapentin (NEURONTIN) 300 MG capsule Take 300-600 mg by mouth 3 (three) times daily as needed (for pain).  05/16/2019: Patient states that she is taking 1 daily.  . Omega-3 Fatty Acids (FISH OIL) 1000 MG CAPS Take 1,000 mg by mouth at bedtime.   Marland Kitchen omeprazole (PRILOSEC) 20 MG capsule Take 20 mg by mouth daily.   . potassium chloride SA (KLOR-CON) 20 MEQ tablet Take 2 tablets every 2 hours x 3 doses (6pm, 8pm, 10pm). Total of 134mq before bed. Then take 2 tablets at 8am on 09/05/19.   .Marland Kitchencyclobenzaprine (FLEXERIL) 10 MG tablet Take 10 mg by mouth 3 (three) times daily as needed for muscle spasms.    . furosemide (LASIX) 40 MG tablet Take 40 mg by mouth daily.   . [DISCONTINUED] Black Cohosh 40 MG CAPS Take 40 mg by mouth at bedtime.   . [DISCONTINUED] hydrochlorothiazide (HYDRODIURIL) 25 MG tablet Take 25 mg by mouth daily before breakfast.   . [DISCONTINUED] NON FORMULARY 2 capsules at bedtime. Apple cidar vinegar    No facility-administered encounter medications on file as of 09/11/2019.    ALLERGIES:  Allergies  Allergen Reactions  . Erythromycin Itching  . Hydrocodone Itching     Vital signs: Blood pressure 144/93.  Pulse rate is 87 respiratory rate is 19 oxygen saturation is 99%.  Temperature is 97.  Physical Exam Vitals reviewed.  Constitutional:      Appearance: Normal appearance.  Cardiovascular:     Rate and Rhythm: Normal rate and regular rhythm.     Heart sounds: Normal heart sounds.  Pulmonary:     Effort: Pulmonary effort is normal.     Breath  sounds: Normal breath sounds.  Abdominal:     General: There is no distension.     Palpations: Abdomen is soft. There is no mass.  Musculoskeletal:     Right lower leg: Edema present.     Left lower leg: Edema present.  Neurological:     General: No focal deficit present.     Mental Status: She is alert and oriented to person, place, and time.  Psychiatric:        Mood and Affect: Mood normal.  Behavior: Behavior normal.      LABORATORY DATA:  I have reviewed the labs as listed.  CBC    Component Value Date/Time   WBC 8.3 09/04/2019 1549   RBC 4.26 09/04/2019 1549   HGB 13.4 09/04/2019 1549   HCT 38.8 09/04/2019 1549   PLT 332 09/04/2019 1549   MCV 91.1 09/04/2019 1549   MCH 31.5 09/04/2019 1549   MCHC 34.5 09/04/2019 1549   RDW 11.9 09/04/2019 1549   LYMPHSABS 3.5 09/04/2019 1549   MONOABS 0.5 09/04/2019 1549   EOSABS 0.1 09/04/2019 1549   BASOSABS 0.1 09/04/2019 1549   CMP Latest Ref Rng & Units 09/05/2019 09/04/2019 06/06/2019  Glucose 70 - 99 mg/dL - 92 123(H)  BUN 6 - 20 mg/dL - 12 13  Creatinine 0.44 - 1.00 mg/dL - 0.77 0.70  Sodium 135 - 145 mmol/L - 138 138  Potassium 3.5 - 5.1 mmol/L 3.8 2.6(LL) 3.2(L)  Chloride 98 - 111 mmol/L - 98 101  CO2 22 - 32 mmol/L - 30 26  Calcium 8.9 - 10.3 mg/dL - 9.2 9.0  Total Protein 6.5 - 8.1 g/dL - 7.7 7.7  Total Bilirubin 0.3 - 1.2 mg/dL - 0.7 0.3  Alkaline Phos 38 - 126 U/L - 68 64  AST 15 - 41 U/L - 23 18  ALT 0 - 44 U/L - 24 21        ASSESSMENT & PLAN:   Microcytic anemia 1.  Iron deficiency state: -She has iron deficiency state secondary to menstrual blood loss. -She cannot tolerate iron pills because of severe constipation. -Colonoscopy on 04/27/2018 shows normal colon, external hemorrhoids. -She received 5 infusions of Venofer from 02/20/2019 through 03/20/2019. -She has experienced improvement in energy after the iron infusions.  However she started having menses again in August last year. -We reviewed  labs from 09/04/2019.  Ferritin decreased to 16 from 32 in December 2020.  Percent saturation is 14.  O16 and folic acid were normal.  Hemoglobin was 13.4. -She is having ablation procedure done next week for her bleeding.  We discussed the options of watchful waiting versus iron therapy again.  Patient is not very tired enough to receive iron therapy at this time.  Will check ferritin iron panel CBC in 3 months.  2.  Lower extremity swelling: -She reported lower extremity swelling which is recent onset.  Albumin is 4.5. -She was reportedly started on Lasix 40 mg daily.  Her potassium dropped down to 2.6.  We have repeated it and the repeat potassium was 3.8. -She does not report increase in urination even when she takes Lasix 40 mg.  She was told to talk to her PMD about it.  3.  Hot flashes: -She will continue black cohosh for hot flashes.      Orders placed this encounter:  Orders Placed This Encounter  Procedures  . CBC with Differential/Platelet  . Iron and TIBC  . Ferritin      Francene Finders, FNP-C [place] [phone]

## 2019-09-11 NOTE — Assessment & Plan Note (Signed)
1.  Iron deficiency state: -She has iron deficiency state secondary to menstrual blood loss. -She cannot tolerate iron pills because of severe constipation. -Colonoscopy on 04/27/2018 shows normal colon, external hemorrhoids. -She received 5 infusions of Venofer from 02/20/2019 through 03/20/2019. -She has experienced improvement in energy after the iron infusions.  However she started having menses again in August last year. -We reviewed labs from 09/04/2019.  Ferritin decreased to 16 from 32 in December 2020.  Percent saturation is 14.  B12 and folic acid were normal.  Hemoglobin was 13.4. -She is having ablation procedure done next week for her bleeding.  We discussed the options of watchful waiting versus iron therapy again.  Patient is not very tired enough to receive iron therapy at this time.  Will check ferritin iron panel CBC in 3 months.  2.  Lower extremity swelling: -She reported lower extremity swelling which is recent onset.  Albumin is 4.5. -She was reportedly started on Lasix 40 mg daily.  Her potassium dropped down to 2.6.  We have repeated it and the repeat potassium was 3.8. -She does not report increase in urination even when she takes Lasix 40 mg.  She was told to talk to her PMD about it.  3.  Hot flashes: -She will continue black cohosh for hot flashes.

## 2019-09-11 NOTE — Patient Instructions (Addendum)
[facility] at [facility] Discharge Instructions  You were seen today by Dr. Ellin Saba. He went over your recent lab results. Follow up with your PCP about an ECHO to further evaluate the swelling in your legs. He will see you back in 3 months for labs and follow up.   Thank you for choosing [facility] at [facility] to provide your oncology and hematology care.  To afford each patient quality time with our provider, please arrive at least 15 minutes before your scheduled appointment time.   If you have a lab appointment with the Cancer Center please come in thru the  Main Entrance and check in at the main information desk  You need to re-schedule your appointment should you arrive 10 or more minutes late.  We strive to give you quality time with our providers, and arriving late affects you and other patients whose appointments are after yours.  Also, if you no show three or more times for appointments you may be dismissed from the clinic at the providers discretion.     Again, thank you for choosing [facility].  Our hope is that these requests will decrease the amount of time that you wait before being seen by our physicians.       _____________________________________________________________  Should you have questions after your visit to [facility], please contact our office at [phone] between the hours of 8:00 a.m. and 4:30 p.m.  Voicemails left after 4:00 p.m. will not be returned until the following business day.  For prescription refill requests, have your pharmacy contact our office and allow 72 hours.    Cancer Center Support Programs:   > Cancer Support Group  2nd Tuesday of the month 1pm-2pm, Journey Room

## 2019-09-12 ENCOUNTER — Encounter (HOSPITAL_BASED_OUTPATIENT_CLINIC_OR_DEPARTMENT_OTHER): Payer: Self-pay | Admitting: Obstetrics and Gynecology

## 2019-09-13 ENCOUNTER — Other Ambulatory Visit: Payer: Self-pay

## 2019-09-13 ENCOUNTER — Encounter (HOSPITAL_BASED_OUTPATIENT_CLINIC_OR_DEPARTMENT_OTHER): Payer: Self-pay | Admitting: Obstetrics and Gynecology

## 2019-09-13 NOTE — Progress Notes (Addendum)
Spoke w/ via phone for pre-op interview--- PT Lab needs dos---Serum Preg, T&S, EKG          Lab results---- pt getting CBC,CMP done @ her pcp, Dr Selinda Flavin , office today or tomorrow and pt will have results faxed to [facility] #[phone] COVID test ------ 09-15-2019 @ 1455 @ [facility] Arrive at ------- 0830 NPO after ------ MN Medications to take morning of surgery ----- Prilosec, Flonase spray w/ sips of water Diabetic medication ----- n/a Patient Special Instructions ----- n/a Pre-Op special Istructions ----- n/a Patient verbalized understanding of instructions that were given at this phone interview. Patient denies shortness of breath, chest pain, fever, cough a this phone interview.

## 2019-09-15 ENCOUNTER — Other Ambulatory Visit (HOSPITAL_COMMUNITY)
Admission: RE | Admit: 2019-09-15 | Discharge: 2019-09-15 | Disposition: A | Discharge status: Home / Self Care | Payer: PRIVATE HEALTH INSURANCE | Source: Ambulatory Visit | Attending: Obstetrics and Gynecology | Admitting: Obstetrics and Gynecology

## 2019-09-15 ENCOUNTER — Other Ambulatory Visit: Payer: Self-pay

## 2019-09-15 DIAGNOSIS — Z01812 Encounter for preprocedural laboratory examination: Secondary | ICD-10-CM | POA: Diagnosis not present

## 2019-09-15 DIAGNOSIS — Z20822 Contact with and (suspected) exposure to covid-19: Secondary | ICD-10-CM | POA: Diagnosis not present

## 2019-09-16 LAB — SARS CORONAVIRUS 2 (TAT 6-24 HRS): SARS Coronavirus 2: NEGATIVE

## 2019-09-18 NOTE — Progress Notes (Signed)
Spoke with patient by phone and made aware lab results not yet faxed. Patient to call office and get labs faxed.

## 2019-09-18 NOTE — Anesthesia Preprocedure Evaluation (Addendum)
Anesthesia Evaluation  Patient identified by MRN, date of birth, ID band Patient awake    Reviewed: Allergy & Precautions, H&P , NPO status , Patient's Chart, lab work & pertinent test results  History of Anesthesia Complications (+) PONV, Family history of anesthesia reaction and history of anesthetic complications  Airway Mallampati: II  TM Distance: >3 FB Neck ROM: Full    Dental no notable dental hx. (+) Teeth Intact, Dental Advisory Given   Pulmonary neg pulmonary ROS,    Pulmonary exam normal breath sounds clear to auscultation       Cardiovascular hypertension, Pt. on medications Normal cardiovascular exam Rhythm:Regular Rate:Normal     Neuro/Psych S/p ACDF 2014  C-spine cleared negative psych ROS   GI/Hepatic Neg liver ROS, GERD  Medicated and Controlled,IBS, chronic constipation, hemorrhoids    Endo/Other  Obesity BMI 36  Renal/GU negative Renal ROS bladder dysfunction Female GU complaint Pelvic pain, menorrhagia     Musculoskeletal  (+) Arthritis , Osteoarthritis,  Right sciatica   Abdominal (+) + obese,   Peds  Hematology  (+) Blood dyscrasia, anemia , Anemia 2/2 AUB- last Hct 38.8   Anesthesia Other Findings Chronic hypokalemia- on supplements PO  Reproductive/Obstetrics negative OB ROS                           Anesthesia Physical Anesthesia Plan  ASA: III  Anesthesia Plan: General   Post-op Pain Management:    Induction: Intravenous  PONV Risk Score and Plan: 4 or greater and Ondansetron, Dexamethasone, Midazolam, Scopolamine patch - Pre-op and Treatment may vary due to age or medical condition  Airway Management Planned: Oral ETT and Video Laryngoscope Planned  Additional Equipment: None  Intra-op Plan:   Post-operative Plan: Extubation in OR  Informed Consent: I have reviewed the patients History and Physical, chart, labs and discussed the procedure  including the risks, benefits and alternatives for the proposed anesthesia with the patient or authorized representative who has indicated his/her understanding and acceptance.     Dental advisory given  Plan Discussed with: CRNA  Anesthesia Plan Comments:        Anesthesia Quick Evaluation

## 2019-09-18 NOTE — Progress Notes (Signed)
No labs received from patient's pcp office, I stat 8 ordered for day of surgery.

## 2019-09-19 ENCOUNTER — Ambulatory Visit (HOSPITAL_BASED_OUTPATIENT_CLINIC_OR_DEPARTMENT_OTHER): Payer: PRIVATE HEALTH INSURANCE | Admitting: Anesthesiology

## 2019-09-19 ENCOUNTER — Encounter (HOSPITAL_BASED_OUTPATIENT_CLINIC_OR_DEPARTMENT_OTHER)
Admission: RE | Disposition: A | Discharge status: Home / Self Care | Payer: Self-pay | Source: Ambulatory Visit | Attending: Obstetrics and Gynecology

## 2019-09-19 ENCOUNTER — Other Ambulatory Visit: Payer: Self-pay

## 2019-09-19 ENCOUNTER — Encounter (HOSPITAL_BASED_OUTPATIENT_CLINIC_OR_DEPARTMENT_OTHER): Payer: Self-pay | Admitting: Obstetrics and Gynecology

## 2019-09-19 ENCOUNTER — Ambulatory Visit (HOSPITAL_BASED_OUTPATIENT_CLINIC_OR_DEPARTMENT_OTHER)
Admission: RE | Admit: 2019-09-19 | Discharge: 2019-09-19 | Disposition: A | Discharge status: Home / Self Care | Payer: PRIVATE HEALTH INSURANCE | Source: Ambulatory Visit | Attending: Obstetrics and Gynecology | Admitting: Obstetrics and Gynecology

## 2019-09-19 DIAGNOSIS — I10 Essential (primary) hypertension: Secondary | ICD-10-CM | POA: Diagnosis not present

## 2019-09-19 DIAGNOSIS — N920 Excessive and frequent menstruation with regular cycle: Secondary | ICD-10-CM | POA: Diagnosis not present

## 2019-09-19 DIAGNOSIS — K219 Gastro-esophageal reflux disease without esophagitis: Secondary | ICD-10-CM | POA: Diagnosis not present

## 2019-09-19 DIAGNOSIS — N736 Female pelvic peritoneal adhesions (postinfective): Secondary | ICD-10-CM | POA: Diagnosis not present

## 2019-09-19 DIAGNOSIS — E669 Obesity, unspecified: Secondary | ICD-10-CM | POA: Insufficient documentation

## 2019-09-19 DIAGNOSIS — N803 Endometriosis of pelvic peritoneum: Secondary | ICD-10-CM | POA: Diagnosis not present

## 2019-09-19 DIAGNOSIS — R102 Pelvic and perineal pain: Secondary | ICD-10-CM | POA: Insufficient documentation

## 2019-09-19 DIAGNOSIS — M1990 Unspecified osteoarthritis, unspecified site: Secondary | ICD-10-CM | POA: Insufficient documentation

## 2019-09-19 DIAGNOSIS — N840 Polyp of corpus uteri: Secondary | ICD-10-CM | POA: Insufficient documentation

## 2019-09-19 DIAGNOSIS — Z981 Arthrodesis status: Secondary | ICD-10-CM | POA: Insufficient documentation

## 2019-09-19 DIAGNOSIS — Z6836 Body mass index (BMI) 36.0-36.9, adult: Secondary | ICD-10-CM | POA: Diagnosis not present

## 2019-09-19 DIAGNOSIS — Z79899 Other long term (current) drug therapy: Secondary | ICD-10-CM | POA: Diagnosis not present

## 2019-09-19 HISTORY — DX: Iron deficiency anemia, unspecified: D50.9

## 2019-09-19 HISTORY — DX: Gastro-esophageal reflux disease without esophagitis: K21.9

## 2019-09-19 HISTORY — DX: Iron deficiency anemia secondary to blood loss (chronic): D50.0

## 2019-09-19 HISTORY — PX: DILITATION & CURRETTAGE/HYSTROSCOPY WITH NOVASURE ABLATION: SHX5568

## 2019-09-19 HISTORY — DX: Pelvic and perineal pain: R10.2

## 2019-09-19 HISTORY — DX: Nausea with vomiting, unspecified: R11.2

## 2019-09-19 HISTORY — DX: Family history of other specified conditions: Z84.89

## 2019-09-19 HISTORY — PX: ROBOTIC ASSISTED LAPAROSCOPIC LYSIS OF ADHESION: SHX6080

## 2019-09-19 HISTORY — DX: Unspecified hemorrhoids: K64.9

## 2019-09-19 HISTORY — DX: Rosacea, unspecified: L71.9

## 2019-09-19 HISTORY — DX: Sciatica, right side: M54.31

## 2019-09-19 HISTORY — DX: Irritable bowel syndrome without diarrhea: K58.9

## 2019-09-19 HISTORY — DX: Other specified postprocedural states: Z98.890

## 2019-09-19 HISTORY — DX: Personal history of other endocrine, nutritional and metabolic disease: Z86.39

## 2019-09-19 HISTORY — DX: Excessive and frequent menstruation with regular cycle: N92.0

## 2019-09-19 LAB — POCT I-STAT, CHEM 8
BUN: 20 mg/dL (ref 6–20)
Calcium, Ion: 1.29 mmol/L (ref 1.15–1.40)
Chloride: 105 mmol/L (ref 98–111)
Creatinine, Ser: 0.8 mg/dL (ref 0.44–1.00)
Glucose, Bld: 86 mg/dL (ref 70–99)
HCT: 38 % (ref 36.0–46.0)
Hemoglobin: 12.9 g/dL (ref 12.0–15.0)
Potassium: 3.6 mmol/L (ref 3.5–5.1)
Sodium: 145 mmol/L (ref 135–145)
TCO2: 29 mmol/L (ref 22–32)

## 2019-09-19 LAB — TYPE AND SCREEN
ABO/RH(D): B NEG
Antibody Screen: NEGATIVE

## 2019-09-19 LAB — CBC
HCT: 37.7 % (ref 36.0–46.0)
Hemoglobin: 12.6 g/dL (ref 12.0–15.0)
MCH: 30.8 pg (ref 26.0–34.0)
MCHC: 33.4 g/dL (ref 30.0–36.0)
MCV: 92.2 fL (ref 80.0–100.0)
Platelets: 261 10*3/uL (ref 150–400)
RBC: 4.09 MIL/uL (ref 3.87–5.11)
RDW: 11.9 % (ref 11.5–15.5)
WBC: 5.7 10*3/uL (ref 4.0–10.5)
nRBC: 0 % (ref 0.0–0.2)

## 2019-09-19 LAB — ABO/RH: ABO/RH(D): B NEG

## 2019-09-19 LAB — HCG, SERUM, QUALITATIVE: Preg, Serum: NEGATIVE

## 2019-09-19 SURGERY — LYSIS, ADHESIONS, ROBOT-ASSISTED, LAPAROSCOPIC
Anesthesia: General | Site: Vagina

## 2019-09-19 MED ORDER — WHITE PETROLATUM EX OINT
TOPICAL_OINTMENT | CUTANEOUS | Status: AC
  Filled 2019-09-19: qty 5

## 2019-09-19 MED ORDER — LIDOCAINE 2% (20 MG/ML) 5 ML SYRINGE
INTRAMUSCULAR | Status: DC | PRN
  Administered 2019-09-19: 1.5 mg/kg/h via INTRAVENOUS

## 2019-09-19 MED ORDER — DEXAMETHASONE SODIUM PHOSPHATE 10 MG/ML IJ SOLN
INTRAMUSCULAR | Status: DC | PRN
  Administered 2019-09-19: 5 mg via INTRAVENOUS

## 2019-09-19 MED ORDER — ONDANSETRON HCL 4 MG/2ML IJ SOLN
INTRAMUSCULAR | Status: DC | PRN
  Administered 2019-09-19: 4 mg via INTRAVENOUS

## 2019-09-19 MED ORDER — HYDROMORPHONE HCL 1 MG/ML IJ SOLN
INTRAMUSCULAR | Status: AC
  Filled 2019-09-19: qty 1

## 2019-09-19 MED ORDER — KETOROLAC TROMETHAMINE 30 MG/ML IJ SOLN
INTRAMUSCULAR | Status: DC | PRN
  Administered 2019-09-19: 30 mg via INTRAVENOUS

## 2019-09-19 MED ORDER — KETOROLAC TROMETHAMINE 30 MG/ML IJ SOLN
INTRAMUSCULAR | Status: AC
  Filled 2019-09-19: qty 1

## 2019-09-19 MED ORDER — PROPOFOL 10 MG/ML IV BOLUS
INTRAVENOUS | Status: AC
  Filled 2019-09-19: qty 40

## 2019-09-19 MED ORDER — DEXMEDETOMIDINE HCL IN NACL 200 MCG/50ML IV SOLN
INTRAVENOUS | Status: AC
  Filled 2019-09-19: qty 50

## 2019-09-19 MED ORDER — SODIUM CHLORIDE 0.9 % IR SOLN
Status: DC | PRN
  Administered 2019-09-19 (×2): 3000 mL

## 2019-09-19 MED ORDER — MIDAZOLAM HCL 2 MG/2ML IJ SOLN
INTRAMUSCULAR | Status: AC
  Filled 2019-09-19: qty 2

## 2019-09-19 MED ORDER — SCOPOLAMINE 1 MG/3DAYS TD PT72
MEDICATED_PATCH | TRANSDERMAL | Status: AC
  Filled 2019-09-19: qty 1

## 2019-09-19 MED ORDER — ROCURONIUM BROMIDE 10 MG/ML (PF) SYRINGE
PREFILLED_SYRINGE | INTRAVENOUS | Status: DC | PRN
  Administered 2019-09-19: 50 mg via INTRAVENOUS
  Administered 2019-09-19: 10 mg via INTRAVENOUS

## 2019-09-19 MED ORDER — LIDOCAINE 2% (20 MG/ML) 5 ML SYRINGE
INTRAMUSCULAR | Status: AC
  Filled 2019-09-19: qty 5

## 2019-09-19 MED ORDER — VASOPRESSIN 20 UNIT/ML IV SOLN
INTRAVENOUS | Status: DC | PRN
  Administered 2019-09-19: 20 mL via INTRAMUSCULAR

## 2019-09-19 MED ORDER — SUGAMMADEX SODIUM 200 MG/2ML IV SOLN
INTRAVENOUS | Status: DC | PRN
  Administered 2019-09-19: 200 mg via INTRAVENOUS

## 2019-09-19 MED ORDER — LACTATED RINGERS IV SOLN
INTRAVENOUS | Status: DC
  Filled 2019-09-19: qty 1000

## 2019-09-19 MED ORDER — OXYCODONE HCL 5 MG PO TABS
ORAL_TABLET | ORAL | Status: AC
  Filled 2019-09-19: qty 1

## 2019-09-19 MED ORDER — FENTANYL CITRATE (PF) 100 MCG/2ML IJ SOLN
INTRAMUSCULAR | Status: AC
  Filled 2019-09-19: qty 2

## 2019-09-19 MED ORDER — BUPIVACAINE HCL (PF) 0.25 % IJ SOLN
INTRAMUSCULAR | Status: DC | PRN
  Administered 2019-09-19: 20 mL

## 2019-09-19 MED ORDER — OXYCODONE HCL 5 MG PO TABS
5.0000 mg | ORAL_TABLET | Freq: Once | ORAL | Status: AC | PRN
  Administered 2019-09-19: 5 mg via ORAL
  Filled 2019-09-19: qty 1

## 2019-09-19 MED ORDER — CEFAZOLIN SODIUM-DEXTROSE 2-4 GM/100ML-% IV SOLN
2.0000 g | INTRAVENOUS | Status: AC
  Administered 2019-09-19: 2 g via INTRAVENOUS
  Filled 2019-09-19: qty 100

## 2019-09-19 MED ORDER — ACETAMINOPHEN 500 MG PO TABS
1000.0000 mg | ORAL_TABLET | Freq: Once | ORAL | Status: AC
  Administered 2019-09-19: 1000 mg via ORAL
  Filled 2019-09-19: qty 2

## 2019-09-19 MED ORDER — OXYCODONE-ACETAMINOPHEN 5-325 MG PO TABS: 1.0000 | ORAL_TABLET | ORAL | 0 refills | Status: DC | PRN

## 2019-09-19 MED ORDER — DEXMEDETOMIDINE HCL 200 MCG/2ML IV SOLN
INTRAVENOUS | Status: DC | PRN
  Administered 2019-09-19: 4 ug via INTRAVENOUS
  Administered 2019-09-19: 8 ug via INTRAVENOUS
  Administered 2019-09-19 (×2): 4 ug via INTRAVENOUS

## 2019-09-19 MED ORDER — ONDANSETRON HCL 4 MG/2ML IJ SOLN
INTRAMUSCULAR | Status: AC
  Filled 2019-09-19: qty 2

## 2019-09-19 MED ORDER — ACETAMINOPHEN 500 MG PO TABS
ORAL_TABLET | ORAL | Status: AC
  Filled 2019-09-19: qty 2

## 2019-09-19 MED ORDER — DEXAMETHASONE SODIUM PHOSPHATE 10 MG/ML IJ SOLN
INTRAMUSCULAR | Status: AC
  Filled 2019-09-19: qty 1

## 2019-09-19 MED ORDER — SCOPOLAMINE 1 MG/3DAYS TD PT72
1.0000 | MEDICATED_PATCH | TRANSDERMAL | Status: DC
  Administered 2019-09-19: 1.5 mg via TRANSDERMAL
  Filled 2019-09-19: qty 1

## 2019-09-19 MED ORDER — MEPERIDINE HCL 25 MG/ML IJ SOLN
6.2500 mg | INTRAMUSCULAR | Status: DC | PRN
  Filled 2019-09-19: qty 1

## 2019-09-19 MED ORDER — OXYCODONE HCL 5 MG/5ML PO SOLN
5.0000 mg | Freq: Once | ORAL | Status: AC | PRN
  Filled 2019-09-19: qty 5

## 2019-09-19 MED ORDER — LIDOCAINE 2% (20 MG/ML) 5 ML SYRINGE
INTRAMUSCULAR | Status: DC | PRN
  Administered 2019-09-19: 100 mg via INTRAVENOUS

## 2019-09-19 MED ORDER — PROMETHAZINE HCL 25 MG/ML IJ SOLN
6.2500 mg | INTRAMUSCULAR | Status: DC | PRN
  Filled 2019-09-19: qty 1

## 2019-09-19 MED ORDER — ROCURONIUM BROMIDE 10 MG/ML (PF) SYRINGE
PREFILLED_SYRINGE | INTRAVENOUS | Status: AC
  Filled 2019-09-19: qty 10

## 2019-09-19 MED ORDER — KETOROLAC TROMETHAMINE 30 MG/ML IJ SOLN
30.0000 mg | Freq: Once | INTRAMUSCULAR | Status: DC | PRN
  Filled 2019-09-19: qty 1

## 2019-09-19 MED ORDER — CEFAZOLIN SODIUM-DEXTROSE 2-4 GM/100ML-% IV SOLN
INTRAVENOUS | Status: AC
  Filled 2019-09-19: qty 100

## 2019-09-19 MED ORDER — HYDROMORPHONE HCL 1 MG/ML IJ SOLN
0.2500 mg | INTRAMUSCULAR | Status: DC | PRN
  Administered 2019-09-19: 0.25 mg via INTRAVENOUS
  Administered 2019-09-19 (×2): 0.5 mg via INTRAVENOUS
  Administered 2019-09-19: 0.25 mg via INTRAVENOUS
  Filled 2019-09-19: qty 0.5

## 2019-09-19 MED ORDER — FENTANYL CITRATE (PF) 100 MCG/2ML IJ SOLN
INTRAMUSCULAR | Status: DC | PRN
  Administered 2019-09-19 (×3): 50 ug via INTRAVENOUS

## 2019-09-19 MED ORDER — PROPOFOL 10 MG/ML IV BOLUS
INTRAVENOUS | Status: DC | PRN
  Administered 2019-09-19: 150 mg via INTRAVENOUS

## 2019-09-19 MED ORDER — MIDAZOLAM HCL 2 MG/2ML IJ SOLN
INTRAMUSCULAR | Status: DC | PRN
  Administered 2019-09-19: 2 mg via INTRAVENOUS

## 2019-09-19 SURGICAL SUPPLY — 67 items
ABLATOR SURESOUND NOVASURE (ABLATOR) ×4 IMPLANT
BARRIER ADHS 3X4 INTERCEED (GAUZE/BANDAGES/DRESSINGS) ×2 IMPLANT
CANISTER SUCT 3000ML PPV (MISCELLANEOUS) ×4 IMPLANT
CATH FOLEY 3WAY  5CC 16FR (CATHETERS) ×4
CATH FOLEY 3WAY 5CC 16FR (CATHETERS) ×2 IMPLANT
CATH ROBINSON RED A/P 16FR (CATHETERS) ×4 IMPLANT
COVER BACK TABLE 60X90IN (DRAPES) ×4 IMPLANT
COVER TIP SHEARS 8 DVNC (MISCELLANEOUS) ×2 IMPLANT
COVER TIP SHEARS 8MM DA VINCI (MISCELLANEOUS) ×4
COVER WAND RF STERILE (DRAPES) ×4 IMPLANT
DECANTER SPIKE VIAL GLASS SM (MISCELLANEOUS) ×8 IMPLANT
DEFOGGER SCOPE WARMER CLEARIFY (MISCELLANEOUS) ×4 IMPLANT
DERMABOND ADVANCED (GAUZE/BANDAGES/DRESSINGS) ×2
DERMABOND ADVANCED .7 DNX12 (GAUZE/BANDAGES/DRESSINGS) ×2 IMPLANT
DRAPE ARM DVNC X/XI (DISPOSABLE) ×8 IMPLANT
DRAPE COLUMN DVNC XI (DISPOSABLE) ×2 IMPLANT
DRAPE DA VINCI XI ARM (DISPOSABLE) ×16
DRAPE DA VINCI XI COLUMN (DISPOSABLE) ×4
DURAPREP 26ML APPLICATOR (WOUND CARE) ×4 IMPLANT
ELECT REM PT RETURN 9FT ADLT (ELECTROSURGICAL) ×4
ELECTRODE REM PT RTRN 9FT ADLT (ELECTROSURGICAL) ×2 IMPLANT
GAUZE 4X4 16PLY RFD (DISPOSABLE) ×4 IMPLANT
GLOVE BIO SURGEON STRL SZ7 (GLOVE) ×4 IMPLANT
GLOVE BIO SURGEON STRL SZ7.5 (GLOVE) ×12 IMPLANT
GLOVE BIOGEL PI IND STRL 7.0 (GLOVE) ×2 IMPLANT
GLOVE BIOGEL PI IND STRL 7.5 (GLOVE) IMPLANT
GLOVE BIOGEL PI INDICATOR 7.0 (GLOVE) ×8
GLOVE BIOGEL PI INDICATOR 7.5 (GLOVE) ×4
GOWN STRL REUS W/ TWL LRG LVL3 (GOWN DISPOSABLE) ×4 IMPLANT
GOWN STRL REUS W/TWL LRG LVL3 (GOWN DISPOSABLE) ×8
HIBICLENS CHG 4% 4OZ (MISCELLANEOUS) ×4 IMPLANT
IRRIG SUCT STRYKERFLOW 2 WTIP (MISCELLANEOUS) ×4
IRRIGATION SUCT STRKRFLW 2 WTP (MISCELLANEOUS) ×2 IMPLANT
KIT PROCEDURE FLUENT (KITS) ×4 IMPLANT
NEEDLE INSUFFLATION 150MM (ENDOMECHANICALS) ×4 IMPLANT
OBTURATOR OPTICAL STANDARD 8MM (TROCAR) ×4
OBTURATOR OPTICAL STND 8 DVNC (TROCAR) ×2
OBTURATOR OPTICALSTD 8 DVNC (TROCAR) ×2 IMPLANT
OCCLUDER COLPOPNEUMO (BALLOONS) ×4 IMPLANT
PACK ROBOT WH (CUSTOM PROCEDURE TRAY) ×4 IMPLANT
PACK ROBOTIC GOWN (GOWN DISPOSABLE) ×4 IMPLANT
PACK TRENDGUARD 450 HYBRID PRO (MISCELLANEOUS) IMPLANT
PACK VAGINAL MINOR WOMEN LF (CUSTOM PROCEDURE TRAY) ×4 IMPLANT
PAD OB MATERNITY 4.3X12.25 (PERSONAL CARE ITEMS) ×4 IMPLANT
PAD PREP 24X48 CUFFED NSTRL (MISCELLANEOUS) ×4 IMPLANT
PROTECTOR NERVE ULNAR (MISCELLANEOUS) ×8 IMPLANT
SEAL CANN UNIV 5-8 DVNC XI (MISCELLANEOUS) ×8 IMPLANT
SEAL XI 5MM-8MM UNIVERSAL (MISCELLANEOUS) ×16
SET IRRIG Y TYPE TUR BLADDER L (SET/KITS/TRAYS/PACK) IMPLANT
SET TRI-LUMEN FLTR TB AIRSEAL (TUBING) ×4 IMPLANT
SPONGE LAP 4X18 RFD (DISPOSABLE) IMPLANT
SUT VIC AB 0 CT1 27 (SUTURE) ×4
SUT VIC AB 0 CT1 27XBRD ANBCTR (SUTURE) ×2 IMPLANT
SUT VICRYL 0 UR6 27IN ABS (SUTURE) ×4 IMPLANT
SUT VICRYL RAPIDE 4/0 PS 2 (SUTURE) ×8 IMPLANT
SUT VLOC 180 0 9IN  GS21 (SUTURE) ×4
SUT VLOC 180 0 9IN GS21 (SUTURE) ×2 IMPLANT
TIP RUMI ORANGE 6.7MMX12CM (TIP) IMPLANT
TIP UTERINE 5.1X6CM LAV DISP (MISCELLANEOUS) IMPLANT
TIP UTERINE 6.7X10CM GRN DISP (MISCELLANEOUS) IMPLANT
TIP UTERINE 6.7X6CM WHT DISP (MISCELLANEOUS) IMPLANT
TIP UTERINE 6.7X8CM BLUE DISP (MISCELLANEOUS) IMPLANT
TOWEL OR 17X26 10 PK STRL BLUE (TOWEL DISPOSABLE) ×8 IMPLANT
TRENDGUARD 450 HYBRID PRO PACK (MISCELLANEOUS)
TROCAR BLADELESS OPT 5 100 (ENDOMECHANICALS) ×2 IMPLANT
TROCAR PORT AIRSEAL 8X120 (TROCAR) ×4 IMPLANT
WATER STERILE IRR 1000ML POUR (IV SOLUTION) ×4 IMPLANT

## 2019-09-19 NOTE — Anesthesia Procedure Notes (Signed)
Procedure Name: Intubation Date/Time: 09/19/2019 10:21 AM Performed by: Suan Halter, CRNA Pre-anesthesia Checklist: Patient identified, Emergency Drugs available, Suction available and Patient being monitored Patient Re-evaluated:Patient Re-evaluated prior to induction Oxygen Delivery Method: Circle system utilized Preoxygenation: Pre-oxygenation with 100% oxygen Induction Type: IV induction Ventilation: Mask ventilation without difficulty Laryngoscope Size: Mac and 3 Grade View: Grade I Tube type: Oral Tube size: 7.0 mm Number of attempts: 1 Airway Equipment and Method: Stylet and Oral airway Placement Confirmation: ETT inserted through vocal cords under direct vision,  positive ETCO2 and breath sounds checked- equal and bilateral Secured at: 22 cm Tube secured with: Tape Dental Injury: Teeth and Oropharynx as per pre-operative assessment

## 2019-09-19 NOTE — Progress Notes (Signed)
Patient seen and examined. Consent witnessed and signed. No changes noted. Update completed.  Discussed EAB and future pregnancy. Not recommended s/p EAB.  BP (!) 141/88   Pulse 74   Temp 97.6 F (36.4 C) (Oral)   Resp 18   Ht 5\' 6"  (1.676 m)   Wt 99.7 kg   LMP 08/08/2019 (Exact Date)   SpO2 100%   BMI 35.49 kg/m   CBC    Component Value Date/Time   WBC 5.7 09/19/2019 0910   RBC 4.09 09/19/2019 0910   HGB 12.9 09/19/2019 0928   HCT 38.0 09/19/2019 0928   PLT 261 09/19/2019 0910   MCV 92.2 09/19/2019 0910   MCH 30.8 09/19/2019 0910   MCHC 33.4 09/19/2019 0910   RDW 11.9 09/19/2019 0910   LYMPHSABS 3.5 09/04/2019 1549   MONOABS 0.5 09/04/2019 1549   EOSABS 0.1 09/04/2019 1549   BASOSABS 0.1 09/04/2019 1549

## 2019-09-19 NOTE — H&P (Signed)
NAME: Kaitlin [name], [name] MEDICAL RECORD [id number] ACCOUNT [id number] DATE OF BIRTH:[birth year] FACILITY: [facility] LOCATION: [facility] PHYSICIAN:Tilden Broz J. Billy Coast, MD  HISTORY AND PHYSICAL  DATE OF ADMISSION:  09/19/2019  CHIEF COMPLAINT:  Dysmenorrhea, menorrhagia, and pelvic pain.  HISTORY OF PRESENT ILLNESS:  A 49 year old white female G1, P0, with a history of laparoscopy for endometriosis 2002, now with worsening increased pelvic pain, dysmenorrhea, menorrhagia for surgical evaluation.  ALLERGIES:  SHE HAS ALLERGIES TO ERYTHROMYCIN AND CODEINE.  SOCIAL HISTORY:  She is a nonsmoker, nondrinker.  Denies domestic physical violence.  FAMILY HISTORY:  Heart disease, breast and colon cancer, heart disease.  SOCIAL HISTORY:  Noncontributory.  Nonsmoker, nondrinker.  Denies domestic physical violence.  PREGNANCY HISTORY:  One uncomplicated 1st trimester termination.  PAST SURGICAL HISTORY:  C5-C6 fusion in 2014, cystectomy of right breast 2014, appendectomy 2012, bunionectomy on the right 2005, eye surgery 2003, and laparoscopy for endometriosis in 2002.  MEDICATIONS:  Hydrochlorothiazide, Prilosec, Amitiza, gabapentin, and supplements.  PHYSICAL EXAMINATION: GENERAL:  Well-developed, well-nourished white female in no acute distress. HEENT:  Normal. NECK:  Supple, full range of motion. LUNGS:  Clear. HEART:  Regular rhythm. ABDOMEN:  Soft, nontender. PELVIC:  Reveals generalized right and left lower quadrant tenderness.  No masses appreciated. EXTREMITIES:  No cords. NEUROLOGIC:  Nonfocal. SKIN:  Intact.  IMPRESSION:  Secondary dysmenorrhea, menorrhagia.  PLAN:  For a Engineer, building services assisted laparoscopic ablation and excision of endometriosis, NovaSure endometrial ablation with diagnostic hysteroscopy.  Risks of anesthesia, infection, bleeding, injury to surrounding organs, possible need for repair discussed.   Delayed versus immediate complications including bowel and bladder  injury noted.  Inability to cure pain discussed.  The patient declines definitive surgery.  CN/NUANCE  D:09/19/2019 T:09/19/2019 JOB:[id number]

## 2019-09-19 NOTE — Discharge Instructions (Signed)
°  Post Anesthesia Home Care Instructions  Activity: Get plenty of rest for the remainder of the day. A responsible individual must stay with you for 24 hours following the procedure.  For the next 24 hours, DO NOT: -Drive a car -Advertising copywriter -Drink alcoholic beverages -Take any medication unless instructed by your physician -Make any legal decisions or sign important papers.  Meals: Start with liquid foods such as gelatin or soup. Progress to regular foods as tolerated. Avoid greasy, spicy, heavy foods. If nausea and/or vomiting occur, drink only clear liquids until the nausea and/or vomiting subsides. Call your physician if vomiting continues.  Special Instructions/Symptoms: Your throat may feel dry or sore from the anesthesia or the breathing tube placed in your throat during surgery. If this causes discomfort, gargle with warm salt water. The discomfort should disappear within 24 hours.  If you had a scopolamine patch placed behind your ear for the management of post- operative nausea and/or vomiting:  1. The medication in the patch is effective for 72 hours, after which it should be removed.  Wrap patch in a tissue and discard in the trash. Wash hands thoroughly with soap and water. 2. You may remove the patch earlier than 72 hours if you experience unpleasant side effects which may include dry mouth, dizziness or visual disturbances. 3. Avoid touching the patch. Wash your hands with soap and water after contact with the patch.     Do not take any Tylenol until after 3:15 pm today. Do not take any nonsteroidal anti inflammatories until after 6:oo pm today.

## 2019-09-19 NOTE — Transfer of Care (Signed)
Immediate Anesthesia Transfer of Care Note  Patient: Kaitlin [name]  Procedure(s) Performed: Procedure(s) (LRB): XI ROBOTIC ASSISTED LAPAROSCOPIC LYSIS OF ADHESION/Ablation of Endometriosis (N/A) DILATATION & CURETTAGE/HYSTEROSCOPY (N/A)  Patient Location: PACU  Anesthesia Type: General  Level of Consciousness: awake, oriented, sedated and patient cooperative  Airway & Oxygen Therapy: Patient Spontanous Breathing and Patient connected to face mask oxygen  Post-op Assessment: Report given to PACU RN and Post -op Vital signs reviewed and stable  Post vital signs: Reviewed and stable  Complications: No apparent anesthesia complications Last Vitals:  Vitals Value Taken Time  BP 133/80 09/19/19 1200  Temp 36.3 C 09/19/19 1158  Pulse 86 09/19/19 1203  Resp 22 09/19/19 1203  SpO2 96 % 09/19/19 1203  Vitals shown include unvalidated device data.  Last Pain:  Vitals:   09/19/19 0918  TempSrc: Oral  PainSc: 0-No pain      Patients Stated Pain Goal: 5 (09/19/19 0321)

## 2019-09-19 NOTE — Anesthesia Postprocedure Evaluation (Signed)
Anesthesia Post Note  Patient: Felipa Emory  Procedure(s) Performed: XI ROBOTIC ASSISTED LAPAROSCOPIC LYSIS OF ADHESION/Ablation of Endometriosis (N/A Abdomen) DILATATION & CURETTAGE/HYSTEROSCOPY (N/A Vagina )     Patient location during evaluation: PACU Anesthesia Type: General Level of consciousness: awake and alert Pain management: pain level controlled Vital Signs Assessment: post-procedure vital signs reviewed and stable Respiratory status: spontaneous breathing, nonlabored ventilation, respiratory function stable and patient connected to nasal cannula oxygen Cardiovascular status: blood pressure returned to baseline and stable Postop Assessment: no apparent nausea or vomiting Anesthetic complications: no    Last Vitals:  Vitals:   09/19/19 1315 09/19/19 1330  BP: 123/85 123/89  Pulse: (!) 55 72  Resp: 11 16  Temp:  36.8 C  SpO2: 98% 96%    Last Pain:  Vitals:   09/19/19 1315  TempSrc:   PainSc: 4                  Trevor Iha

## 2019-09-19 NOTE — Op Note (Signed)
NAME: Kaitlin [name], DROGE MEDICAL RECORD NO:[id number] ACCOUNT [id number] DATE OF BIRTH:11-05-1970 FACILITY: [facility] LOCATION: [facility] PHYSICIAN:Merrianne Mccumbers J. Onyx Edgley, MD  OPERATIVE REPORT  DATE OF PROCEDURE:  09/19/2019  PREOPERATIVE DIAGNOSES:  Pelvic pain, dysmenorrhea, menorrhagia.  POSTOPERATIVE DIAGNOSES:  Pelvic pain, dysmenorrhea, menorrhagia, pelvic endometriosis, pelvic adhesions, and endometrial polyp.  PROCEDURE:  Da Vinci-assisted excision and ablation of pelvic endometriosis with lysis of right and left periovarian adhesions, excision of right cul-de-sac endometriosis, diagnostic hysteroscopy, dilatation and curettage, endometrial polypectomy.  SURGEON:  Brien Few, MD  ASSISTANT:  Sullivan Lone, RNFA  ESTIMATED BLOOD LOSS:  20 mL.  DRAINS:  None.  FLUID DEFICIT:  115 mL.  DISPOSITION:  Patient was taken to recovery in good condition.  SPECIMENS:  Endometrial curettings and polyp to pathology.  BRIEF OPERATIVE NOTE:  After being apprised of the risks of anesthesia, infection, bleeding, injury to surrounding organs, possible need for repair, delayed versus immediate complications to include bowel and bladder, internal vessel injury, possible  need for repair the patient was brought to the operating room where she was administered general anesthetic without complications.  Prepped and draped in usual sterile fashion.  Foley catheter placed.  Exam under anesthesia revealed a sharply retroflexed  uterus.  Questionable evidence of nodularity in the right cul-de-sac.  No adnexal masses are appreciated.  At this time, a Hulka tenaculum was placed without difficulty with minimal ability to manipulate the uterus and attention was turned to the  abdominal portion of the procedure, whereby an infraumbilical incision made with a scalpel.  Veress needle placed, opening pressure of 4.  Three liters of CO2 insufflated without difficulty.  Trocar placed atraumatically.   Visualization reveals a sharply  retroflexed uterus and difficulty visualizing the cul-de-sac.  No ostensible endometriosis  at first visualization.  A 5 mm trocar site was then placed in the left lower quadrant and elevation of the uterus reveals multiple implants of endometriosis  along the posterior wall of the uterus, left ovary and the right ovary was encapsulated endometriosis and stuck to the right ovarian fossa just cephalad to where the ureter is peristalsing.  At this time, 2 robotic ports were then made and the robot was  docked in the standard fashion.  A 5 mm port was switched out for an AirSeal port.  AirSeal was initiated.  At this time, EndoShears and bipolar cautery were used to elevate the uterus and ablate significant posterior surface endometriosis as well as  excision of implant on the left ovary.  The right ovary being adhesed into the cul-de-sac was freed using sharp and blunt dissection.  Ureters were seen peristalsing normally.  Subsequent to that this nodule in the right peritoneal cul-de-sac is excised.   Good hemostasis was noted.  Irrigation accomplished.  At this time, all endometriosis on surface endometriosis of both ovaries is also ablated as well.  Good hemostasis was apparent.  No evidence of an endometrioma.  At this time, Interceed was placed  lying over the posterior surface of the uterus and both ovaries and CO2 was released, oxygen port docks were removed under direct visualization after the robot was undocked.  Incisions were closed using 4-0 Vicryl, 0 Vicryl, Marcaine placed.  Attention  was turned to the vaginal portion of the procedure, whereby cervix was easily dilated up to a 21 Pratt dilator after placement of a dilute Pitressin solution at 3 and 9 o'clock and a dilute Marcaine solution as standard paracervical block.  At this time,  visualization reveals a posterior  wall endometrial polyp, which was removed using sharp curettage and endometrial polyp forceps  without difficulty.  Otherwise, normal endometrial cavity was appreciated.  No evidence of perforation.  Due to the  discussion prior to procedure no ablation was performed.  At this time, instruments were removed.  Fluid deficit is noted.  Minimal bleeding noted.  The patient tolerated the procedure well, was awakened and transferred to recovery in good condition.  CN/NUANCE  D:09/19/2019 T:09/19/2019 JOB:[id number]

## 2019-09-19 NOTE — Op Note (Signed)
09/19/2019  11:45 AM  PATIENT:  Kaitlin [name]  49 y.o. female  PRE-OPERATIVE DIAGNOSIS:  Pelvic Pain, Menorrhagia  POST-OPERATIVE DIAGNOSIS:  Pelvic Pain, Menorrhagia  PROCEDURE:  Procedure(s): XI ROBOTIC ASSISTED LAPAROSCOPIC LYSIS OF LEFT AND RIGHT PERIOVARIAN ADHESIONS Ablation  AND EXICISION OF  Endometriosis DILATATION & CURETTAGE/HYSTEROSCOPY ENDOMETRIAL POLYPECTOMY  SURGEON:  Surgeon(s): Olivia Mackie, MD  ASSISTANTS: Georges Lynch, RNFA   ANESTHESIA:   local and general  ESTIMATED BLOOD LOSS: 20 mL   DRAINS: Urinary Catheter (Foley)   LOCAL MEDICATIONS USED:  MARCAINE    and Amount: 20 ml  SPECIMEN:  Source of Specimen:  EMC AND POLYP  DISPOSITION OF SPECIMEN:  PATHOLOGY  COUNTS:  YES  DICTATION #: [id number]  PLAN OF CARE: DC HOME  PATIENT DISPOSITION:  PACU - hemodynamically stable.

## 2019-09-20 LAB — SURGICAL PATHOLOGY

## 2019-11-08 ENCOUNTER — Other Ambulatory Visit (INDEPENDENT_AMBULATORY_CARE_PROVIDER_SITE_OTHER): Payer: Self-pay | Admitting: Nurse Practitioner

## 2019-11-10 NOTE — Telephone Encounter (Signed)
Hi Tammy, pls have Dr. Rehman review rx request thx 

## 2019-11-15 NOTE — Telephone Encounter (Signed)
This has been addressed by Dr.Rehman.

## 2019-12-05 ENCOUNTER — Inpatient Hospital Stay (HOSPITAL_COMMUNITY): Payer: No Typology Code available for payment source | Attending: Hematology

## 2019-12-05 ENCOUNTER — Other Ambulatory Visit: Payer: Self-pay

## 2019-12-05 ENCOUNTER — Other Ambulatory Visit (HOSPITAL_COMMUNITY)
Admission: RE | Admit: 2019-12-05 | Discharge: 2019-12-05 | Disposition: A | Discharge status: Home / Self Care | Payer: No Typology Code available for payment source | Source: Ambulatory Visit | Attending: Family Medicine | Admitting: Family Medicine

## 2019-12-05 DIAGNOSIS — D509 Iron deficiency anemia, unspecified: Secondary | ICD-10-CM | POA: Insufficient documentation

## 2019-12-05 DIAGNOSIS — E559 Vitamin D deficiency, unspecified: Secondary | ICD-10-CM | POA: Insufficient documentation

## 2019-12-05 DIAGNOSIS — D519 Vitamin B12 deficiency anemia, unspecified: Secondary | ICD-10-CM | POA: Insufficient documentation

## 2019-12-05 DIAGNOSIS — K644 Residual hemorrhoidal skin tags: Secondary | ICD-10-CM | POA: Insufficient documentation

## 2019-12-05 DIAGNOSIS — E876 Hypokalemia: Secondary | ICD-10-CM | POA: Diagnosis present

## 2019-12-05 DIAGNOSIS — I10 Essential (primary) hypertension: Secondary | ICD-10-CM | POA: Diagnosis present

## 2019-12-05 DIAGNOSIS — D529 Folate deficiency anemia, unspecified: Secondary | ICD-10-CM | POA: Insufficient documentation

## 2019-12-05 DIAGNOSIS — K5900 Constipation, unspecified: Secondary | ICD-10-CM | POA: Insufficient documentation

## 2019-12-05 DIAGNOSIS — K909 Intestinal malabsorption, unspecified: Secondary | ICD-10-CM | POA: Insufficient documentation

## 2019-12-05 DIAGNOSIS — K219 Gastro-esophageal reflux disease without esophagitis: Secondary | ICD-10-CM | POA: Insufficient documentation

## 2019-12-05 DIAGNOSIS — Z800 Family history of malignant neoplasm of digestive organs: Secondary | ICD-10-CM | POA: Insufficient documentation

## 2019-12-05 LAB — COMPREHENSIVE METABOLIC PANEL
ALT: 33 U/L (ref 0–44)
AST: 59 U/L — ABNORMAL HIGH (ref 15–41)
Albumin: 4.3 g/dL (ref 3.5–5.0)
Alkaline Phosphatase: 74 U/L (ref 38–126)
Anion gap: 9 (ref 5–15)
BUN: 12 mg/dL (ref 6–20)
CO2: 27 mmol/L (ref 22–32)
Calcium: 9.1 mg/dL (ref 8.9–10.3)
Chloride: 103 mmol/L (ref 98–111)
Creatinine, Ser: 0.8 mg/dL (ref 0.44–1.00)
GFR calc Af Amer: >60 mL/min (ref >60)
GFR calc non Af Amer: >60 mL/min (ref >60)
Glucose, Bld: 111 mg/dL — ABNORMAL HIGH (ref 70–99)
Potassium: 3 mmol/L — ABNORMAL LOW (ref 3.5–5.1)
Sodium: 139 mmol/L (ref 135–145)
Total Bilirubin: 0.8 mg/dL (ref 0.3–1.2)
Total Protein: 7.7 g/dL (ref 6.5–8.1)

## 2019-12-05 LAB — IRON AND TIBC
Iron: 83 ug/dL (ref 28–170)
Saturation Ratios: 19 % (ref 10.4–31.8)
TIBC: 442 ug/dL (ref 250–450)
UIBC: 359 ug/dL

## 2019-12-05 LAB — CBC WITH DIFFERENTIAL/PLATELET
Abs Immature Granulocytes: 0.01 10*3/uL (ref 0.00–0.07)
Basophils Absolute: 0.1 10*3/uL (ref 0.0–0.1)
Basophils Relative: 1 %
Eosinophils Absolute: 0.1 10*3/uL (ref 0.0–0.5)
Eosinophils Relative: 1 %
HCT: 37.7 % (ref 36.0–46.0)
Hemoglobin: 12.4 g/dL (ref 12.0–15.0)
Immature Granulocytes: 0 %
Lymphocytes Relative: 43 %
Lymphs Abs: 2.6 10*3/uL (ref 0.7–4.0)
MCH: 29.7 pg (ref 26.0–34.0)
MCHC: 32.9 g/dL (ref 30.0–36.0)
MCV: 90.2 fL (ref 80.0–100.0)
Monocytes Absolute: 0.3 10*3/uL (ref 0.1–1.0)
Monocytes Relative: 5 %
Neutro Abs: 3 10*3/uL (ref 1.7–7.7)
Neutrophils Relative %: 50 %
Platelets: 328 10*3/uL (ref 150–400)
RBC: 4.18 MIL/uL (ref 3.87–5.11)
RDW: 12.9 % (ref 11.5–15.5)
WBC: 6 10*3/uL (ref 4.0–10.5)
nRBC: 0 % (ref 0.0–0.2)

## 2019-12-05 LAB — VITAMIN B12: Vitamin B-12: 500 pg/mL (ref 180–914)

## 2019-12-05 LAB — LIPID PANEL
Cholesterol: 227 mg/dL — ABNORMAL HIGH (ref 0–200)
HDL: 58 mg/dL (ref >40)
LDL Cholesterol: 152 mg/dL — ABNORMAL HIGH (ref 0–99)
Total CHOL/HDL Ratio: 3.9 RATIO
Triglycerides: 84 mg/dL (ref <150)
VLDL: 17 mg/dL (ref 0–40)

## 2019-12-05 LAB — RETICULOCYTES
Immature Retic Fract: 12.2 % (ref 2.3–15.9)
RBC.: 4.25 MIL/uL (ref 3.87–5.11)
Retic Count, Absolute: 89.7 10*3/uL (ref 19.0–186.0)
Retic Ct Pct: 2.1 % (ref 0.4–3.1)

## 2019-12-05 LAB — VITAMIN D 25 HYDROXY (VIT D DEFICIENCY, FRACTURES): Vit D, 25-Hydroxy: 42.52 ng/mL (ref 30–100)

## 2019-12-05 LAB — TSH: TSH: 1.615 u[IU]/mL (ref 0.350–4.500)

## 2019-12-05 LAB — FOLATE: Folate: 30.9 ng/mL (ref >5.9)

## 2019-12-05 LAB — FERRITIN: Ferritin: 13 ng/mL (ref 11–307)

## 2019-12-12 ENCOUNTER — Ambulatory Visit (HOSPITAL_COMMUNITY): Payer: No Typology Code available for payment source | Admitting: Hematology

## 2019-12-13 ENCOUNTER — Other Ambulatory Visit: Payer: Self-pay

## 2019-12-13 ENCOUNTER — Inpatient Hospital Stay (HOSPITAL_BASED_OUTPATIENT_CLINIC_OR_DEPARTMENT_OTHER): Payer: No Typology Code available for payment source | Admitting: Nurse Practitioner

## 2019-12-13 DIAGNOSIS — K5900 Constipation, unspecified: Secondary | ICD-10-CM | POA: Diagnosis not present

## 2019-12-13 DIAGNOSIS — K909 Intestinal malabsorption, unspecified: Secondary | ICD-10-CM | POA: Diagnosis not present

## 2019-12-13 DIAGNOSIS — D509 Iron deficiency anemia, unspecified: Secondary | ICD-10-CM

## 2019-12-13 DIAGNOSIS — K644 Residual hemorrhoidal skin tags: Secondary | ICD-10-CM | POA: Diagnosis not present

## 2019-12-13 DIAGNOSIS — Z800 Family history of malignant neoplasm of digestive organs: Secondary | ICD-10-CM | POA: Diagnosis not present

## 2019-12-13 NOTE — Progress Notes (Signed)
Kaitlin [name], [place] [address]   CLINIC:  Medical Oncology/Hematology  PCP:  Rory Percy, MD [place] [address] [phone]   REASON FOR VISIT: Follow-up for for iron deficiency anemia  CURRENT THERAPY: Intermittent iron infusions   INTERVAL HISTORY:  Kaitlin [name] 49 y.o. female returns for routine follow-up for iron deficiency anemia.  Patient reports her energy levels are good at this time.  She denies any bright red bleeding per rectum or melena.  She denies any easy bruising or bleeding. Denies any nausea, vomiting, or diarrhea. Denies any new pains. Had not noticed any recent bleeding such as epistaxis, hematuria or hematochezia. Denies recent chest pain on exertion, shortness of breath on minimal exertion, pre-syncopal episodes, or palpitations. Denies any numbness or tingling in hands or feet. Denies any recent fevers, infections, or recent hospitalizations. Patient reports appetite at 100% and energy level at 25%.  She is eating well maintain her weight at this time.  She reports she has been exercising a lot more lately which gives her energy.     REVIEW OF SYSTEMS:  Review of Systems  Cardiovascular: Positive for leg swelling.  Gastrointestinal: Positive for constipation.  Neurological: Positive for numbness.  All other systems reviewed and are negative.    PAST MEDICAL/SURGICAL HISTORY:  Past Medical History:  Diagnosis Date  . Family history of adverse reaction to anesthesia    father and brother--- ponv  . GERD (gastroesophageal reflux disease)   . Hemorrhoids   . History of low potassium    per pt 11/ 2014 ED visit @[facility]  with right arm pain, ekg normal , but had to have IV potassium, no issues since  . Hypertension    followed by pcp   (09-13-2019  per pt never had stress test)  . IBS (irritable bowel syndrome)    s/ chronic constipation  . Iron deficiency anemia secondary to blood loss (chronic)  followed by Dr Delton Coombes   d/t menstrual blood loss;  treated with intermittant iron infusions  . Menorrhagia   . Microcytic anemia   . Pelvic pain   . PONV (postoperative nausea and vomiting)   . Rosacea   . Sciatica, right side    Past Surgical History:  Procedure Laterality Date  . ANTERIOR CERVICAL DECOMP/DISCECTOMY FUSION N/A 06/12/2013   Procedure: ANTERIOR CERVICAL DECOMPRESSION/DISCECTOMY FUSION 1 LEVEL;  Surgeon: Kristeen Miss, MD;  Location: [facility];  Service: Neurosurgery;  Laterality: N/A;  C5-6 Anterior cervical decompression/diskectomy/fusion  . APPENDECTOMY  06/23/2011  . BREAST BIOPSY Right 2014   per pt benign  . COLONOSCOPY W/ POLYPECTOMY  2012  . DIAGNOSTIC LAPAROSCOPY  2002  . DILITATION & CURRETTAGE/HYSTROSCOPY WITH NOVASURE ABLATION N/A 09/19/2019   Procedure: DILATATION & CURETTAGE/HYSTEROSCOPY;  Surgeon: Brien Few, MD;  Location: [facility];  Service: Gynecology;  Laterality: N/A;  . EYE SURGERY  2003   lasik  . FLEXIBLE SIGMOIDOSCOPY N/A 04/27/2018   Procedure: FLEXIBLE SIGMOIDOSCOPY;  Surgeon: Rogene Houston, MD;  [facility];  Service: Endoscopy;  Laterality: N/A;  8:30  . FOOT SURGERY Right 2005   Bunionectomy  . ROBOTIC ASSISTED LAPAROSCOPIC LYSIS OF ADHESION N/A 09/19/2019   Procedure: XI ROBOTIC ASSISTED LAPAROSCOPIC LYSIS OF ADHESION/Ablation of Endometriosis;  Surgeon: Brien Few, MD;  Location: [facility];  Service: Gynecology;  Laterality: N/A;     SOCIAL HISTORY:  Social History   Socioeconomic History  . Marital status: Married  Spouse name: Not on file  . Number of children: Not on file  . Years of education: Not on file  . Highest education level: Not on file  Occupational History  . Not on file  Tobacco Use  . Smoking status: Never Smoker  . Smokeless tobacco: Never Used  Vaping Use  . Vaping Use: Never used  Substance and Sexual Activity  . Alcohol use: No  . Drug  use: Never  . Sexual activity: Not on file  Other Topics Concern  . Not on file  Social History Narrative  . Not on file   Social Determinants of Health   Financial Resource Strain:   . Difficulty of Paying Living Expenses:   Food Insecurity:   . Worried About Programme researcher, broadcasting/film/video in the Last Year:   . Barista in the Last Year:   Transportation Needs:   . Freight forwarder (Medical):   Marland Kitchen Lack of Transportation (Non-Medical):   Physical Activity:   . Days of Exercise per Week:   . Minutes of Exercise per Session:   Stress:   . Feeling of Stress :   Social Connections:   . Frequency of Communication with Friends and Family:   . Frequency of Social Gatherings with Friends and Family:   . Attends Religious Services:   . Active Member of Clubs or Organizations:   . Attends Banker Meetings:   Marland Kitchen Marital Status:   Intimate Partner Violence:   . Fear of Current or Ex-Partner:   . Emotionally Abused:   Marland Kitchen Physically Abused:   . Sexually Abused:     FAMILY HISTORY:  Family History  Problem Relation Age of Onset  . Prostate cancer Father   . Hypertension Brother     CURRENT MEDICATIONS:  Outpatient Encounter Medications as of 12/13/2019  Medication Sig Note  . AMITIZA 24 MCG capsule TAKE 1 CAPSULE BY MOUTH TWICE DAILY WITH A MEAL   . B Complex-C (B-COMPLEX WITH VITAMIN C) tablet Take 1 tablet by mouth daily.   Marland Kitchen BLACK COHOSH HOT FLASH RELIEF 40 MG CAPS Take 40 mg by mouth daily.   . cetirizine (ZYRTEC) 10 MG tablet Take 10 mg by mouth every evening.    . CHOLECALCIFEROL PO Take 5,000 Int'l Units by mouth daily.   . Estradiol-Progesterone (BIJUVA) 1-100 MG CAPS Take by mouth at bedtime.   . famotidine (PEPCID) 20 MG tablet Take 1 tablet (20 mg total) by mouth at bedtime as needed for heartburn or indigestion.   . fluticasone (FLONASE) 50 MCG/ACT nasal spray Place 2 sprays into both nostrils daily.    . furosemide (LASIX) 40 MG tablet Take 40 mg by  mouth daily.    Marland Kitchen gabapentin (NEURONTIN) 300 MG capsule Take 300 mg by mouth at bedtime. Per pt prescribed is written one cap tid prn but she takes only one cap bedtime 09/13/2019: .  Marland Kitchen Omega-3 Fatty Acids (FISH OIL) 1000 MG CAPS Take 1,000 mg by mouth at bedtime.   Marland Kitchen omeprazole (PRILOSEC) 20 MG capsule Take 20 mg by mouth daily.    . potassium chloride SA (KLOR-CON) 20 MEQ tablet Take 2 tablets every 2 hours x 3 doses (6pm, 8pm, 10pm). Total of before bed. Then take 2 tablets at 8am on 09/05/19. (Patient taking differently: Take 20 mEq by mouth every evening. Take 2 tablets every 2 hours x 3 doses (6pm, 8pm, 10pm). Total of before bed. Then take 2 tablets at 8am  on 09/05/19.)   . cyclobenzaprine (FLEXERIL) 10 MG tablet Take 10 mg by mouth 3 (three) times daily as needed for muscle spasms.  (Patient not taking: Reported on 12/13/2019)   . oxyCODONE-acetaminophen (PERCOCET/ROXICET) 5-325 MG tablet Take 1-2 tablets by mouth every 4 (four) hours as needed for severe pain. (Patient not taking: Reported on 12/13/2019)    No facility-administered encounter medications on file as of 12/13/2019.    ALLERGIES:  Allergies  Allergen Reactions  . Erythromycin Itching  . Hydrocodone Itching  . Tape Dermatitis, Hives, Itching, Rash and Swelling     PHYSICAL EXAM:  ECOG Performance status: 1  Vitals:   12/13/19 1505  BP: 124/73  Pulse: 80  Resp: 18  Temp: 98.5 F (36.9 C)  SpO2: 100%   Filed Weights   12/13/19 1505  Weight: 219 lb 6.4 oz (99.5 kg)   Physical Exam Constitutional:      Appearance: Normal appearance. She is normal weight.  Cardiovascular:     Rate and Rhythm: Normal rate and regular rhythm.     Heart sounds: Normal heart sounds.  Pulmonary:     Effort: Pulmonary effort is normal.     Breath sounds: Normal breath sounds.  Abdominal:     General: Bowel sounds are normal.     Palpations: Abdomen is soft.  Musculoskeletal:        General: Normal range of motion.    Skin:    General: Skin is warm.  Neurological:     Mental Status: She is alert and oriented to person, place, and time. Mental status is at baseline.  Psychiatric:        Mood and Affect: Mood normal.        Behavior: Behavior normal.        Thought Content: Thought content normal.        Judgment: Judgment normal.      LABORATORY DATA:  I have reviewed the labs as listed.  CBC    Component Value Date/Time   WBC 6.0 12/05/2019 1351   RBC 4.18 12/05/2019 1351   RBC 4.25 12/05/2019 1351   HGB 12.4 12/05/2019 1351   HCT 37.7 12/05/2019 1351   PLT 328 12/05/2019 1351   MCV 90.2 12/05/2019 1351   MCH 29.7 12/05/2019 1351   MCHC 32.9 12/05/2019 1351   RDW 12.9 12/05/2019 1351   LYMPHSABS 2.6 12/05/2019 1351   MONOABS 0.3 12/05/2019 1351   EOSABS 0.1 12/05/2019 1351   BASOSABS 0.1 12/05/2019 1351   CMP Latest Ref Rng & Units 12/05/2019 09/19/2019 09/05/2019  Glucose 70 - 99 mg/dL 295(J) 86 -  BUN 6 - 20 mg/dL 12 20 -  Creatinine 8.84 - 1.00 mg/dL 1.66 0.63 -  Sodium 016 - 145 mmol/L 139 145 -  Potassium 3.5 - 5.1 mmol/L 3.0(L) 3.6 3.8  Chloride 98 - 111 mmol/L 103 105 -  CO2 22 - 32 mmol/L 27 - -  Calcium 8.9 - 10.3 mg/dL 9.1 - -  Total Protein 6.5 - 8.1 g/dL 7.7 - -  Total Bilirubin 0.3 - 1.2 mg/dL 0.8 - -  Alkaline Phos 38 - 126 U/L 74 - -  AST 15 - 41 U/L 59(H) - -  ALT 0 - 44 U/L 33 - -   All questions were answered to patient's stated satisfaction. Encouraged patient to call with any new concerns or questions before his next visit to the cancer center and we can certain see him sooner, if needed.  ASSESSMENT & PLAN:  Microcytic anemia 1. Microcytic anemia: -She has a history of iron deficiency anemia for several years now.  She stopped menstruating in April 2020, and had minor bleeding in 01/29/2019. 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 -She has taken iron pills in the past which has resulted in severe constipation. -Colonoscopy on 04/27/2018  showed normal colon, external hemorrhoids. -She has ice pica when her iron is low. -She had a work-up which involved folic acid and B12 were normal.  SPEP was negative.  Stool for occult blood x3 was negative. -She has received benefit in the past and has already seen improvement on her ice pica. -She last received 5 infusions of Venofer from 02/20/2019-03/20/2019. -She reports since the iron infusion she has been having periods every 2 weeks with bleeding 3 to 4 days and sometimes just spotting. -Patient was recently seen by GI and tested for celiac's disease which was negative. -Labs done on 12/05/2019 showed hemoglobin 12.4, ferritin 13, percent saturation 19 -Patient has plenty of energy at this time and wants to hold off on iron infusion. -We will recheck labs in 3 months with labs  2.  Constipation: -She takes Benefiber at nighttime. -She also takes Amitiza 24 mcg twice daily.      Orders placed this encounter:  Orders Placed This Encounter  Procedures  . Lactate dehydrogenase  . CBC with Differential/Platelet  . Comprehensive metabolic panel  . Ferritin  . Iron and TIBC  . Vitamin B12  . VITAMIN D 25 Hydroxy (Vit-D Deficiency, Fractures)  . Folate     Mathis Bud, FNP-C [facility] [phone]

## 2019-12-13 NOTE — Assessment & Plan Note (Signed)
1. Microcytic anemia: -She has a history of iron deficiency anemia for several years now.  She stopped menstruating in April 2020, and had minor bleeding in 01/29/2019. 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 -She has taken iron pills in the past which has resulted in severe constipation. -Colonoscopy on 04/27/2018 showed normal colon, external hemorrhoids. -She has ice pica when her iron is low. -She had a work-up which involved folic acid and B12 were normal.  SPEP was negative.  Stool for occult blood x3 was negative. -She has received benefit in the past and has already seen improvement on her ice pica. -She last received 5 infusions of Venofer from 02/20/2019-03/20/2019. -She reports since the iron infusion she has been having periods every 2 weeks with bleeding 3 to 4 days and sometimes just spotting. -Patient was recently seen by GI and tested for celiac's disease which was negative. -Labs done on 12/05/2019 showed hemoglobin 12.4, ferritin 13, percent saturation 19 -Patient has plenty of energy at this time and wants to hold off on iron infusion. -We will recheck labs in 3 months with labs  2.  Constipation: -She takes Benefiber at nighttime. -She also takes Amitiza 24 mcg twice daily.

## 2019-12-13 NOTE — Patient Instructions (Signed)
[facility] at [facility] Discharge Instructions  Follow up in 3 months with lab s   Thank you for choosing [facility] at [facility] to provide your oncology and hematology care.  To afford each patient quality time with our provider, please arrive at least 15 minutes before your scheduled appointment time.   If you have a lab appointment with the Cancer Center please come in thru the Main Entrance and check in at the main information desk.  You need to re-schedule your appointment should you arrive 10 or more minutes late.  We strive to give you quality time with our providers, and arriving late affects you and other patients whose appointments are after yours.  Also, if you no show three or more times for appointments you may be dismissed from the clinic at the providers discretion.     Again, thank you for choosing [facility].  Our hope is that these requests will decrease the amount of time that you wait before being seen by our physicians.       _____________________________________________________________  Should you have questions after your visit to [facility], please contact our office at [phone] between the hours of 8:00 a.m. and 4:30 p.m.  Voicemails left after 4:00 p.m. will not be returned until the following business day.  For prescription refill requests, have your pharmacy contact our office and allow 72 hours.    Due to Covid, you will need to wear a mask upon entering the hospital. If you do not have a mask, a mask will be given to you at the Main Entrance upon arrival. For doctor visits, patients may have 1 support person with them. For treatment visits, patients can not have anyone with them due to social distancing guidelines and our immunocompromised population.      

## 2020-03-12 ENCOUNTER — Other Ambulatory Visit: Payer: Self-pay

## 2020-03-12 ENCOUNTER — Inpatient Hospital Stay (HOSPITAL_COMMUNITY): Payer: No Typology Code available for payment source | Attending: Hematology

## 2020-03-12 DIAGNOSIS — D509 Iron deficiency anemia, unspecified: Secondary | ICD-10-CM | POA: Insufficient documentation

## 2020-03-12 DIAGNOSIS — K644 Residual hemorrhoidal skin tags: Secondary | ICD-10-CM | POA: Diagnosis not present

## 2020-03-12 DIAGNOSIS — K909 Intestinal malabsorption, unspecified: Secondary | ICD-10-CM | POA: Insufficient documentation

## 2020-03-12 DIAGNOSIS — K5900 Constipation, unspecified: Secondary | ICD-10-CM | POA: Insufficient documentation

## 2020-03-12 DIAGNOSIS — Z800 Family history of malignant neoplasm of digestive organs: Secondary | ICD-10-CM | POA: Insufficient documentation

## 2020-03-12 LAB — CBC WITH DIFFERENTIAL/PLATELET
Abs Immature Granulocytes: 0.01 10*3/uL (ref 0.00–0.07)
Basophils Absolute: 0 10*3/uL (ref 0.0–0.1)
Basophils Relative: 1 %
Eosinophils Absolute: 0.1 10*3/uL (ref 0.0–0.5)
Eosinophils Relative: 1 %
HCT: 38.8 % (ref 36.0–46.0)
Hemoglobin: 12.6 g/dL (ref 12.0–15.0)
Immature Granulocytes: 0 %
Lymphocytes Relative: 39 %
Lymphs Abs: 2.7 10*3/uL (ref 0.7–4.0)
MCH: 29.7 pg (ref 26.0–34.0)
MCHC: 32.5 g/dL (ref 30.0–36.0)
MCV: 91.5 fL (ref 80.0–100.0)
Monocytes Absolute: 0.4 10*3/uL (ref 0.1–1.0)
Monocytes Relative: 5 %
Neutro Abs: 3.8 10*3/uL (ref 1.7–7.7)
Neutrophils Relative %: 54 %
Platelets: 282 10*3/uL (ref 150–400)
RBC: 4.24 MIL/uL (ref 3.87–5.11)
RDW: 12.8 % (ref 11.5–15.5)
WBC: 7.1 10*3/uL (ref 4.0–10.5)
nRBC: 0 % (ref 0.0–0.2)

## 2020-03-12 LAB — COMPREHENSIVE METABOLIC PANEL
ALT: 17 U/L (ref 0–44)
AST: 18 U/L (ref 15–41)
Albumin: 4.1 g/dL (ref 3.5–5.0)
Alkaline Phosphatase: 70 U/L (ref 38–126)
Anion gap: 9 (ref 5–15)
BUN: 16 mg/dL (ref 6–20)
CO2: 27 mmol/L (ref 22–32)
Calcium: 8.8 mg/dL — ABNORMAL LOW (ref 8.9–10.3)
Chloride: 103 mmol/L (ref 98–111)
Creatinine, Ser: 0.82 mg/dL (ref 0.44–1.00)
GFR calc Af Amer: >60 mL/min (ref >60)
GFR calc non Af Amer: >60 mL/min (ref >60)
Glucose, Bld: 94 mg/dL (ref 70–99)
Potassium: 3.8 mmol/L (ref 3.5–5.1)
Sodium: 139 mmol/L (ref 135–145)
Total Bilirubin: 0.6 mg/dL (ref 0.3–1.2)
Total Protein: 7.2 g/dL (ref 6.5–8.1)

## 2020-03-12 LAB — LACTATE DEHYDROGENASE: LDH: 142 U/L (ref 98–192)

## 2020-03-12 LAB — FOLATE: Folate: 32.3 ng/mL

## 2020-03-12 LAB — VITAMIN B12: Vitamin B-12: 388 pg/mL (ref 180–914)

## 2020-03-12 LAB — VITAMIN D 25 HYDROXY (VIT D DEFICIENCY, FRACTURES): Vit D, 25-Hydroxy: 39.19 ng/mL (ref 30–100)

## 2020-03-12 LAB — IRON AND TIBC
Iron: 46 ug/dL (ref 28–170)
Saturation Ratios: 11 % (ref 10.4–31.8)
TIBC: 421 ug/dL (ref 250–450)
UIBC: 375 ug/dL

## 2020-03-12 LAB — FERRITIN: Ferritin: 10 ng/mL — ABNORMAL LOW (ref 11–307)

## 2020-03-19 ENCOUNTER — Other Ambulatory Visit: Payer: Self-pay

## 2020-03-19 ENCOUNTER — Inpatient Hospital Stay (HOSPITAL_BASED_OUTPATIENT_CLINIC_OR_DEPARTMENT_OTHER): Payer: No Typology Code available for payment source | Admitting: Nurse Practitioner

## 2020-03-19 DIAGNOSIS — D509 Iron deficiency anemia, unspecified: Secondary | ICD-10-CM | POA: Diagnosis not present

## 2020-03-19 NOTE — Progress Notes (Signed)
[facility] [address] [place], [place] [address]   CLINIC:  Medical Oncology/Hematology  PCP:  Selinda FlavinHoward, Kevin, MD [address] [place] [id number] [phone]   REASON FOR VISIT: Follow-up for iron deficiency anemia   CURRENT THERAPY: Intermittent iron infusions   INTERVAL HISTORY:  Kaitlin [name] 49 y.o. female returns for routine follow-up for iron deficiency anemia.  Patient reports she is doing well since her last visit.  She denies any bright red blood per rectum or melena.  She denies any easy bruising or bleeding. Denies any nausea, vomiting, or diarrhea. Denies any new pains. Had not noticed any recent bleeding such as epistaxis, hematuria or hematochezia. Denies recent chest pain on exertion, shortness of breath on minimal exertion, pre-syncopal episodes, or palpitations. Denies any numbness or tingling in hands or feet. Denies any recent fevers, infections, or recent hospitalizations. Patient reports appetite at 100% and energy level at 75%.     REVIEW OF SYSTEMS:  Review of Systems  Constitutional: Positive for fatigue.  Neurological: Positive for numbness.  All other systems reviewed and are negative.    PAST MEDICAL/SURGICAL HISTORY:  Past Medical History:  Diagnosis Date  . Family history of adverse reaction to anesthesia    father and brother--- ponv  . GERD (gastroesophageal reflux disease)   . Hemorrhoids   . History of low potassium    per pt 11/ 2014 ED visit @[facility]  with right arm pain, ekg normal , but had to have IV potassium, no issues since  . Hypertension    followed by pcp   (09-13-2019  per pt never had stress test)  . IBS (irritable bowel syndrome)    s/ chronic constipation  . Iron deficiency anemia secondary to blood loss (chronic) followed by Dr Ellin SabaKatragadda   d/t menstrual blood loss;  treated with intermittant iron infusions  . Menorrhagia   . Microcytic anemia   . Pelvic pain   . PONV (postoperative nausea and  vomiting)   . Rosacea   . Sciatica, right side    Past Surgical History:  Procedure Laterality Date  . ANTERIOR CERVICAL DECOMP/DISCECTOMY FUSION N/A 06/12/2013   Procedure: ANTERIOR CERVICAL DECOMPRESSION/DISCECTOMY FUSION 1 LEVEL;  Surgeon: Barnett AbuHenry Elsner, MD;  Location: [facility];  Service: Neurosurgery;  Laterality: N/A;  C5-6 Anterior cervical decompression/diskectomy/fusion  . APPENDECTOMY  06/23/2011  . BREAST BIOPSY Right 2014   per pt benign  . COLONOSCOPY W/ POLYPECTOMY  2012  . DIAGNOSTIC LAPAROSCOPY  2002  . DILITATION & CURRETTAGE/HYSTROSCOPY WITH NOVASURE ABLATION N/A 09/19/2019   Procedure: DILATATION & CURETTAGE/HYSTEROSCOPY;  Surgeon: Olivia Mackieaavon, Richard, MD;  [facility];  Service: Gynecology;  Laterality: N/A;  . EYE SURGERY  2003   lasik  . FLEXIBLE SIGMOIDOSCOPY N/A 04/27/2018   Procedure: FLEXIBLE SIGMOIDOSCOPY;  Surgeon: Malissa Hippoehman, Najeeb U, MD;  Location: [facility];  Service: Endoscopy;  Laterality: N/A;  8:30  . FOOT SURGERY Right 2005   Bunionectomy  . ROBOTIC ASSISTED LAPAROSCOPIC LYSIS OF ADHESION N/A 09/19/2019   Procedure: XI ROBOTIC ASSISTED LAPAROSCOPIC LYSIS OF ADHESION/Ablation of Endometriosis;  Surgeon: Olivia Mackieaavon, Richard, MD;  [facility];  Service: Gynecology;  Laterality: N/A;     SOCIAL HISTORY:  Social History   Socioeconomic History  . Marital status: Married    Spouse name: Not on file  . Number of children: Not on file  . Years of education: Not on file  . Highest education level: Not on file  Occupational History  . Not on file  Tobacco Use  . Smoking status: Never Smoker  . Smokeless tobacco: Never Used  Vaping Use  . Vaping Use: Never used  Substance and Sexual Activity  . Alcohol use: No  . Drug use: Never  . Sexual activity: Not on file  Other Topics Concern  . Not on file  Social History Narrative  . Not on file   Social Determinants of Health   Financial Resource Strain:    . Difficulty of Paying Living Expenses: Not on file  Food Insecurity:   . Worried About Programme researcher, broadcasting/film/video in the Last Year: Not on file  . Ran Out of Food in the Last Year: Not on file  Transportation Needs:   . Lack of Transportation (Medical): Not on file  . Lack of Transportation (Non-Medical): Not on file  Physical Activity:   . Days of Exercise per Week: Not on file  . Minutes of Exercise per Session: Not on file  Stress:   . Feeling of Stress : Not on file  Social Connections:   . Frequency of Communication with Friends and Family: Not on file  . Frequency of Social Gatherings with Friends and Family: Not on file  . Attends Religious Services: Not on file  . Active Member of Clubs or Organizations: Not on file  . Attends Banker Meetings: Not on file  . Marital Status: Not on file  Intimate Partner Violence:   . Fear of Current or Ex-Partner: Not on file  . Emotionally Abused: Not on file  . Physically Abused: Not on file  . Sexually Abused: Not on file    FAMILY HISTORY:  Family History  Problem Relation Age of Onset  . Prostate cancer Father   . Hypertension Brother     CURRENT MEDICATIONS:  Outpatient Encounter Medications as of 03/19/2020  Medication Sig Note  . AMITIZA 24 MCG capsule TAKE 1 CAPSULE BY MOUTH TWICE DAILY WITH A MEAL   . B Complex-C (B-COMPLEX WITH VITAMIN C) tablet Take 1 tablet by mouth daily.   . cetirizine (ZYRTEC) 10 MG tablet Take 10 mg by mouth every evening.    . CHOLECALCIFEROL PO Take 5,000 Int'l Units by mouth daily.   . cyclobenzaprine (FLEXERIL) 10 MG tablet Take 10 mg by mouth 3 (three) times daily as needed for muscle spasms.    . Estradiol-Progesterone (BIJUVA) 1-100 MG CAPS Take by mouth at bedtime.   . famotidine (PEPCID) 20 MG tablet Take 1 tablet (20 mg total) by mouth at bedtime as needed for heartburn or indigestion.   . fluticasone (FLONASE) 50 MCG/ACT nasal spray Place 2 sprays into both nostrils daily.    .  furosemide (LASIX) 40 MG tablet Take 40 mg by mouth daily.    Marland Kitchen gabapentin (NEURONTIN) 300 MG capsule Take 300 mg by mouth at bedtime. Per pt prescribed is written one cap tid prn but she takes only one cap bedtime 09/13/2019: .  Marland Kitchen Omega-3 Fatty Acids (FISH OIL) 1000 MG CAPS Take 1,000 mg by mouth at bedtime.   Marland Kitchen omeprazole (PRILOSEC) 20 MG capsule Take 20 mg by mouth daily.    . potassium chloride SA (KLOR-CON) 20 MEQ tablet Take 2 tablets every 2 hours x 3 doses (6pm, 8pm, 10pm). Total of before bed. Then take 2 tablets at 8am on 09/05/19. (Patient taking differently: Take 20 mEq by mouth every evening. Take 20 meq in the morning and 10 meq at night.  Total of before bed.)   . [DISCONTINUED] BLACK COHOSH HOT FLASH RELIEF 40 MG CAPS Take 40 mg by mouth daily.   . [DISCONTINUED] oxyCODONE-acetaminophen (PERCOCET/ROXICET) 5-325 MG tablet Take 1-2 tablets by mouth every 4 (four) hours as needed for severe pain. (Patient not taking: Reported on 12/13/2019)    No facility-administered encounter medications on file as of 03/19/2020.    ALLERGIES:  Allergies  Allergen Reactions  . Erythromycin Itching  . Hydrocodone Itching  . Tape Hives, Itching, Swelling, Dermatitis and Rash    dermabond     PHYSICAL EXAM:  ECOG Performance status: 1  Vitals:   03/19/20 1423  BP: 128/76  Pulse: 73  Resp: 18  Temp: (!) 96.8 F (36 C)  SpO2: 100%   Filed Weights   03/19/20 1423  Weight: 215 lb 4.8 oz (97.7 kg)   Physical Exam Constitutional:      Appearance: Normal appearance. She is normal weight.  Cardiovascular:     Rate and Rhythm: Normal rate and regular rhythm.     Heart sounds: Normal heart sounds.  Pulmonary:     Effort: Pulmonary effort is normal.     Breath sounds: Normal breath sounds.  Abdominal:     General: Bowel sounds are normal.     Palpations: Abdomen is soft.  Musculoskeletal:        General: Normal range of motion.  Skin:    General: Skin is warm.   Neurological:     Mental Status: She is alert and oriented to person, place, and time. Mental status is at baseline.  Psychiatric:        Mood and Affect: Mood normal.        Behavior: Behavior normal.        Thought Content: Thought content normal.        Judgment: Judgment normal.      LABORATORY DATA:  I have reviewed the labs as listed.  CBC    Component Value Date/Time   WBC 7.1 03/12/2020 1528   RBC 4.24 03/12/2020 1528   HGB 12.6 03/12/2020 1528   HCT 38.8 03/12/2020 1528   PLT 282 03/12/2020 1528   MCV 91.5 03/12/2020 1528   MCH 29.7 03/12/2020 1528   MCHC 32.5 03/12/2020 1528   RDW 12.8 03/12/2020 1528   LYMPHSABS 2.7 03/12/2020 1528   MONOABS 0.4 03/12/2020 1528   EOSABS 0.1 03/12/2020 1528   BASOSABS 0.0 03/12/2020 1528   CMP Latest Ref Rng & Units 03/12/2020 12/05/2019 09/19/2019  Glucose 70 - 99 mg/dL 94 960(A) 86  BUN 6 - 20 mg/dL 16 12 20   Creatinine 0.44 - 1.00 mg/dL 5.40 9.81  Sodium 135 - 145 mmol/L 139 139 145  Potassium 3.5 - 5.1 mmol/L 3.8 3.0(L) 3.6  Chloride 98 - 111 mmol/L 103 103 105  CO2 22 - 32 mmol/L 27 27 -  Calcium 8.9 - 10.3 mg/dL 1.91) 9.1 -  Total Protein 6.5 - 8.1 g/dL 7.2 7.7 -  Total Bilirubin 0.3 - 1.2 mg/dL 0.6 0.8 -  Alkaline Phos 38 - 126 U/L 70 74 -  AST 15 - 41 U/L 18 59(H) -  ALT 0 - 44 U/L 17 33 -    All questions were answered to patient's stated satisfaction. Encouraged patient to call with any new concerns or questions before his next visit to the cancer center and we can certain see him sooner, if needed.     ASSESSMENT & PLAN:  Microcytic anemia 1. Microcytic  anemia: -She has a history of iron deficiency anemia for several years now.  She stopped menstruating in April 2020, and had minor bleeding in 01/29/2019. 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 -She has taken iron pills in the past which has resulted in severe constipation. -Colonoscopy on 04/27/2018 showed normal colon, external  hemorrhoids. -She has ice pica when her iron is low. -She had a work-up which involved folic acid and B12 were normal.  SPEP was negative.  Stool for occult blood x3 was negative. -She has received benefit in the past and has already seen improvement on her ice pica. -She last received 5 infusions of Venofer from 02/20/2019-03/20/2019. -She reports since the iron infusion she has been having periods every 2 weeks with bleeding 3 to 4 days and sometimes just spotting. -Patient was recently seen by GI and tested for celiac's disease which was negative. -Labs done on 03/12/2020 showed hemoglobin 12.6, ferritin 10, percent saturation 11 -We will give her 2 infusions of IV iron -We will recheck labs in 6 months with labs  2.  Constipation: -She takes Benefiber at nighttime. -She also takes Amitiza 24 mcg twice daily.      Orders placed this encounter:  Orders Placed This Encounter  Procedures  . CBC with Differential/Platelet  . Comprehensive metabolic panel  . Ferritin  . Iron and TIBC  . Lactate dehydrogenase  . CBC with Differential/Platelet  . Comprehensive metabolic panel  . Vitamin B12  . VITAMIN D 25 Hydroxy (Vit-D Deficiency, Fractures)      Mathis Bud, FNP-C [facility] [phone]

## 2020-03-19 NOTE — Assessment & Plan Note (Signed)
1. Microcytic anemia: -She has a history of iron deficiency anemia for several years now.  She stopped menstruating in April 2020, and had minor bleeding in 01/29/2019. 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 -She has taken iron pills in the past which has resulted in severe constipation. -Colonoscopy on 04/27/2018 showed normal colon, external hemorrhoids. -She has ice pica when her iron is low. -She had a work-up which involved folic acid and B12 were normal.  SPEP was negative.  Stool for occult blood x3 was negative. -She has received benefit in the past and has already seen improvement on her ice pica. -She last received 5 infusions of Venofer from 02/20/2019-03/20/2019. -She reports since the iron infusion she has been having periods every 2 weeks with bleeding 3 to 4 days and sometimes just spotting. -Patient was recently seen by GI and tested for celiac's disease which was negative. -Labs done on 03/12/2020 showed hemoglobin 12.6, ferritin 10, percent saturation 11 -We will give her 2 infusions of IV iron -We will recheck labs in 6 months with labs  2.  Constipation: -She takes Benefiber at nighttime. -She also takes Amitiza 24 mcg twice daily.

## 2020-04-02 ENCOUNTER — Encounter (HOSPITAL_COMMUNITY): Payer: Self-pay

## 2020-04-02 ENCOUNTER — Other Ambulatory Visit: Payer: Self-pay

## 2020-04-02 ENCOUNTER — Inpatient Hospital Stay (HOSPITAL_COMMUNITY): Payer: PRIVATE HEALTH INSURANCE | Attending: Hematology

## 2020-04-02 VITALS — BP 136/77 | HR 71 | Temp 97.0°F | Resp 18

## 2020-04-02 DIAGNOSIS — D509 Iron deficiency anemia, unspecified: Secondary | ICD-10-CM | POA: Diagnosis present

## 2020-04-02 DIAGNOSIS — K644 Residual hemorrhoidal skin tags: Secondary | ICD-10-CM | POA: Insufficient documentation

## 2020-04-02 DIAGNOSIS — Z800 Family history of malignant neoplasm of digestive organs: Secondary | ICD-10-CM | POA: Diagnosis not present

## 2020-04-02 DIAGNOSIS — K5900 Constipation, unspecified: Secondary | ICD-10-CM | POA: Diagnosis not present

## 2020-04-02 DIAGNOSIS — K909 Intestinal malabsorption, unspecified: Secondary | ICD-10-CM | POA: Diagnosis not present

## 2020-04-02 MED ORDER — SODIUM CHLORIDE 0.9 % IV SOLN: Freq: Once | INTRAVENOUS | Status: AC

## 2020-04-02 MED ORDER — SODIUM CHLORIDE 0.9 % IV SOLN
510.0000 mg | Freq: Once | INTRAVENOUS | Status: AC
  Administered 2020-04-02: 510 mg via INTRAVENOUS
  Filled 2020-04-02: qty 510

## 2020-04-02 NOTE — Progress Notes (Signed)
Tolerated infusion w/o adverse reaction.  Alert, in no distress.  VSS.  Discharged ambulatory in stable condition.  

## 2020-04-02 NOTE — Patient Instructions (Signed)
[facility] at [facility] Discharge Instructions   Today you received Feraheme (iron) intravenously. Return as scheduled for second infusion. Return as scheduled for lab work and office visit.    Thank you for choosing [facility] at [facility] to provide your oncology and hematology care.  To afford each patient quality time with our provider, please arrive at least 15 minutes before your scheduled appointment time.   If you have a lab appointment with the Cancer Center please come in thru the Main Entrance and check in at the main information desk.  You need to re-schedule your appointment should you arrive 10 or more minutes late.  We strive to give you quality time with our providers, and arriving late affects you and other patients whose appointments are after yours.  Also, if you no show three or more times for appointments you may be dismissed from the clinic at the providers discretion.     Again, thank you for choosing [facility].  Our hope is that these requests will decrease the amount of time that you wait before being seen by our physicians.       _____________________________________________________________  Should you have questions after your visit to [facility], please contact our office at [phone] and follow the prompts.  Our office hours are 8:00 a.m. and 4:30 p.m. Monday - Friday.  Please note that voicemails left after 4:00 p.m. may not be returned until the following business day.  We are closed weekends and major holidays.  You do have access to a nurse 24-7, just call the main number to the clinic [phone] and do not press any options, hold on the line and a nurse will answer the phone.    For prescription refill requests, have your pharmacy contact our office and allow 72 hours.    Due to Covid, you will need to wear a mask upon entering the hospital. If you do not have a mask, a mask  will be given to you at the Main Entrance upon arrival. For doctor visits, patients may have 1 support person age 45 or older with them. For treatment visits, patients can not have anyone with them due to social distancing guidelines and our immunocompromised population.

## 2020-04-09 ENCOUNTER — Inpatient Hospital Stay (HOSPITAL_COMMUNITY): Payer: PRIVATE HEALTH INSURANCE

## 2020-04-09 ENCOUNTER — Other Ambulatory Visit: Payer: Self-pay

## 2020-04-09 VITALS — BP 139/83 | HR 79 | Temp 97.1°F | Resp 18

## 2020-04-09 DIAGNOSIS — D509 Iron deficiency anemia, unspecified: Secondary | ICD-10-CM

## 2020-04-09 MED ORDER — SODIUM CHLORIDE 0.9 % IV SOLN
510.0000 mg | Freq: Once | INTRAVENOUS | Status: AC
  Administered 2020-04-09: 510 mg via INTRAVENOUS
  Filled 2020-04-09: qty 510

## 2020-04-09 MED ORDER — SODIUM CHLORIDE 0.9 % IV SOLN: Freq: Once | INTRAVENOUS | Status: AC

## 2020-04-09 NOTE — Patient Instructions (Signed)
[facility] at [facility]  Discharge Instructions:   _______________________________________________________________  Thank you for choosing [facility] at [facility] to provide your oncology and hematology care.  To afford each patient quality time with our providers, please arrive at least 15 minutes before your scheduled appointment.  You need to re-schedule your appointment if you arrive 10 or more minutes late.  We strive to give you quality time with our providers, and arriving late affects you and other patients whose appointments are after yours.  Also, if you no show three or more times for appointments you may be dismissed from the clinic.  Again, thank you for choosing  [facility] at [facility]. Our hope is that these requests will allow you access to exceptional care and in a timely manner. _______________________________________________________________  If you have questions after your visit, please contact our office at [phone] between the hours of 8:30 a.m. and 5:00 p.m. Voicemails left after 4:30 p.m. will not be returned until the following business day. _______________________________________________________________  For prescription refill requests, have your pharmacy contact our office. _______________________________________________________________  Recommendations made by the consultant and any test results will be sent to your referring physician. _______________________________________________________________ 

## 2020-04-09 NOTE — Progress Notes (Signed)
Iron infusion given per orders. Patient tolerated it well without problems. Vitals stable and discharged home from clinic ambulatory in stable condition. Follow up as scheduled.  

## 2020-05-16 ENCOUNTER — Ambulatory Visit (INDEPENDENT_AMBULATORY_CARE_PROVIDER_SITE_OTHER): Payer: No Typology Code available for payment source | Admitting: Gastroenterology

## 2020-05-20 ENCOUNTER — Ambulatory Visit (INDEPENDENT_AMBULATORY_CARE_PROVIDER_SITE_OTHER): Payer: No Typology Code available for payment source | Admitting: Gastroenterology

## 2020-05-21 ENCOUNTER — Encounter (INDEPENDENT_AMBULATORY_CARE_PROVIDER_SITE_OTHER): Payer: Self-pay | Admitting: Gastroenterology

## 2020-05-21 ENCOUNTER — Other Ambulatory Visit: Payer: Self-pay

## 2020-05-21 ENCOUNTER — Ambulatory Visit (INDEPENDENT_AMBULATORY_CARE_PROVIDER_SITE_OTHER): Payer: 59 | Admitting: Internal Medicine

## 2020-05-21 ENCOUNTER — Ambulatory Visit (INDEPENDENT_AMBULATORY_CARE_PROVIDER_SITE_OTHER): Payer: No Typology Code available for payment source | Admitting: Gastroenterology

## 2020-05-21 VITALS — BP 121/82 | HR 80 | Temp 99.0°F | Ht 66.0 in | Wt 221.0 lb

## 2020-05-21 DIAGNOSIS — D509 Iron deficiency anemia, unspecified: Secondary | ICD-10-CM | POA: Diagnosis not present

## 2020-05-21 DIAGNOSIS — K5909 Other constipation: Secondary | ICD-10-CM

## 2020-05-21 DIAGNOSIS — K219 Gastro-esophageal reflux disease without esophagitis: Secondary | ICD-10-CM | POA: Diagnosis not present

## 2020-05-21 MED ORDER — OMEPRAZOLE 20 MG PO CPDR
20.0000 mg | DELAYED_RELEASE_CAPSULE | Freq: Every day | ORAL | 3 refills | Status: DC
Start: 2020-05-21 — End: 2022-09-14

## 2020-05-21 MED ORDER — FAMOTIDINE 20 MG PO TABS
20.0000 mg | ORAL_TABLET | Freq: Every evening | ORAL | Status: DC | PRN
Start: 2020-05-21 — End: 2022-10-14

## 2020-05-21 MED ORDER — LUBIPROSTONE 24 MCG PO CAPS: 24.0000 ug | ORAL_CAPSULE | Freq: Two times a day (BID) | ORAL | 11 refills | Status: DC

## 2020-05-21 NOTE — Patient Instructions (Signed)
We are refilling medications at current dose - due for colonoscopy Jan 2022 as discussed

## 2020-05-21 NOTE — Progress Notes (Signed)
Patient profile: Kaitlin [name] is a 49 y.o. female seen for follow up - last seen 04/2019 for IDA and constipation.   History of Present Illness: Kaitlin [name] is seen today for follow up - she reports overall doing well from GI standpoint. Currently taking amitiza once a day most days, usually in AM before breakfast. Typically having a BM most days without blood. She reports one episode of rectal bleeding over the past year, this last occurred in August. She denies abd pain.   She reports doing well on omeprazole 20mg  and Pepcid 20 mg-takes omeprazole in the morning Pepcid in evening.  Very rarely has GERD symptoms.  She denies any nausea vomiting or dysphagia. Appetite good. Hoping to loose weight.   Wt Readings from Last 3 Encounters:  05/21/20 221 lb (100.2 kg)  03/19/20 215 lb 4.8 oz (97.7 kg)  12/13/19 219 lb 6.4 oz (99.5 kg)     Last Colonoscopy: 2019-04/27/2018 which revealed external hemorrhoids otherwise normal examination to the proximal transverse colon.  Preparation was excellent. I felt her iron deficiency anemia may be due to impaired iron absorption.    Last Endoscopy: 2017 without Barrett's esophagus.    Past Medical History:  Past Medical History:  Diagnosis Date  . Family history of adverse reaction to anesthesia    father and brother--- ponv  . GERD (gastroesophageal reflux disease)   . Hemorrhoids   . History of low potassium    per pt 11/ 2014 ED visit @[facility]  with right arm pain, ekg normal , but had to have IV potassium, no issues since  . Hypertension    followed by pcp   (09-13-2019  per pt never had stress test)  . IBS (irritable bowel syndrome)    s/ chronic constipation  . Iron deficiency anemia secondary to blood loss (chronic) followed by Dr   d/t menstrual blood loss;  treated with intermittant iron infusions  . Menorrhagia   . Microcytic anemia   . Pelvic pain   . PONV (postoperative nausea and vomiting)   .  Rosacea   . Sciatica, right side     Problem List: Patient Active Problem List   Diagnosis Date Noted  . Iron deficiency anemia 05/16/2019  . GERD (gastroesophageal reflux disease) 05/16/2019  . Microcytic anemia 02/13/2019  . Rectal bleeding 03/31/2018  . Herniated nucleus pulposus, C5-6 right 06/12/2013    Past Surgical History: Past Surgical History:  Procedure Laterality Date  . ANTERIOR CERVICAL DECOMP/DISCECTOMY FUSION N/A 06/12/2013   Procedure: ANTERIOR CERVICAL DECOMPRESSION/DISCECTOMY FUSION 1 LEVEL;  Surgeon: 06/14/2013, MD;  Location: [facility];  Service: Neurosurgery;  Laterality: N/A;  C5-6 Anterior cervical decompression/diskectomy/fusion  . APPENDECTOMY  06/23/2011  . BREAST BIOPSY Right 2014   per pt benign  . COLONOSCOPY W/ POLYPECTOMY  2012  . DIAGNOSTIC LAPAROSCOPY  2002  . DILITATION & CURRETTAGE/HYSTROSCOPY WITH NOVASURE ABLATION N/A 09/19/2019   Procedure: DILATATION & CURETTAGE/HYSTEROSCOPY;  Surgeon: 2003, MD;  Location: [facility];  Service: Gynecology;  Laterality: N/A;  . EYE SURGERY  2003   lasik  . FLEXIBLE SIGMOIDOSCOPY N/A 04/27/2018   Procedure: FLEXIBLE SIGMOIDOSCOPY;  Surgeon: 2004, MD;  Location: [facility];  Service: Endoscopy;  Laterality: N/A;  8:30  . FOOT SURGERY Right 2005   Bunionectomy  . ROBOTIC ASSISTED LAPAROSCOPIC LYSIS OF ADHESION N/A 09/19/2019   Procedure: XI ROBOTIC ASSISTED LAPAROSCOPIC LYSIS OF ADHESION/Ablation of Endometriosis;  Surgeon: Malissa Hippo, MD;  Location:  [facility];  Service: Gynecology;  Laterality: N/A;    Allergies: Allergies  Allergen Reactions  . Erythromycin Itching  . Hydrocodone Itching  . Tape Hives, Itching, Swelling, Dermatitis and Rash    dermabond      Home Medications:  Current Outpatient Medications:  .  B Complex-C (B-COMPLEX WITH VITAMIN C) tablet, Take 1 tablet by mouth daily., Disp: , Rfl:  .  cetirizine (ZYRTEC) 10 MG  tablet, Take 10 mg by mouth every evening. , Disp: , Rfl:  .  CHOLECALCIFEROL PO, Take 5,000 Int'l Units by mouth daily., Disp: , Rfl:  .  Estradiol-Progesterone (BIJUVA) 1-100 MG CAPS, Take by mouth at bedtime., Disp: , Rfl:  .  famotidine (PEPCID) 20 MG tablet, Take 1 tablet (20 mg total) by mouth at bedtime as needed for heartburn or indigestion., Disp: , Rfl:  .  fluticasone (FLONASE) 50 MCG/ACT nasal spray, Place 2 sprays into both nostrils daily. , Disp: , Rfl:  .  furosemide (LASIX) 40 MG tablet, Take 40 mg by mouth daily. , Disp: , Rfl:  .  gabapentin (NEURONTIN) 300 MG capsule, Take 300 mg by mouth at bedtime. Per pt prescribed is written one cap tid prn but she takes only one cap bedtime, Disp: , Rfl:  .  lubiprostone (AMITIZA) 24 MCG capsule, Take 1 capsule (24 mcg total) by mouth 2 (two) times daily with a meal., Disp: 60 capsule, Rfl: 11 .  Omega-3 Fatty Acids (FISH OIL) 1000 MG CAPS, Take 1,000 mg by mouth at bedtime., Disp: , Rfl:  .  omeprazole (PRILOSEC) 20 MG capsule, Take 1 capsule (20 mg total) by mouth daily., Disp: 90 capsule, Rfl: 3 .  potassium chloride SA (KLOR-CON) 20 MEQ tablet, Take 2 tablets every 2 hours x 3 doses (6pm, 8pm, 10pm). Total of before bed. Then take 2 tablets at 8am on 09/05/19. (Patient taking differently: Take 20 mEq by mouth every evening. Take 20 meq in the morning and 10 meq at night. Total of before bed.), Disp: 20 tablet, Rfl: 0 .  cyclobenzaprine (FLEXERIL) 10 MG tablet, Take 10 mg by mouth 3 (three) times daily as needed for muscle spasms.  (Patient not taking: Reported on 05/21/2020), Disp: , Rfl:    Family History: family history includes Hypertension in her brother; Prostate cancer in her father.    Social History:   reports that she has never smoked. She has never used smokeless tobacco. She reports that she does not drink alcohol and does not use drugs.   Review of Systems: Constitutional: Denies weight loss/weight gain  Eyes:  No changes in vision. ENT: No oral lesions, sore throat.  GI: see HPI.  Heme/Lymph: No easy bruising.  CV: No chest pain.  GU: No hematuria.  Integumentary: No rashes.  Neuro: No headaches.  Psych: No depression/anxiety.  Endocrine: No heat/cold intolerance.  Allergic/Immunologic: No urticaria.  Resp: No cough, SOB.  Musculoskeletal: No joint swelling.    Physical Examination: BP 121/82 (BP Location: Right Arm, Patient Position: Sitting, Cuff Size: Large)   Pulse 80   Temp 99 F (37.2 C) (Oral)   Ht 5\' 6"  (1.676 m)   Wt 221 lb (100.2 kg)   BMI 35.67 kg/m  Gen: NAD, alert and oriented x 4 HEENT: PEERLA, EOMI, Neck: supple, no JVD Chest: CTA bilaterally, no wheezes, crackles, or other adventitious sounds CV: RRR, no m/g/c/r Abd: soft, NT, ND, +BS in all four quadrants; no HSM, guarding, ridigity, or rebound tenderness Ext:  no edema, well perfused with 2+ pulses, Skin: no rash or lesions noted on observed skin Lymph: no noted LAD  Data Reviewed:  Labs reviewed 02/2020 - ferritin 10, Hgb 12.6, MCV 91   Assessment/Plan: Ms. Geier is a 49 y.o. female seen for follow up.   1.Constipation-doing well on Amitiza 24 mcg 1-2 times a day.  She is due for colonoscopy January 2022 and denies any prior issues with sedation.  She does not feel Linzess worked well in the past.  Continue current regimen.   2.  GERD-well-controlled on omeprazole 20 mg in the morning and Pepcid 20 mg in evening.  No Barrett's esophagus on endoscopy 2017 & no upper GI alarm symptoms.  We will hold off on repeat upper endoscopy at this time.  3.  Iron deficiency anemia-ferritin 10, she received IV iron in September 2021, followed by hematology. Vitamin D & vitamin B12 normal.  Negative celiac panel.   Patient denies CP, SOB, and use of blood thinners. I discussed the risks and benefits of procedure including bleeding, perforation, infection, missed lesions, medication reactions and possible hospitalization  or surgery if complications. All questions answered.   Kaitlin [name] was seen today for follow-up.  Diagnoses and all orders for this visit:  Chronic GERD  Iron deficiency anemia, unspecified iron deficiency anemia type  Chronic constipation  Other orders -     omeprazole (PRILOSEC) 20 MG capsule; Take 1 capsule (20 mg total) by mouth daily. -     famotidine (PEPCID) 20 MG tablet; Take 1 tablet (20 mg total) by mouth at bedtime as needed for heartburn or indigestion. -     lubiprostone (AMITIZA) 24 MCG capsule; Take 1 capsule (24 mcg total) by mouth 2 (two) times daily with a meal.    I personally performed the service, non-incident to. (WP)  Tawni Pummel, [facility]

## 2020-06-25 ENCOUNTER — Encounter (INDEPENDENT_AMBULATORY_CARE_PROVIDER_SITE_OTHER): Payer: Self-pay | Admitting: *Deleted

## 2020-09-16 ENCOUNTER — Other Ambulatory Visit (HOSPITAL_COMMUNITY): Payer: Self-pay

## 2020-09-16 DIAGNOSIS — D509 Iron deficiency anemia, unspecified: Secondary | ICD-10-CM

## 2020-09-17 ENCOUNTER — Other Ambulatory Visit: Payer: Self-pay

## 2020-09-17 ENCOUNTER — Inpatient Hospital Stay (HOSPITAL_COMMUNITY): Payer: PRIVATE HEALTH INSURANCE | Attending: Hematology

## 2020-09-17 DIAGNOSIS — R5383 Other fatigue: Secondary | ICD-10-CM | POA: Diagnosis not present

## 2020-09-17 DIAGNOSIS — G8929 Other chronic pain: Secondary | ICD-10-CM | POA: Diagnosis not present

## 2020-09-17 DIAGNOSIS — Z8719 Personal history of other diseases of the digestive system: Secondary | ICD-10-CM | POA: Diagnosis not present

## 2020-09-17 DIAGNOSIS — K589 Irritable bowel syndrome without diarrhea: Secondary | ICD-10-CM | POA: Insufficient documentation

## 2020-09-17 DIAGNOSIS — M545 Low back pain, unspecified: Secondary | ICD-10-CM | POA: Insufficient documentation

## 2020-09-17 DIAGNOSIS — Z888 Allergy status to other drugs, medicaments and biological substances status: Secondary | ICD-10-CM | POA: Diagnosis not present

## 2020-09-17 DIAGNOSIS — D509 Iron deficiency anemia, unspecified: Secondary | ICD-10-CM | POA: Diagnosis present

## 2020-09-17 DIAGNOSIS — K909 Intestinal malabsorption, unspecified: Secondary | ICD-10-CM | POA: Diagnosis not present

## 2020-09-17 DIAGNOSIS — Z8249 Family history of ischemic heart disease and other diseases of the circulatory system: Secondary | ICD-10-CM | POA: Insufficient documentation

## 2020-09-17 DIAGNOSIS — Z8042 Family history of malignant neoplasm of prostate: Secondary | ICD-10-CM | POA: Insufficient documentation

## 2020-09-17 DIAGNOSIS — Z885 Allergy status to narcotic agent status: Secondary | ICD-10-CM | POA: Insufficient documentation

## 2020-09-17 DIAGNOSIS — Z9049 Acquired absence of other specified parts of digestive tract: Secondary | ICD-10-CM | POA: Diagnosis not present

## 2020-09-17 DIAGNOSIS — Z881 Allergy status to other antibiotic agents status: Secondary | ICD-10-CM | POA: Diagnosis not present

## 2020-09-17 DIAGNOSIS — K219 Gastro-esophageal reflux disease without esophagitis: Secondary | ICD-10-CM | POA: Diagnosis not present

## 2020-09-17 DIAGNOSIS — K644 Residual hemorrhoidal skin tags: Secondary | ICD-10-CM | POA: Insufficient documentation

## 2020-09-17 DIAGNOSIS — Z79899 Other long term (current) drug therapy: Secondary | ICD-10-CM | POA: Diagnosis not present

## 2020-09-17 DIAGNOSIS — R200 Anesthesia of skin: Secondary | ICD-10-CM | POA: Insufficient documentation

## 2020-09-17 LAB — IRON AND TIBC
Iron: 82 ug/dL (ref 28–170)
Saturation Ratios: 26 % (ref 10.4–31.8)
TIBC: 318 ug/dL (ref 250–450)
UIBC: 236 ug/dL

## 2020-09-17 LAB — COMPREHENSIVE METABOLIC PANEL
ALT: 18 U/L (ref 0–44)
AST: 17 U/L (ref 15–41)
Albumin: 4.1 g/dL (ref 3.5–5.0)
Alkaline Phosphatase: 83 U/L (ref 38–126)
Anion gap: 9 (ref 5–15)
BUN: 13 mg/dL (ref 6–20)
CO2: 25 mmol/L (ref 22–32)
Calcium: 8.9 mg/dL (ref 8.9–10.3)
Chloride: 106 mmol/L (ref 98–111)
Creatinine, Ser: 0.69 mg/dL (ref 0.44–1.00)
GFR, Estimated: >60 mL/min (ref >60)
Glucose, Bld: 99 mg/dL (ref 70–99)
Potassium: 4 mmol/L (ref 3.5–5.1)
Sodium: 140 mmol/L (ref 135–145)
Total Bilirubin: 0.4 mg/dL (ref 0.3–1.2)
Total Protein: 7.1 g/dL (ref 6.5–8.1)

## 2020-09-17 LAB — CBC WITH DIFFERENTIAL/PLATELET
Abs Immature Granulocytes: 0.01 10*3/uL (ref 0.00–0.07)
Basophils Absolute: 0.1 10*3/uL (ref 0.0–0.1)
Basophils Relative: 1 %
Eosinophils Absolute: 0.1 10*3/uL (ref 0.0–0.5)
Eosinophils Relative: 1 %
HCT: 40.1 % (ref 36.0–46.0)
Hemoglobin: 13.5 g/dL (ref 12.0–15.0)
Immature Granulocytes: 0 %
Lymphocytes Relative: 36 %
Lymphs Abs: 2.6 10*3/uL (ref 0.7–4.0)
MCH: 32 pg (ref 26.0–34.0)
MCHC: 33.7 g/dL (ref 30.0–36.0)
MCV: 95 fL (ref 80.0–100.0)
Monocytes Absolute: 0.5 10*3/uL (ref 0.1–1.0)
Monocytes Relative: 6 %
Neutro Abs: 4 10*3/uL (ref 1.7–7.7)
Neutrophils Relative %: 56 %
Platelets: 302 10*3/uL (ref 150–400)
RBC: 4.22 MIL/uL (ref 3.87–5.11)
RDW: 11.9 % (ref 11.5–15.5)
WBC: 7.2 10*3/uL (ref 4.0–10.5)
nRBC: 0 % (ref 0.0–0.2)

## 2020-09-17 LAB — VITAMIN D 25 HYDROXY (VIT D DEFICIENCY, FRACTURES): Vit D, 25-Hydroxy: 30.25 ng/mL (ref 30–100)

## 2020-09-17 LAB — FERRITIN: Ferritin: 120 ng/mL (ref 11–307)

## 2020-09-17 LAB — LACTATE DEHYDROGENASE: LDH: 159 U/L (ref 98–192)

## 2020-09-17 LAB — VITAMIN B12: Vitamin B-12: 386 pg/mL (ref 180–914)

## 2020-09-24 ENCOUNTER — Ambulatory Visit (HOSPITAL_COMMUNITY): Payer: No Typology Code available for payment source | Admitting: Hematology

## 2020-09-24 ENCOUNTER — Other Ambulatory Visit: Payer: Self-pay

## 2020-09-24 ENCOUNTER — Inpatient Hospital Stay (HOSPITAL_BASED_OUTPATIENT_CLINIC_OR_DEPARTMENT_OTHER): Payer: PRIVATE HEALTH INSURANCE | Admitting: Oncology

## 2020-09-24 VITALS — BP 157/72 | HR 73 | Temp 96.8°F | Resp 20 | Wt 229.3 lb

## 2020-09-24 DIAGNOSIS — D509 Iron deficiency anemia, unspecified: Secondary | ICD-10-CM

## 2020-09-24 NOTE — Progress Notes (Signed)
[facility] [address], [place] [address]   CLINIC:  Medical Oncology/Hematology  PCP:  Selinda Flavin, MD [address] [place] [address] [phone]   REASON FOR VISIT: Follow-up for iron deficiency anemia   CURRENT THERAPY: Intermittent iron infusions   INTERVAL HISTORY:  Kaitlin [name] 50 y.o. female returns for routine follow-up for iron deficiency anemia.  She was last seen in clinic on 03/19/2020.  She last received IV iron on 04/02/2020 and 04/09/2020.  In the interim, she was seen by GI for constipation and IDA.  Appears to be stable on omeprazole and Pepcid for GERD symptoms and Amitiza for constipation.  Was noted to have some external hemorrhoids on colonoscopy which would explain some rectal bleeding.  IDA thought to be secondary to impaired iron absorption.  She is due for a repeat colonoscopy.  Today, she continues to feel well.  She denies any pica symptoms.  She denies any recurrent vaginal bleeding.  She has been compliant with Bijuva which was prescribed by her OB/GYN.  Her appetite has been good and she has gained some weight.  Her energy levels are low but stable.  She has chronic low back pain.   She has had a lot of emotional stress over the past 3 months.  Her family all suffered from Covid at the beginning of January and her mother had a heart attack a few weeks later.  Another family member had a stroke and she recently lost her sister-in-law.  She attributes the stress to her weight gain.   REVIEW OF SYSTEMS:  Review of Systems  Constitutional: Positive for fatigue.  Neurological: Positive for numbness.  All other systems reviewed and are negative.    PAST MEDICAL/SURGICAL HISTORY:  Past Medical History:  Diagnosis Date  . Family history of adverse reaction to anesthesia    father and brother--- ponv  . GERD (gastroesophageal reflux disease)   . Hemorrhoids   . History of low potassium    per pt 11/ 2014 ED visit @[facility]   with right arm pain, ekg normal , but had to have IV potassium, no issues since  . Hypertension    followed by pcp   (09-13-2019  per pt never had stress test)  . IBS (irritable bowel syndrome)    s/ chronic constipation  . Iron deficiency anemia secondary to blood loss (chronic) followed by Dr 09-15-2019   d/t menstrual blood loss;  treated with intermittant iron infusions  . Menorrhagia   . Microcytic anemia   . Pelvic pain   . PONV (postoperative nausea and vomiting)   . Rosacea   . Sciatica, right side    Past Surgical History:  Procedure Laterality Date  . ANTERIOR CERVICAL DECOMP/DISCECTOMY FUSION N/A 06/12/2013   Procedure: ANTERIOR CERVICAL DECOMPRESSION/DISCECTOMY FUSION 1 LEVEL;  Surgeon: 06/14/2013, MD;  Location: [facility];  Service: Neurosurgery;  Laterality: N/A;  C5-6 Anterior cervical decompression/diskectomy/fusion  . APPENDECTOMY  06/23/2011  . BREAST BIOPSY Right 2014   per pt benign  . COLONOSCOPY W/ POLYPECTOMY  2012  . DIAGNOSTIC LAPAROSCOPY  2002  . DILITATION & CURRETTAGE/HYSTROSCOPY WITH NOVASURE ABLATION N/A 09/19/2019   Procedure: DILATATION & CURETTAGE/HYSTEROSCOPY;  Surgeon: 09/21/2019, MD;  Location: [facility];  Service: Gynecology;  Laterality: N/A;  . EYE SURGERY  2003   lasik  . FLEXIBLE SIGMOIDOSCOPY N/A 04/27/2018   Procedure: FLEXIBLE SIGMOIDOSCOPY;  Surgeon: 04/29/2018, MD;  [facility]: [facility];  Service:  Endoscopy;  Laterality: N/A;  8:30  . FOOT SURGERY Right 2005   Bunionectomy  . ROBOTIC ASSISTED LAPAROSCOPIC LYSIS OF ADHESION N/A 09/19/2019   Procedure: XI ROBOTIC ASSISTED LAPAROSCOPIC LYSIS OF ADHESION/Ablation of Endometriosis;  Surgeon: Olivia Mackie, MD;  Location: [facility];  Service: Gynecology;  Laterality: N/A;     SOCIAL HISTORY:  Social History   Socioeconomic History  . Marital status: Married    Spouse name: Not on file  . Number of children: Not on file  . Years of  education: Not on file  . Highest education level: Not on file  Occupational History  . Not on file  Tobacco Use  . Smoking status: Never Smoker  . Smokeless tobacco: Never Used  Vaping Use  . Vaping Use: Never used  Substance and Sexual Activity  . Alcohol use: No  . Drug use: Never  . Sexual activity: Not on file  Other Topics Concern  . Not on file  Social History Narrative  . Not on file   Social Determinants of Health   Financial Resource Strain: Not on file  Food Insecurity: Not on file  Transportation Needs: Not on file  Physical Activity: Not on file  Stress: Not on file  Social Connections: Not on file  Intimate Partner Violence: Not on file    FAMILY HISTORY:  Family History  Problem Relation Age of Onset  . Prostate cancer Father   . Hypertension Brother     CURRENT MEDICATIONS:  Outpatient Encounter Medications as of 09/24/2020  Medication Sig Note  . B Complex-C (B-COMPLEX WITH VITAMIN C) tablet Take 1 tablet by mouth daily.   . cetirizine (ZYRTEC) 10 MG tablet Take 10 mg by mouth every evening.    . CHOLECALCIFEROL PO Take 5,000 Int'l Units by mouth daily.   . cyclobenzaprine (FLEXERIL) 10 MG tablet Take 10 mg by mouth 3 (three) times daily as needed for muscle spasms.  (Patient not taking: Reported on 05/21/2020)   . Estradiol-Progesterone (BIJUVA) 1-100 MG CAPS Take by mouth at bedtime.   . famotidine (PEPCID) 20 MG tablet Take 1 tablet (20 mg total) by mouth at bedtime as needed for heartburn or indigestion.   . fluticasone (FLONASE) 50 MCG/ACT nasal spray Place 2 sprays into both nostrils daily.    . furosemide (LASIX) 40 MG tablet Take 40 mg by mouth daily.    Marland Kitchen gabapentin (NEURONTIN) 300 MG capsule Take 300 mg by mouth at bedtime. Per pt prescribed is written one cap tid prn but she takes only one cap bedtime 09/13/2019: .  . lubiprostone (AMITIZA) 24 MCG capsule Take 1 capsule (24 mcg total) by mouth 2 (two) times daily with a meal.   . Omega-3  Fatty Acids (FISH OIL) 1000 MG CAPS Take 1,000 mg by mouth at bedtime.   Marland Kitchen omeprazole (PRILOSEC) 20 MG capsule Take 1 capsule (20 mg total) by mouth daily.   . potassium chloride SA (KLOR-CON) 20 MEQ tablet Take 2 tablets every 2 hours x 3 doses (6pm, 8pm, 10pm). Total of before bed. Then take 2 tablets at 8am on 09/05/19. (Patient taking differently: Take 20 mEq by mouth every evening. Take 20 meq in the morning and 10 meq at night. Total of before bed.)    No facility-administered encounter medications on file as of 09/24/2020.    ALLERGIES:  Allergies  Allergen Reactions  . Erythromycin Itching  . Hydrocodone Itching  . Tape Hives, Itching, Swelling, Dermatitis and Rash  dermabond     PHYSICAL EXAM:  ECOG Performance status: 1  There were no vitals filed for this visit. There were no vitals filed for this visit. Physical Exam Constitutional:      Appearance: Normal appearance. She is normal weight.  Cardiovascular:     Rate and Rhythm: Normal rate and regular rhythm.     Heart sounds: Normal heart sounds.  Pulmonary:     Effort: Pulmonary effort is normal.     Breath sounds: Normal breath sounds.  Abdominal:     General: Bowel sounds are normal.     Palpations: Abdomen is soft.  Musculoskeletal:        General: Normal range of motion.  Skin:    General: Skin is warm.  Neurological:     Mental Status: She is alert and oriented to person, place, and time. Mental status is at baseline.  Psychiatric:        Mood and Affect: Mood normal.        Behavior: Behavior normal.        Thought Content: Thought content normal.        Judgment: Judgment normal.      LABORATORY DATA:  I have reviewed the labs as listed.  CBC    Component Value Date/Time   WBC 7.2 09/17/2020 1442   RBC 4.22 09/17/2020 1442   HGB 13.5 09/17/2020 1442   HCT 40.1 09/17/2020 1442   PLT 302 09/17/2020 1442   MCV 95.0 09/17/2020 1442   MCH 32.0 09/17/2020 1442   MCHC 33.7  09/17/2020 1442   RDW 11.9 09/17/2020 1442   LYMPHSABS 2.6 09/17/2020 1442   MONOABS 0.5 09/17/2020 1442   EOSABS 0.1 09/17/2020 1442   BASOSABS 0.1 09/17/2020 1442   CMP Latest Ref Rng & Units 09/17/2020 03/12/2020 12/05/2019  Glucose 70 - 99 mg/dL 99 94 497(W)  BUN 6 - 20 mg/dL 13 16 12   Creatinine 0.44 - 1.00 mg/dL 2.63 7.85  Sodium 135 - 145 mmol/L 140 139 139  Potassium 3.5 - 5.1 mmol/L 4.0 3.8 3.0(L)  Chloride 98 - 111 mmol/L 106 103 103  CO2 22 - 32 mmol/L 25 27 27   Calcium 8.9 - 10.3 mg/dL 8.9 8.85) 9.1  Total Protein 6.5 - 8.1 g/dL 7.1 7.2 7.7  Total Bilirubin 0.3 - 1.2 mg/dL 0.4 0.6 0.8  Alkaline Phos 38 - 126 U/L 83 70 74  AST 15 - 41 U/L 17 18 59(H)  ALT 0 - 44 U/L 18 17 33    All questions were answered to patient's stated satisfaction. Encouraged patient to call with any new concerns or questions before his next visit to the cancer center and we can certain see him sooner, if needed.     ASSESSMENT & PLAN:  1. Microcytic anemia: -She has a history of iron deficiency anemia for several years now.  -She is postmenopausal but has had periodic vaginal bleeding.  She is currently on Bijuva with symptom control.  She is followed by her OB/GYN. 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 -She has taken iron pills in the past which has resulted in severe constipation. -Colonoscopy on 04/27/2018 showed normal colon, external hemorrhoids. -She has ice pica when her iron is low. -She had a work-up which involved folic acid and B12 were normal.  SPEP was negative.  Stool for occult blood x3 was negative. -She received 5 infusions of Venofer from 02/20/2019-03/20/2019 and 2 doses of IV Feraheme on 04/02/2020 and 04/09/2020. -Labs  from 09/17/2020 show a ferritin of 120, iron saturation 26%, hemoglobin 13.5 -She does not need any additional iron at this time.  2.  Constipation: -Patient had follow-up with GI recently-stable on Amitiza for constipation and omeprazole  and Pepcid for her GERD symptoms.  3.  Weight gain: -Secondary to several family health problems including the loss of a very close family member. -She is hoping to start walking when the weather warms up.  Disposition:  -No additional IV iron needed at this time. -RTC in 6 months with repeat labs and MD assessment.  No problem-specific Assessment & Plan notes found for this encounter.  Greater than 50% was spent in counseling and coordination of care with this patient including but not limited to discussion of the relevant topics above (See A&P) including, but not limited to diagnosis and management of acute and chronic medical conditions.     Orders placed this encounter:  No orders of the defined types were placed in this encounter.  Kaitlin HurtJennifer Thadd Apuzzo, NP 09/25/2020 1:18 PM  [name] [phone]

## 2020-11-20 ENCOUNTER — Encounter (HOSPITAL_COMMUNITY): Payer: Self-pay | Admitting: Hematology

## 2021-03-21 ENCOUNTER — Emergency Department (HOSPITAL_COMMUNITY)
Admission: EM | Admit: 2021-03-21 | Discharge: 2021-03-21 | Disposition: A | Discharge status: Home / Self Care | Payer: No Typology Code available for payment source | Attending: Emergency Medicine | Admitting: Emergency Medicine

## 2021-03-21 ENCOUNTER — Other Ambulatory Visit: Payer: Self-pay

## 2021-03-21 ENCOUNTER — Encounter (HOSPITAL_COMMUNITY): Payer: Self-pay

## 2021-03-21 ENCOUNTER — Emergency Department (HOSPITAL_COMMUNITY): Payer: No Typology Code available for payment source

## 2021-03-21 ENCOUNTER — Encounter (HOSPITAL_COMMUNITY): Payer: Self-pay | Admitting: Hematology

## 2021-03-21 DIAGNOSIS — R109 Unspecified abdominal pain: Secondary | ICD-10-CM | POA: Diagnosis present

## 2021-03-21 DIAGNOSIS — R39198 Other difficulties with micturition: Secondary | ICD-10-CM | POA: Diagnosis not present

## 2021-03-21 DIAGNOSIS — I10 Essential (primary) hypertension: Secondary | ICD-10-CM | POA: Insufficient documentation

## 2021-03-21 DIAGNOSIS — R110 Nausea: Secondary | ICD-10-CM | POA: Insufficient documentation

## 2021-03-21 DIAGNOSIS — K219 Gastro-esophageal reflux disease without esophagitis: Secondary | ICD-10-CM | POA: Diagnosis not present

## 2021-03-21 LAB — CBC WITH DIFFERENTIAL/PLATELET
Abs Immature Granulocytes: 0.04 10*3/uL (ref 0.00–0.07)
Basophils Absolute: 0 10*3/uL (ref 0.0–0.1)
Basophils Relative: 0 %
Eosinophils Absolute: 0 10*3/uL (ref 0.0–0.5)
Eosinophils Relative: 0 %
HCT: 37.3 % (ref 36.0–46.0)
Hemoglobin: 12.9 g/dL (ref 12.0–15.0)
Immature Granulocytes: 0 %
Lymphocytes Relative: 11 %
Lymphs Abs: 1.2 10*3/uL (ref 0.7–4.0)
MCH: 32.6 pg (ref 26.0–34.0)
MCHC: 34.6 g/dL (ref 30.0–36.0)
MCV: 94.2 fL (ref 80.0–100.0)
Monocytes Absolute: 0.8 10*3/uL (ref 0.1–1.0)
Monocytes Relative: 7 %
Neutro Abs: 9.1 10*3/uL — ABNORMAL HIGH (ref 1.7–7.7)
Neutrophils Relative %: 82 %
Platelets: 273 10*3/uL (ref 150–400)
RBC: 3.96 MIL/uL (ref 3.87–5.11)
RDW: 11.9 % (ref 11.5–15.5)
WBC: 11.2 10*3/uL — ABNORMAL HIGH (ref 4.0–10.5)
nRBC: 0 % (ref 0.0–0.2)

## 2021-03-21 LAB — URINALYSIS, ROUTINE W REFLEX MICROSCOPIC
Bilirubin Urine: NEGATIVE
Glucose, UA: NEGATIVE mg/dL
Hgb urine dipstick: NEGATIVE
Ketones, ur: NEGATIVE mg/dL
Nitrite: NEGATIVE
Protein, ur: NEGATIVE mg/dL
Specific Gravity, Urine: 1.008 (ref 1.005–1.030)
pH: 7 (ref 5.0–8.0)

## 2021-03-21 LAB — BASIC METABOLIC PANEL
Anion gap: 6 (ref 5–15)
BUN: 12 mg/dL (ref 6–20)
CO2: 25 mmol/L (ref 22–32)
Calcium: 8.4 mg/dL — ABNORMAL LOW (ref 8.9–10.3)
Chloride: 103 mmol/L (ref 98–111)
Creatinine, Ser: 0.84 mg/dL (ref 0.44–1.00)
GFR, Estimated: >60 mL/min (ref >60)
Glucose, Bld: 101 mg/dL — ABNORMAL HIGH (ref 70–99)
Potassium: 3.6 mmol/L (ref 3.5–5.1)
Sodium: 134 mmol/L — ABNORMAL LOW (ref 135–145)

## 2021-03-21 LAB — PREGNANCY, URINE: Preg Test, Ur: NEGATIVE

## 2021-03-21 MED ORDER — HYDROMORPHONE HCL 1 MG/ML IJ SOLN
1.0000 mg | Freq: Once | INTRAMUSCULAR | Status: AC
  Administered 2021-03-21: 1 mg via INTRAVENOUS
  Filled 2021-03-21: qty 1

## 2021-03-21 MED ORDER — ONDANSETRON HCL 4 MG PO TABS: 4.0000 mg | ORAL_TABLET | Freq: Four times a day (QID) | ORAL | 0 refills | Status: DC

## 2021-03-21 MED ORDER — OXYCODONE-ACETAMINOPHEN 5-325 MG PO TABS: 1.0000 | ORAL_TABLET | Freq: Four times a day (QID) | ORAL | 0 refills | Status: DC | PRN

## 2021-03-21 MED ORDER — ONDANSETRON HCL 4 MG/2ML IJ SOLN
4.0000 mg | Freq: Once | INTRAMUSCULAR | Status: AC
  Administered 2021-03-21: 4 mg via INTRAVENOUS
  Filled 2021-03-21: qty 2

## 2021-03-21 MED ORDER — KETOROLAC TROMETHAMINE 30 MG/ML IJ SOLN
30.0000 mg | Freq: Once | INTRAMUSCULAR | Status: AC
  Administered 2021-03-21: 30 mg via INTRAVENOUS
  Filled 2021-03-21: qty 1

## 2021-03-21 NOTE — Discharge Instructions (Signed)
Your CT results today show that you may have recently passed a kidney stone or that you have a developing kidney infection.  As discussed, please get your antibiotic filled and start taking the medication this evening.  Take all the antibiotic as directed until finished.  Drink plenty of water.  You have been prescribed pain medication and nausea medicine to take if needed.  You may review your urine culture results in a few days on the MyChart app.  Please follow-up with your primary care provider for recheck.  Return to the emergency department for any new or worsening symptoms.

## 2021-03-21 NOTE — ED Triage Notes (Signed)
Pt presents to ED with complaints of right flank pain radiating to front, started Sunday. Pt seen PCP last night, thinks may be pyelonephritis and was given Rocephin IM. Pt states she has had difficulty urinating and nausea also.

## 2021-03-22 NOTE — ED Provider Notes (Signed)
[facility] EMERGENCY DEPARTMENT Provider Note   CSN: [id number] Arrival date & time: 03/21/21  1209     History Chief Complaint  Patient presents with   Flank Pain    Kaitlin [name] is a 50 y.o. female.   Flank Pain Pertinent negatives include no chest pain, no abdominal pain, no headaches and no shortness of breath.      Kaitlin [name] is a 50 y.o. female who presents to the Emergency Department complaining of right flank pain for nearly one week.  Describes pain as severe at times and radiating to her right abdomen.  She also reports difficulty with urination and nausea.  No fever, chills or vomiting.  She was seen by her PCP on the day prior to arrival and given injection of rocephin without improvement.  Received prescription for unknown antibiotic, but hasn't gotten medication filled yet. She denies vaginal bleeding, hematuria, pelvic pain and abnormal vaginal bleeding.     Past Medical History:  Diagnosis Date   Family history of adverse reaction to anesthesia    father and brother--- ponv   GERD (gastroesophageal reflux disease)    Hemorrhoids    History of low potassium    per pt 11/ 2014 ED visit @[facility]  with right arm pain, ekg normal , but had to have IV potassium, no issues since   Hypertension    followed by pcp   (09-13-2019  per pt never had stress test)   IBS (irritable bowel syndrome)    s/ chronic constipation   Iron deficiency anemia secondary to blood loss (chronic) followed by Dr 09-15-2019   d/t menstrual blood loss;  treated with intermittant iron infusions   Menorrhagia    Microcytic anemia    Pelvic pain    PONV (postoperative nausea and vomiting)    Rosacea    Sciatica, right side     Patient Active Problem List   Diagnosis Date Noted   Iron deficiency anemia 05/16/2019   GERD (gastroesophageal reflux disease) 05/16/2019   Microcytic anemia 02/13/2019   Rectal bleeding 03/31/2018   Herniated nucleus pulposus, C5-6 right  06/12/2013    Past Surgical History:  Procedure Laterality Date   ANTERIOR CERVICAL DECOMP/DISCECTOMY FUSION N/A 06/12/2013   Procedure: ANTERIOR CERVICAL DECOMPRESSION/DISCECTOMY FUSION 1 LEVEL;  Surgeon: 06/14/2013, MD;  Location: [facility];  Service: Neurosurgery;  Laterality: N/A;  C5-6 Anterior cervical decompression/diskectomy/fusion   APPENDECTOMY  06/23/2011   BREAST BIOPSY Right 2014   per pt benign   COLONOSCOPY W/ POLYPECTOMY  2012   DIAGNOSTIC LAPAROSCOPY  2002   DILITATION & CURRETTAGE/HYSTROSCOPY WITH NOVASURE ABLATION N/A 09/19/2019   Procedure: DILATATION & CURETTAGE/HYSTEROSCOPY;  Surgeon: 09/21/2019, MD;  Location: [facility] ;  Service: Gynecology;  Laterality: N/A;   EYE SURGERY  2003   lasik   FLEXIBLE SIGMOIDOSCOPY N/A 04/27/2018   Procedure: FLEXIBLE SIGMOIDOSCOPY;  Surgeon: 04/29/2018, MD;  [facility]: [facility];  Service: Endoscopy;  Laterality: N/A;  8:30   FOOT SURGERY Right 2005   Bunionectomy   ROBOTIC ASSISTED LAPAROSCOPIC LYSIS OF ADHESION N/A 09/19/2019   Procedure: XI ROBOTIC ASSISTED LAPAROSCOPIC LYSIS OF ADHESION/Ablation of Endometriosis;  Surgeon: 09/21/2019, MD;  Location: [facility];  Service: Gynecology;  Laterality: N/A;     OB History   No obstetric history on file.     Family History  Problem Relation Age of Onset   Prostate cancer Father    Hypertension Brother  Social History   Tobacco Use   Smoking status: Never   Smokeless tobacco: Never  Vaping Use   Vaping Use: Never used  Substance Use Topics   Alcohol use: No   Drug use: Never    Home Medications Prior to Admission medications   Medication Sig Start Date End Date Taking? Authorizing Provider  B Complex-C (B-COMPLEX WITH VITAMIN C) tablet Take 1 tablet by mouth daily.   Yes [provider]  CALCIUM PO Take 1 tablet by mouth daily.   Yes [provider]  cetirizine (ZYRTEC) 10 MG tablet Take 10  mg by mouth every evening.   Yes [provider]  CHOLECALCIFEROL PO Take 5,000 Int'l Units by mouth daily.   Yes [provider]  CRANBERRY PO Take 1 capsule by mouth daily.   Yes [provider]  cyclobenzaprine (FLEXERIL) 10 MG tablet Take 10 mg by mouth 3 (three) times daily as needed for muscle spasms.   Yes [provider]  Estradiol-Progesterone (BIJUVA) 1-100 MG CAPS Take by mouth at bedtime.   Yes [provider]  famotidine (PEPCID) 20 MG tablet Take 1 tablet (20 mg total) by mouth at bedtime as needed for heartburn or indigestion. 05/21/20  Yes Tawni Pummel B, PA-C  fluticasone (FLONASE) 50 MCG/ACT nasal spray Place 2 sprays into both nostrils daily.    Yes [provider]  furosemide (LASIX) 40 MG tablet Take 40 mg by mouth daily.  08/23/19  Yes [provider]  gabapentin (NEURONTIN) 300 MG capsule Take 300 mg by mouth at bedtime. Per pt prescribed is written one cap tid prn but she takes only one cap bedtime   Yes [provider]  lubiprostone (AMITIZA) 24 MCG capsule Take 1 capsule (24 mcg total) by mouth 2 (two) times daily with a meal. Patient taking differently: Take 24 mcg by mouth daily at 6 (six) AM. 05/21/20  Yes Tawni Pummel B, PA-C  Omega-3 Fatty Acids (FISH OIL) 1000 MG CAPS Take 1,000 mg by mouth at bedtime.   Yes [provider]  omeprazole (PRILOSEC) 20 MG capsule Take 1 capsule (20 mg total) by mouth daily. 05/21/20  Yes Tawni Pummel B, PA-C  ondansetron (ZOFRAN) 4 MG tablet Take 1 tablet (4 mg total) by mouth every 6 (six) hours. As needed for nausea 03/21/21  Yes Cherylann Hobday, PA-C  oxyCODONE-acetaminophen (PERCOCET/ROXICET) 5-325 MG tablet Take 1 tablet by mouth every 6 (six) hours as needed for severe pain. 03/21/21  Yes Jawan Chavarria, PA-C  potassium chloride SA (KLOR-CON) 20 MEQ tablet Take 20 mEq by mouth 2 (two) times daily.   Yes [provider]    Allergies     Erythromycin, Hydrocodone, and Tape  Review of Systems   Review of Systems  Constitutional:  Negative for chills, fatigue and fever.  Respiratory:  Negative for shortness of breath.   Cardiovascular:  Negative for chest pain.  Gastrointestinal:  Positive for nausea. Negative for abdominal pain, blood in stool, diarrhea and vomiting.  Genitourinary:  Positive for dysuria and flank pain. Negative for decreased urine volume, hematuria, pelvic pain, vaginal bleeding, vaginal discharge and vaginal pain.  Musculoskeletal:  Negative for arthralgias, back pain, myalgias, neck pain and neck stiffness.  Skin:  Negative for rash.  Neurological:  Negative for dizziness, weakness, numbness and headaches.  Hematological:  Does not bruise/bleed easily.   Physical Exam Updated Vital Signs BP 120/63 (BP Location: Left Arm)   Pulse (!) 102   Temp 98.1 F (  36.7 C) (Oral)   Resp 18   Ht 5\' 6"  (1.676 m)   Wt 90.7 kg   LMP 03/10/2021   SpO2 94%   BMI 32.28 kg/m   Physical Exam Vitals and nursing note reviewed.  Constitutional:      Appearance: Normal appearance. She is not ill-appearing or toxic-appearing.     Comments: Pt appear uncomfortable, non toxic  Cardiovascular:     Rate and Rhythm: Normal rate and regular rhythm.     Pulses: Normal pulses.  Pulmonary:     Effort: Pulmonary effort is normal.     Breath sounds: Normal breath sounds.  Abdominal:     Palpations: Abdomen is soft.     Tenderness: There is no abdominal tenderness. There is no right CVA tenderness, left CVA tenderness, guarding or rebound.  Musculoskeletal:        General: Normal range of motion.  Skin:    General: Skin is warm.     Findings: No rash.  Neurological:     General: No focal deficit present.     Mental Status: She is alert.     Sensory: No sensory deficit.     Motor: No weakness.    ED Results / Procedures / Treatments   Labs (all labs ordered are listed, but only abnormal results are  displayed) Labs Reviewed  URINALYSIS, ROUTINE W REFLEX MICROSCOPIC - Abnormal; Notable for the following components:      Result Value   Leukocytes,Ua TRACE (*)    Bacteria, UA RARE (*)    All other components within normal limits  BASIC METABOLIC PANEL - Abnormal; Notable for the following components:   Sodium 134 (*)    Glucose, Bld 101 (*)    Calcium 8.4 (*)    All other components within normal limits  CBC WITH DIFFERENTIAL/PLATELET - Abnormal; Notable for the following components:   WBC 11.2 (*)    Neutro Abs 9.1 (*)    All other components within normal limits  URINE CULTURE  PREGNANCY, URINE    EKG None  Radiology CT Renal Stone Study  Result Date: 03/21/2021 CLINICAL DATA:  Flank pain, kidney stone suspected EXAM: CT ABDOMEN AND PELVIS WITHOUT CONTRAST TECHNIQUE: Multidetector CT imaging of the abdomen and pelvis was performed following the standard protocol without IV contrast. COMPARISON:  None. FINDINGS: Lower chest: Included lung bases are clear.  Heart size is normal. Hepatobiliary: Subcentimeter low-density lesion within the lateral aspect of the right hepatic lobe, too small to definitively characterize. Otherwise unremarkable unenhanced appearance of the liver. Gallbladder within normal limits. No hyperdense gallstone. No biliary dilatation. Pancreas: Unremarkable. No pancreatic ductal dilatation or surrounding inflammatory changes. Spleen: Normal in size without focal abnormality. Adrenals/Urinary Tract: Unremarkable adrenal glands. Multiple punctate 1-2 mm stones within both kidneys. There is mild right perinephric stranding. No hydronephrosis of either kidney. No ureteral calculi identified. Urinary bladder is within normal limits for the degree of distension. Stomach/Bowel: Stomach is within normal limits. Appendix is surgically absent. No evidence of bowel wall thickening, distention, or inflammatory changes. Vascular/Lymphatic: No significant vascular findings are  present. No enlarged abdominal or pelvic lymph nodes. Reproductive: Retroverted uterus.  No adnexal masses. Other: No free fluid. No abdominopelvic fluid collection. No pneumoperitoneum. No abdominal wall hernia. Musculoskeletal: No acute or significant osseous findings. IMPRESSION: 1. Multiple punctate 1-2 mm stones within both kidneys. No hydronephrosis. 2. Mild right perinephric stranding, which may reflect a recently passed stone versus ascending urinary tract infection. Correlate with urinalysis. 3. Otherwise,  no acute abdominopelvic findings. Electronically Signed   By: Duanne Guess D.O.   On: 03/21/2021 16:06    Procedures Procedures   Medications Ordered in ED Medications  HYDROmorphone (DILAUDID) injection 1 mg (1 mg Intravenous Given 03/21/21 1438)  ondansetron (ZOFRAN) injection 4 mg (4 mg Intravenous Given 03/21/21 1440)  ketorolac (TORADOL) 30 MG/ML injection 30 mg (30 mg Intravenous Given 03/21/21 1612)    ED Course  I have reviewed the triage vital signs and the nursing notes.  Pertinent labs & imaging results that were available during my care of the patient were reviewed by me and considered in my medical decision making (see chart for details).    MDM Rules/Calculators/A&P                           Pt here with right sided flank pain and reported dysuria.  Evaluated by PCP one day prior and received IM rocephin.  Continuing to have flank pain nausea w/o vomiting or fever.    On exam, pt uncomfortable appearing.  Having right flankpain that is constant.  No hx of kidney stone.  Denies vaginal pain, discharge and abnml vaginal bleeding.  Doubt TOA, ectopic, and ovarian torsion.  Will obtain labs, urine and urine pregnancy along with CT  On recheck, pt resting comfortably, pain improved after IV pain medication.   Labs show urine preg neg, no significant electrolyte derangement or leukocytosis.  U/A with 6-10 WBC, and trace leukocytes.  CT renal stone study indicated right  perinephritic stranding.  This is possibly related to recently passed stone vs developing pyelonephritis.    I have discussed findings with pt and all questions answered.  She is feeling better and plan includes short course of pain medication, antiemetic and she will get previously prescribed abx filled.  Urine culture pending.  She appears appropriate for d/c home and return precautions discussed.   Final Clinical Impression(s) / ED Diagnoses Final diagnoses:  Right flank pain    Rx / DC Orders ED Discharge Orders          Ordered    oxyCODONE-acetaminophen (PERCOCET/ROXICET) 5-325 MG tablet  Every 6 hours PRN        03/21/21 1719    ondansetron (ZOFRAN) 4 MG tablet  Every 6 hours        03/21/21 1719             Pauline Aus, PA-C 03/22/21 2312    Vanetta Mulders, MD 03/31/21 1306

## 2021-03-23 LAB — URINE CULTURE: Culture: NO GROWTH

## 2021-03-26 ENCOUNTER — Other Ambulatory Visit (HOSPITAL_COMMUNITY): Payer: Self-pay

## 2021-03-26 DIAGNOSIS — D509 Iron deficiency anemia, unspecified: Secondary | ICD-10-CM

## 2021-03-27 ENCOUNTER — Other Ambulatory Visit: Payer: Self-pay

## 2021-03-27 ENCOUNTER — Inpatient Hospital Stay (HOSPITAL_COMMUNITY): Payer: No Typology Code available for payment source | Attending: Hematology

## 2021-03-27 DIAGNOSIS — M545 Low back pain, unspecified: Secondary | ICD-10-CM | POA: Insufficient documentation

## 2021-03-27 DIAGNOSIS — Z8042 Family history of malignant neoplasm of prostate: Secondary | ICD-10-CM | POA: Diagnosis not present

## 2021-03-27 DIAGNOSIS — K589 Irritable bowel syndrome without diarrhea: Secondary | ICD-10-CM | POA: Diagnosis not present

## 2021-03-27 DIAGNOSIS — Z79899 Other long term (current) drug therapy: Secondary | ICD-10-CM | POA: Diagnosis not present

## 2021-03-27 DIAGNOSIS — K909 Intestinal malabsorption, unspecified: Secondary | ICD-10-CM | POA: Diagnosis not present

## 2021-03-27 DIAGNOSIS — D509 Iron deficiency anemia, unspecified: Secondary | ICD-10-CM | POA: Diagnosis not present

## 2021-03-27 DIAGNOSIS — R200 Anesthesia of skin: Secondary | ICD-10-CM | POA: Insufficient documentation

## 2021-03-27 DIAGNOSIS — G8929 Other chronic pain: Secondary | ICD-10-CM | POA: Diagnosis not present

## 2021-03-27 DIAGNOSIS — Z9049 Acquired absence of other specified parts of digestive tract: Secondary | ICD-10-CM | POA: Diagnosis not present

## 2021-03-27 DIAGNOSIS — Z8719 Personal history of other diseases of the digestive system: Secondary | ICD-10-CM | POA: Insufficient documentation

## 2021-03-27 DIAGNOSIS — Z8249 Family history of ischemic heart disease and other diseases of the circulatory system: Secondary | ICD-10-CM | POA: Diagnosis not present

## 2021-03-27 DIAGNOSIS — Z888 Allergy status to other drugs, medicaments and biological substances status: Secondary | ICD-10-CM | POA: Insufficient documentation

## 2021-03-27 DIAGNOSIS — K644 Residual hemorrhoidal skin tags: Secondary | ICD-10-CM | POA: Diagnosis not present

## 2021-03-27 DIAGNOSIS — R5383 Other fatigue: Secondary | ICD-10-CM | POA: Diagnosis not present

## 2021-03-27 DIAGNOSIS — Z885 Allergy status to narcotic agent status: Secondary | ICD-10-CM | POA: Insufficient documentation

## 2021-03-27 DIAGNOSIS — Z881 Allergy status to other antibiotic agents status: Secondary | ICD-10-CM | POA: Insufficient documentation

## 2021-03-27 DIAGNOSIS — K219 Gastro-esophageal reflux disease without esophagitis: Secondary | ICD-10-CM | POA: Diagnosis not present

## 2021-03-27 LAB — CBC WITH DIFFERENTIAL/PLATELET
Abs Immature Granulocytes: 0.02 10*3/uL (ref 0.00–0.07)
Basophils Absolute: 0 10*3/uL (ref 0.0–0.1)
Basophils Relative: 1 %
Eosinophils Absolute: 0 10*3/uL (ref 0.0–0.5)
Eosinophils Relative: 0 %
HCT: 36.4 % (ref 36.0–46.0)
Hemoglobin: 12.2 g/dL (ref 12.0–15.0)
Immature Granulocytes: 0 %
Lymphocytes Relative: 21 %
Lymphs Abs: 1.7 10*3/uL (ref 0.7–4.0)
MCH: 31.6 pg (ref 26.0–34.0)
MCHC: 33.5 g/dL (ref 30.0–36.0)
MCV: 94.3 fL (ref 80.0–100.0)
Monocytes Absolute: 0.5 10*3/uL (ref 0.1–1.0)
Monocytes Relative: 6 %
Neutro Abs: 5.6 10*3/uL (ref 1.7–7.7)
Neutrophils Relative %: 72 %
Platelets: 304 10*3/uL (ref 150–400)
RBC: 3.86 MIL/uL — ABNORMAL LOW (ref 3.87–5.11)
RDW: 11.8 % (ref 11.5–15.5)
WBC: 7.8 10*3/uL (ref 4.0–10.5)
nRBC: 0 % (ref 0.0–0.2)

## 2021-03-27 LAB — COMPREHENSIVE METABOLIC PANEL
ALT: 18 U/L (ref 0–44)
AST: 14 U/L — ABNORMAL LOW (ref 15–41)
Albumin: 3.8 g/dL (ref 3.5–5.0)
Alkaline Phosphatase: 91 U/L (ref 38–126)
Anion gap: 10 (ref 5–15)
BUN: 11 mg/dL (ref 6–20)
CO2: 26 mmol/L (ref 22–32)
Calcium: 9 mg/dL (ref 8.9–10.3)
Chloride: 99 mmol/L (ref 98–111)
Creatinine, Ser: 0.89 mg/dL (ref 0.44–1.00)
GFR, Estimated: >60 mL/min (ref >60)
Glucose, Bld: 99 mg/dL (ref 70–99)
Potassium: 3.9 mmol/L (ref 3.5–5.1)
Sodium: 135 mmol/L (ref 135–145)
Total Bilirubin: 0.4 mg/dL (ref 0.3–1.2)
Total Protein: 7.8 g/dL (ref 6.5–8.1)

## 2021-03-27 LAB — IRON AND TIBC
Iron: 32 ug/dL (ref 28–170)
Saturation Ratios: 11 % (ref 10.4–31.8)
TIBC: 299 ug/dL (ref 250–450)
UIBC: 267 ug/dL

## 2021-03-27 LAB — VITAMIN B12: Vitamin B-12: 316 pg/mL (ref 180–914)

## 2021-03-27 LAB — VITAMIN D 25 HYDROXY (VIT D DEFICIENCY, FRACTURES): Vit D, 25-Hydroxy: 48.26 ng/mL (ref 30–100)

## 2021-03-27 LAB — LACTATE DEHYDROGENASE: LDH: 148 U/L (ref 98–192)

## 2021-03-27 LAB — FERRITIN: Ferritin: 213 ng/mL (ref 11–307)

## 2021-04-02 NOTE — Progress Notes (Signed)
[facility] [address], [place] [address]   CLINIC:  Medical Oncology/Hematology  PCP:  Practice, [facility] [address] [place] / [place] [address]  [phone]  REASON FOR VISIT:  Follow-up for microcytic anemia  PRIOR THERAPY: none  CURRENT THERAPY: Intermittent iron infusions  INTERVAL HISTORY:  Kaitlin [name], a 50 y.o. female, returns for routine follow-up for her microcytic anemia. Kaitlin [name] was last seen on 09/11/2019.  Today she reports feeling good. She denies denies any current bleeding or black stools. She reports occasional vaginal bleeding. She was placed on Bijuva due to starting menopause. Her constipation has improved with Amitiza. She is not currently taking iron tablets. She is taking vitamin D, calcium, and a B-complex vitamin.   REVIEW OF SYSTEMS:  Review of Systems  Constitutional:  Negative for appetite change and fatigue.  HENT:   Negative for nosebleeds.   Respiratory:  Negative for hemoptysis.   Gastrointestinal:  Negative for blood in stool and constipation (improved).  Genitourinary:  Positive for vaginal bleeding (occasional). Negative for hematuria.   Musculoskeletal:  Positive for arthralgias (3/10 L arm).  Neurological:  Positive for numbness.  All other systems reviewed and are negative.  PAST MEDICAL/SURGICAL HISTORY:  Past Medical History:  Diagnosis Date   Family history of adverse reaction to anesthesia    father and brother--- ponv   GERD (gastroesophageal reflux disease)    Hemorrhoids    History of low potassium    per pt 11/ 2014 ED visit @[facility]  with right arm pain, ekg normal , but had to have IV potassium, no issues since   Hypertension    followed by pcp   (09-13-2019  per pt never had stress test)   IBS (irritable bowel syndrome)    s/ chronic constipation   Iron deficiency anemia secondary to blood loss (chronic) followed by Dr 09-15-2019   d/t menstrual blood loss;  treated with  intermittant iron infusions   Menorrhagia    Microcytic anemia    Pelvic pain    PONV (postoperative nausea and vomiting)    Rosacea    Sciatica, right side    Past Surgical History:  Procedure Laterality Date   ANTERIOR CERVICAL DECOMP/DISCECTOMY FUSION N/A 06/12/2013   Procedure: ANTERIOR CERVICAL DECOMPRESSION/DISCECTOMY FUSION 1 LEVEL;  Surgeon: 06/14/2013, MD;  Location: [facility];  Service: Neurosurgery;  Laterality: N/A;  C5-6 Anterior cervical decompression/diskectomy/fusion   APPENDECTOMY  06/23/2011   BREAST BIOPSY Right 2014   per pt benign   COLONOSCOPY W/ POLYPECTOMY  2012   DIAGNOSTIC LAPAROSCOPY  2002   DILITATION & CURRETTAGE/HYSTROSCOPY WITH NOVASURE ABLATION N/A 09/19/2019   Procedure: DILATATION & CURETTAGE/HYSTEROSCOPY;  Surgeon: 09/21/2019, MD;  Location: [facility];  Service: Gynecology;  Laterality: N/A;   EYE SURGERY  2003   lasik   FLEXIBLE SIGMOIDOSCOPY N/A 04/27/2018   Procedure: FLEXIBLE SIGMOIDOSCOPY;  Surgeon: 04/29/2018, MD;  [facility];  Service: Endoscopy;  Laterality: N/A;  8:30   FOOT SURGERY Right 2005   Bunionectomy   ROBOTIC ASSISTED LAPAROSCOPIC LYSIS OF ADHESION N/A 09/19/2019   Procedure: XI ROBOTIC ASSISTED LAPAROSCOPIC LYSIS OF ADHESION/Ablation of Endometriosis;  Surgeon: 09/21/2019, MD;  Location: [facility];  Service: Gynecology;  Laterality: N/A;    SOCIAL HISTORY:  Social History   Socioeconomic History   Marital status: Married    Spouse name: Not on file   Number of children: Not on file  Years of education: Not on file   Highest education level: Not on file  Occupational History   Not on file  Tobacco Use   Smoking status: Never   Smokeless tobacco: Never  Vaping Use   Vaping Use: Never used  Substance and Sexual Activity   Alcohol use: No   Drug use: Never   Sexual activity: Not on file  Other Topics Concern   Not on file  Social History Narrative    Not on file   Social Determinants of Health   Financial Resource Strain: Not on file  Food Insecurity: Not on file  Transportation Needs: Not on file  Physical Activity: Not on file  Stress: Not on file  Social Connections: Not on file  Intimate Partner Violence: Not on file    FAMILY HISTORY:  Family History  Problem Relation Age of Onset   Prostate cancer Father    Hypertension Brother     CURRENT MEDICATIONS:  Current Outpatient Medications  Medication Sig Dispense Refill   B Complex-C (B-COMPLEX WITH VITAMIN C) tablet Take 1 tablet by mouth daily.     CALCIUM PO Take 1 tablet by mouth daily.     cetirizine (ZYRTEC) 10 MG tablet Take 10 mg by mouth every evening.     CHOLECALCIFEROL PO Take 5,000 Int'l Units by mouth daily.     CRANBERRY PO Take 1 capsule by mouth daily.     cyclobenzaprine (FLEXERIL) 10 MG tablet Take 10 mg by mouth 3 (three) times daily as needed for muscle spasms.     Estradiol-Progesterone (BIJUVA) 1-100 MG CAPS Take by mouth at bedtime.     famotidine (PEPCID) 20 MG tablet Take 1 tablet (20 mg total) by mouth at bedtime as needed for heartburn or indigestion.     fluticasone (FLONASE) 50 MCG/ACT nasal spray Place 2 sprays into both nostrils daily.      furosemide (LASIX) 40 MG tablet Take 40 mg by mouth daily.      gabapentin (NEURONTIN) 300 MG capsule Take 300 mg by mouth at bedtime. Per pt prescribed is written one cap tid prn but she takes only one cap bedtime     lubiprostone (AMITIZA) 24 MCG capsule Take 1 capsule (24 mcg total) by mouth 2 (two) times daily with a meal. (Patient taking differently: Take 24 mcg by mouth daily at 6 (six) AM.) 60 capsule 11   Omega-3 Fatty Acids (FISH OIL) 1000 MG CAPS Take 1,000 mg by mouth at bedtime.     omeprazole (PRILOSEC) 20 MG capsule Take 1 capsule (20 mg total) by mouth daily. 90 capsule 3   ondansetron (ZOFRAN) 4 MG tablet Take 1 tablet (4 mg total) by mouth every 6 (six) hours. As needed for nausea 10 tablet  0   oxyCODONE-acetaminophen (PERCOCET/ROXICET) 5-325 MG tablet Take 1 tablet by mouth every 6 (six) hours as needed for severe pain. 10 tablet 0   potassium chloride SA (KLOR-CON) 20 MEQ tablet Take 20 mEq by mouth 2 (two) times daily.     No current facility-administered medications for this visit.    ALLERGIES:  Allergies  Allergen Reactions   Erythromycin Itching   Hydrocodone Itching   Tape Hives, Itching, Swelling, Dermatitis and Rash    dermabond    PHYSICAL EXAM:  Performance status (ECOG): 0 - Asymptomatic  There were no vitals filed for this visit. Wt Readings from Last 3 Encounters:  03/21/21 200 lb (90.7 kg)  09/24/20 229 lb 4.5 oz (104 kg)  05/21/20 221 lb (100.2 kg)   Physical Exam Vitals reviewed.  Constitutional:      Appearance: Normal appearance.  Cardiovascular:     Rate and Rhythm: Normal rate and regular rhythm.     Pulses: Normal pulses.     Heart sounds: Normal heart sounds.  Pulmonary:     Effort: Pulmonary effort is normal.     Breath sounds: Normal breath sounds.  Neurological:     General: No focal deficit present.     Mental Status: She is alert and oriented to person, place, and time.  Psychiatric:        Mood and Affect: Mood normal.        Behavior: Behavior normal.    LABORATORY DATA:  I have reviewed the labs as listed.  CBC Latest Ref Rng & Units 03/27/2021 03/21/2021 09/17/2020  WBC 4.0 - 10.5 K/uL 7.8 11.2(H) 7.2  Hemoglobin 12.0 - 15.0 g/dL 63.8 93.7 34.2  Hematocrit 36.0 - 46.0 % 36.4 37.3 40.1  Platelets 150 - 400 K/uL 304 273 302   CMP Latest Ref Rng & Units 03/27/2021 03/21/2021 09/17/2020  Glucose 70 - 99 mg/dL 99 876(O) 99  BUN 6 - 20 mg/dL 11 12 13   Creatinine 0.44 - 1.00 mg/dL 1.15 7.26  Sodium 135 - 145 mmol/L 135 134(L) 140  Potassium 3.5 - 5.1 mmol/L 3.9 3.6 4.0  Chloride 98 - 111 mmol/L 99 103 106  CO2 22 - 32 mmol/L 26 25 25   Calcium 8.9 - 10.3 mg/dL 9.0 2.03) 8.9  Total Protein 6.5 - 8.1 g/dL 7.8 - 7.1   Total Bilirubin 0.3 - 1.2 mg/dL 0.4 - 0.4  Alkaline Phos 38 - 126 U/L 91 - 83  AST 15 - 41 U/L 14(L) - 17  ALT 0 - 44 U/L 18 - 18      Component Value Date/Time   RBC 3.86 (L) 03/27/2021 1526   MCV 94.3 03/27/2021 1526   MCH 31.6 03/27/2021 1526   MCHC 33.5 03/27/2021 1526   RDW 11.8 03/27/2021 1526   LYMPHSABS 1.7 03/27/2021 1526   MONOABS 0.5 03/27/2021 1526   EOSABS 0.0 03/27/2021 1526   BASOSABS 0.0 03/27/2021 1526    DIAGNOSTIC IMAGING:  I have independently reviewed the scans and discussed with the patient. CT Renal Stone Study  Result Date: 03/21/2021 CLINICAL DATA:  Flank pain, kidney stone suspected EXAM: CT ABDOMEN AND PELVIS WITHOUT CONTRAST TECHNIQUE: Multidetector CT imaging of the abdomen and pelvis was performed following the standard protocol without IV contrast. COMPARISON:  None. FINDINGS: Lower chest: Included lung bases are clear.  Heart size is normal. Hepatobiliary: Subcentimeter low-density lesion within the lateral aspect of the right hepatic lobe, too small to definitively characterize. Otherwise unremarkable unenhanced appearance of the liver. Gallbladder within normal limits. No hyperdense gallstone. No biliary dilatation. Pancreas: Unremarkable. No pancreatic ductal dilatation or surrounding inflammatory changes. Spleen: Normal in size without focal abnormality. Adrenals/Urinary Tract: Unremarkable adrenal glands. Multiple punctate 1-2 mm stones within both kidneys. There is mild right perinephric stranding. No hydronephrosis of either kidney. No ureteral calculi identified. Urinary bladder is within normal limits for the degree of distension. Stomach/Bowel: Stomach is within normal limits. Appendix is surgically absent. No evidence of bowel wall thickening, distention, or inflammatory changes. Vascular/Lymphatic: No significant vascular findings are present. No enlarged abdominal or pelvic lymph nodes. Reproductive: Retroverted uterus.  No adnexal masses. Other:  No free fluid. No abdominopelvic fluid collection. No pneumoperitoneum. No abdominal wall hernia. Musculoskeletal: No acute  or significant osseous findings. IMPRESSION: 1. Multiple punctate 1-2 mm stones within both kidneys. No hydronephrosis. 2. Mild right perinephric stranding, which may reflect a recently passed stone versus ascending urinary tract infection. Correlate with urinalysis. 3. Otherwise, no acute abdominopelvic findings. Electronically Signed   By: Duanne Guess D.O.   On: 03/21/2021 16:06     ASSESSMENT:  1.  Iron deficiency state: -She has iron deficiency state secondary to menstrual blood loss. -She cannot tolerate iron pills because of severe constipation. -Colonoscopy on 04/27/2018 shows normal colon, external hemorrhoids. - Last Feraheme infusion on 04/09/2020. - She is postmenopausal but has periodic vaginal bleeding.  She is currently on Bijuva with symptom control.     PLAN:  1.  Iron deficiency state: - Reviewed labs from 03/27/2021.  Ferritin is 213 and percent saturation is 11.  Hemoglobin is 12.2.  Vitamin B12 is normal at 316. - She is not on any oral iron supplements.  Based on her iron panel, she does not require any parenteral iron therapy. - RTC 6 months for follow-up with labs.   Orders placed this encounter:  No orders of the defined types were placed in this encounter.    Doreatha Massed, MD [facility] [phone]   I, Alda Ponder, am acting as a scribe for Dr. Doreatha Massed.  I, Doreatha Massed MD, have reviewed the above documentation for accuracy and completeness, and I agree with the above.

## 2021-04-03 ENCOUNTER — Other Ambulatory Visit: Payer: Self-pay

## 2021-04-03 ENCOUNTER — Inpatient Hospital Stay (HOSPITAL_COMMUNITY): Payer: No Typology Code available for payment source | Attending: Hematology | Admitting: Hematology

## 2021-04-03 VITALS — BP 143/85 | HR 94 | Temp 97.0°F | Resp 18 | Wt 217.0 lb

## 2021-04-03 DIAGNOSIS — Z79899 Other long term (current) drug therapy: Secondary | ICD-10-CM | POA: Diagnosis not present

## 2021-04-03 DIAGNOSIS — D509 Iron deficiency anemia, unspecified: Secondary | ICD-10-CM | POA: Diagnosis present

## 2021-04-03 NOTE — Patient Instructions (Signed)
[facility] at [facility] Discharge Instructions  You were seen and examined today by Dr. Katragadda. Please follow up as scheduled.    Thank you for choosing [facility] at [facility] to provide your oncology and hematology care.  To afford each patient quality time with our provider, please arrive at least 15 minutes before your scheduled appointment time.   If you have a lab appointment with the Cancer Center please come in thru the Main Entrance and check in at the main information desk.  You need to re-schedule your appointment should you arrive 10 or more minutes late.  We strive to give you quality time with our providers, and arriving late affects you and other patients whose appointments are after yours.  Also, if you no show three or more times for appointments you may be dismissed from the clinic at the providers discretion.     Again, thank you for choosing [facility].  Our hope is that these requests will decrease the amount of time that you wait before being seen by our physicians.       _____________________________________________________________  Should you have questions after your visit to  Cancer Center, please contact our office at [phone] and follow the prompts.  Our office hours are 8:00 a.m. and 4:30 p.m. Monday - Friday.  Please note that voicemails left after 4:00 p.m. may not be returned until the following business day.  We are closed weekends and major holidays.  You do have access to a nurse 24-7, just call the main number to the clinic [phone] and do not press any options, hold on the line and a nurse will answer the phone.    For prescription refill requests, have your pharmacy contact our office and allow 72 hours.    Due to Covid, you will need to wear a mask upon entering the hospital. If you do not have a mask, a mask will be given to you at the Main Entrance upon arrival. For  doctor visits, patients may have 1 support person age 18 or older with them. For treatment visits, patients can not have anyone with them due to social distancing guidelines and our immunocompromised population.      

## 2021-05-01 ENCOUNTER — Other Ambulatory Visit: Payer: Self-pay | Admitting: Family Medicine

## 2021-05-01 DIAGNOSIS — Z1231 Encounter for screening mammogram for malignant neoplasm of breast: Secondary | ICD-10-CM

## 2021-07-18 ENCOUNTER — Other Ambulatory Visit (INDEPENDENT_AMBULATORY_CARE_PROVIDER_SITE_OTHER): Payer: Self-pay | Admitting: Internal Medicine

## 2021-07-18 NOTE — Telephone Encounter (Signed)
Needs office visit. Last seen nov 2021

## 2021-09-09 ENCOUNTER — Other Ambulatory Visit (HOSPITAL_COMMUNITY): Payer: Self-pay | Admitting: Orthopedic Surgery

## 2021-09-09 ENCOUNTER — Other Ambulatory Visit: Payer: Self-pay | Admitting: Orthopedic Surgery

## 2021-09-09 DIAGNOSIS — M5126 Other intervertebral disc displacement, lumbar region: Secondary | ICD-10-CM

## 2021-09-11 ENCOUNTER — Ambulatory Visit (INDEPENDENT_AMBULATORY_CARE_PROVIDER_SITE_OTHER): Payer: No Typology Code available for payment source | Admitting: Gastroenterology

## 2021-09-11 ENCOUNTER — Other Ambulatory Visit: Payer: Self-pay

## 2021-09-11 ENCOUNTER — Encounter (INDEPENDENT_AMBULATORY_CARE_PROVIDER_SITE_OTHER): Payer: Self-pay | Admitting: Gastroenterology

## 2021-09-11 VITALS — BP 126/82 | HR 89 | Temp 98.7°F | Ht 66.0 in | Wt 207.1 lb

## 2021-09-11 DIAGNOSIS — K219 Gastro-esophageal reflux disease without esophagitis: Secondary | ICD-10-CM

## 2021-09-11 DIAGNOSIS — K5909 Other constipation: Secondary | ICD-10-CM

## 2021-09-11 DIAGNOSIS — B372 Candidiasis of skin and nail: Secondary | ICD-10-CM

## 2021-09-11 MED ORDER — LUBIPROSTONE 8 MCG PO CAPS: 8.0000 ug | ORAL_CAPSULE | Freq: Two times a day (BID) | ORAL | 1 refills | Status: DC

## 2021-09-11 MED ORDER — NYSTATIN 100000 UNIT/GM EX OINT: 1.0000 "application " | TOPICAL_OINTMENT | Freq: Two times a day (BID) | CUTANEOUS | 0 refills | Status: DC

## 2021-09-11 NOTE — Progress Notes (Signed)
? ?Referring Provider: [facility] ?Primary Care Physician:  [facility] ?Primary GI Physician: Rehman ? ?Chief Complaint  ?Patient presents with  ? Follow-up  ?  Patient here today for a follow up visit.On Amitiza for constipation, doing well on it.  ? ?HPI:   ?Kaitlin [name] is a 51 y.o. female with past medical history of GERD, Hemorrhoids, IBS, IDA, HTN, rosacea. ? ?Patient presenting today for follow up of constipation and GERD. ? ?Patient last seen in Nov  2021, maintained on Amitiza at that time for constipation with good result. Also on Omeprazole 20mg  with pepcid 20mg  in the evening, also doing well on that. She was maintained on both with recommended f/u in 1 year. ? ?Today, she States that she takes omeprazole 20mg  daily and pepcid 20mg  in the evening. Has very infrequent episodes of breakthrough reflux on her medication, usually both heartburn and acid regurgitation occur when she does have symptoms. Symptoms typically only occurs with something greasy. She states she has occasional dysphagia maybe with certain foods or larger pills but does not feel that it occurs very frequently. She states that she had an episode of dysphagia in [place] at [employer] that took a while to get the food to pass, she was eventually able to get it down, however, no other significant events of food bolus since. Reports she does sometimes feel a thickness in her throat, like maybe she has some post nasal drip, though takes daily antihistamine and fluticasone nasal spray.  Does not have have a sore throat or cough, no hoarseness.  ?  ?Having 1-2 liquid BMs on Amitiza . She reports that if she does not take the medicine she does not have a BM. She denies abdominal pain or cramping, no fecal incontinence but sometimes has fecal urgency. Appetite is good and she has had no unintentional weight loss.  ? ?She does tell me she has some redness and irritation to her belly button, noticed  this about 1 week ago. ?  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 Last CBC with hgb of 12.2 in September 2022 with Iron 32, TIBC 299, saturation 11% and ferritin 213. Follows with hematology for this. No current iron supplementation. Denies rectal bleeding or melena. ? ?EGD: 07/26/15 at [facility], normal ?Flex sig 04/27/18- The rectum, recto-sigmoid colon, sigmoid colon, descending colon, splenic flexure and ?transverse colon are normal. ?- External hemorrhoids. ?- Anal papilla(e) were hypertrophied. ?- No specimens collected. ?Last Colonoscopy 2017: diverticulosis ? ?Recommendations:  ?Overdue for colonoscopy in 2022 ? ?Past Medical History:  ?Diagnosis Date  ? Family history of adverse reaction to anesthesia   ? father and brother--- ponv  ? GERD (gastroesophageal reflux disease)   ? Hemorrhoids   ? History of low potassium   ? per pt 11/ 2014 ED visit @[facility]  with right arm pain, ekg normal , but had to have IV potassium, no issues since  ? Hypertension   ? followed by pcp   (09-13-2019  per pt never had stress test)  ? IBS (irritable bowel syndrome)   ? s/ chronic constipation  ? 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 d/t menstrual blood loss;  treated with intermittant iron infusions  ? Menorrhagia   ? Microcytic anemia   ? Pelvic pain   ? PONV (postoperative nausea and vomiting)   ? Rosacea   ? Sciatica, right side   ? ? ?  Past Surgical History:  ?Procedure Laterality Date  ? ANTERIOR CERVICAL DECOMP/DISCECTOMY FUSION N/A 06/12/2013  ? Procedure: ANTERIOR CERVICAL DECOMPRESSION/DISCECTOMY FUSION 1 LEVEL;  Surgeon: Barnett AbuHenry Elsner, MD;  Location: [facility];  Service: Neurosurgery;  Laterality: N/A;  C5-6 Anterior cervical decompression/diskectomy/fusion  ? APPENDECTOMY  06/23/2011  ? BREAST BIOPSY Right 2014  ? per pt benign  ? COLONOSCOPY W/ POLYPECTOMY  2012  ? DIAGNOSTIC LAPAROSCOPY  2002  ? DILITATION &  CURRETTAGE/HYSTROSCOPY WITH NOVASURE ABLATION N/A 09/19/2019  ? Procedure: DILATATION & CURETTAGE/HYSTEROSCOPY;  Surgeon: Olivia Mackieaavon, Richard, MD;  [facility];  Service: Gynecology;  Laterality: N/A;  ? EYE SURGERY  2003  ? lasik  ? FLEXIBLE SIGMOIDOSCOPY N/A 04/27/2018  ? Procedure: FLEXIBLE SIGMOIDOSCOPY;  Surgeon: Malissa Hippoehman, Najeeb U, MD;  [facility];  Service: Endoscopy;  Laterality: N/A;  8:30  ? FOOT SURGERY Right 2005  ? Bunionectomy  ? ROBOTIC ASSISTED LAPAROSCOPIC LYSIS OF ADHESION N/A 09/19/2019  ? Procedure: XI ROBOTIC ASSISTED LAPAROSCOPIC LYSIS OF ADHESION/Ablation of Endometriosis;  Surgeon: Olivia Mackieaavon, Richard, MD;  [facility];  Service: Gynecology;  Laterality: N/A;  ? ? ?Current Outpatient Medications  ?Medication Sig Dispense Refill  ? B Complex-C (B-COMPLEX WITH VITAMIN C) tablet Take 1 tablet by mouth daily.    ? CALCIUM PO Take 1 tablet by mouth daily.    ? cetirizine (ZYRTEC) 10 MG tablet Take 10 mg by mouth every evening.    ? CHOLECALCIFEROL PO Take 5,000 Int'l Units by mouth daily.    ? CRANBERRY PO Take 1 capsule by mouth daily.    ? cyclobenzaprine (FLEXERIL) 10 MG tablet Take 10 mg by mouth 3 (three) times daily as needed for muscle spasms.    ? Estradiol-Progesterone (BIJUVA) 1-100 MG CAPS Take by mouth at bedtime.    ? famotidine (PEPCID) 20 MG tablet Take 1 tablet (20 mg total) by mouth at bedtime as needed for heartburn or indigestion. (Patient taking differently: Take 20 mg by mouth at bedtime.)    ? fluticasone (FLONASE) 50 MCG/ACT nasal spray Place 2 sprays into both nostrils daily.     ? furosemide (LASIX) 40 MG tablet Take 40 mg by mouth daily.     ? gabapentin (NEURONTIN) 300 MG capsule Take 300 mg by mouth at bedtime. Per pt prescribed is written one cap tid prn but she takes only one cap bedtime    ? lubiprostone (AMITIZA) 24 MCG capsule TAKE ONE CAPSULE BY MOUTH TWICE DAILY WITH FOOD (Patient taking differently: 24 mcg daily  with breakfast.) 60 capsule 5  ? Omega-3 Fatty Acids (FISH OIL) 1000 MG CAPS Take 1,000 mg by mouth at bedtime.    ? omeprazole (PRILOSEC) 20 MG capsule Take 1 capsule (20 mg total) by mouth daily. 90 capsule 3  ? potassium chloride SA (KLOR-CON) 20 MEQ tablet Take 20 mEq by mouth 2 (two) times daily.    ? ?No current facility-administered medications for this visit.  ? ? ?Allergies as of 09/11/2021 - Review Complete 09/11/2021  ?Allergen Reaction Noted  ? Erythromycin Itching 06/09/2013  ? Hydrocodone Itching 05/15/2011  ? Tape Hives, Itching, Swelling, Dermatitis, and Rash 09/19/2019  ? ? ?Family History  ?Problem Relation Age of Onset  ? Prostate cancer Father   ? Hypertension Brother   ? ? ?Social History  ? ?Socioeconomic History  ? Marital status: Married  ?  Spouse name: Not on file  ? Number of children: Not on file  ? Years of  education: Not on file  ? Highest education level: Not on file  ?Occupational History  ? Not on file  ?Tobacco Use  ? Smoking status: Never  ? Smokeless tobacco: Never  ?Vaping Use  ? Vaping Use: Never used  ?Substance and Sexual Activity  ? Alcohol use: No  ? Drug use: Never  ? Sexual activity: Not on file  ?Other Topics Concern  ? Not on file  ?Social History Narrative  ? Not on file  ? ?Social Determinants of Health  ? ?Financial Resource Strain: Not on file  ?Food Insecurity: Not on file  ?Transportation Needs: Not on file  ?Physical Activity: Not on file  ?Stress: Not on file  ?Social Connections: Not on file  ? ?Review of systems ?General: negative for malaise, night sweats, fever, chills, weight loss ?Neck: Negative for lumps, goiter, pain and significant neck swelling ?Resp: Negative for cough, wheezing, dyspnea at rest ?CV: Negative for chest pain, leg swelling, palpitations, orthopnea ?GI: denies melena, hematochezia, nausea, vomiting, odyonophagia, early satiety or unintentional weight loss. +reflux +globus sensation +dysphagia +diarrhea ?MSK: Negative for joint pain or  swelling, back pain, and muscle pain. ?Derm: Negative for itching or rash +redness and irritation to belly button ?Psych: Denies depression, anxiety, memory loss, confusion. No homicidal or suicidal ideat

## 2021-09-11 NOTE — Progress Notes (Deleted)
States that she takes omeprazole 20mg  daily and pepcid in the evening. Has very infrequent episodes of breakthrough reflux on here medication, usually both heartburn and acid regurgitation. Usually only with something greasy. She states she has occasional dysphagia maybe with certain foods or larger pills but does not feel that it occurs very frequently. She states that she had an episode of food bolus in 2019 while at 2020. Does not have have a sore throat or cough, no hoarseness, reports some feelings of drainage in her throat.  ? ?Constipation is well controlled on amitza [employer], she states that if she does not take the mediation she does not go to the bathroom at all. She states that she may have 1-2 liquid BMs on this.  ? ?Denies rectal bleeding or melena.  ? ?Flex sig 04/27/18- The rectum, recto-sigmoid colon, sigmoid colon, descending colon, splenic flexure and ?transverse colon are normal. ?- External hemorrhoids. ?- Anal papilla(e) were hypertrophied. ?- No specimens collected. ? ?EGD: 07/26/15 at [facility], normal ?

## 2021-09-11 NOTE — Patient Instructions (Addendum)
I am sending amtiza 17mcg to your pharmacy, please start by taking this twice a day, if you are still having watery diarrhea, please cut back to once a day, let me know if this dose does not work well for you. ?Continue to avoid greasy, spicy, tomato based foods, caffeine, chocolate and alcohol as these can worsen reflux symptoms ?Let me know if your swallowing becomes worse or you continue to have reflux symptoms ?Make sure you are staying upright 2-3 hours after eating prior to lying down ?I am sending nystatin ointment for your belly button, apply this twice a day, please let me know if symptoms do not improve, keep area clean and dry, can use some talcum free powder in the area to keep moisture down as well. ?

## 2021-09-13 ENCOUNTER — Telehealth (INDEPENDENT_AMBULATORY_CARE_PROVIDER_SITE_OTHER): Payer: Self-pay | Admitting: Gastroenterology

## 2021-09-13 DIAGNOSIS — K5909 Other constipation: Secondary | ICD-10-CM | POA: Insufficient documentation

## 2021-09-13 DIAGNOSIS — B372 Candidiasis of skin and nail: Secondary | ICD-10-CM | POA: Insufficient documentation

## 2021-09-23 ENCOUNTER — Ambulatory Visit (HOSPITAL_COMMUNITY): Payer: No Typology Code available for payment source

## 2021-09-23 ENCOUNTER — Encounter (HOSPITAL_COMMUNITY): Payer: Self-pay

## 2021-10-01 ENCOUNTER — Inpatient Hospital Stay (HOSPITAL_COMMUNITY): Payer: No Typology Code available for payment source | Attending: Hematology

## 2021-10-01 DIAGNOSIS — Z79899 Other long term (current) drug therapy: Secondary | ICD-10-CM | POA: Insufficient documentation

## 2021-10-01 DIAGNOSIS — D509 Iron deficiency anemia, unspecified: Secondary | ICD-10-CM | POA: Diagnosis present

## 2021-10-01 DIAGNOSIS — E559 Vitamin D deficiency, unspecified: Secondary | ICD-10-CM | POA: Diagnosis not present

## 2021-10-01 LAB — COMPREHENSIVE METABOLIC PANEL
ALT: 16 U/L (ref 0–44)
AST: 13 U/L — ABNORMAL LOW (ref 15–41)
Albumin: 4.2 g/dL (ref 3.5–5.0)
Alkaline Phosphatase: 66 U/L (ref 38–126)
Anion gap: 7 (ref 5–15)
BUN: 13 mg/dL (ref 6–20)
CO2: 28 mmol/L (ref 22–32)
Calcium: 8.8 mg/dL — ABNORMAL LOW (ref 8.9–10.3)
Chloride: 105 mmol/L (ref 98–111)
Creatinine, Ser: 0.89 mg/dL (ref 0.44–1.00)
GFR, Estimated: >60 mL/min (ref >60)
Glucose, Bld: 93 mg/dL (ref 70–99)
Potassium: 3.8 mmol/L (ref 3.5–5.1)
Sodium: 140 mmol/L (ref 135–145)
Total Bilirubin: 0.7 mg/dL (ref 0.3–1.2)
Total Protein: 7.4 g/dL (ref 6.5–8.1)

## 2021-10-01 LAB — CBC WITH DIFFERENTIAL/PLATELET
Abs Immature Granulocytes: 0.02 10*3/uL (ref 0.00–0.07)
Basophils Absolute: 0 10*3/uL (ref 0.0–0.1)
Basophils Relative: 1 %
Eosinophils Absolute: 0 10*3/uL (ref 0.0–0.5)
Eosinophils Relative: 0 %
HCT: 38.2 % (ref 36.0–46.0)
Hemoglobin: 12.8 g/dL (ref 12.0–15.0)
Immature Granulocytes: 0 %
Lymphocytes Relative: 41 %
Lymphs Abs: 3 10*3/uL (ref 0.7–4.0)
MCH: 31.1 pg (ref 26.0–34.0)
MCHC: 33.5 g/dL (ref 30.0–36.0)
MCV: 92.7 fL (ref 80.0–100.0)
Monocytes Absolute: 0.4 10*3/uL (ref 0.1–1.0)
Monocytes Relative: 5 %
Neutro Abs: 3.8 10*3/uL (ref 1.7–7.7)
Neutrophils Relative %: 53 %
Platelets: 317 10*3/uL (ref 150–400)
RBC: 4.12 MIL/uL (ref 3.87–5.11)
RDW: 12.4 % (ref 11.5–15.5)
WBC: 7.3 10*3/uL (ref 4.0–10.5)
nRBC: 0 % (ref 0.0–0.2)

## 2021-10-01 LAB — FERRITIN: Ferritin: 96 ng/mL (ref 11–307)

## 2021-10-01 LAB — VITAMIN D 25 HYDROXY (VIT D DEFICIENCY, FRACTURES): Vit D, 25-Hydroxy: 29.5 ng/mL — ABNORMAL LOW (ref 30–100)

## 2021-10-01 LAB — IRON AND TIBC
Iron: 87 ug/dL (ref 28–170)
Saturation Ratios: 26 % (ref 10.4–31.8)
TIBC: 329 ug/dL (ref 250–450)
UIBC: 242 ug/dL

## 2021-10-01 LAB — LACTATE DEHYDROGENASE: LDH: 142 U/L (ref 98–192)

## 2021-10-01 LAB — VITAMIN B12: Vitamin B-12: 287 pg/mL (ref 180–914)

## 2021-10-02 ENCOUNTER — Other Ambulatory Visit (HOSPITAL_COMMUNITY): Payer: No Typology Code available for payment source

## 2021-10-08 ENCOUNTER — Ambulatory Visit (HOSPITAL_COMMUNITY)
Admission: RE | Admit: 2021-10-08 | Discharge: 2021-10-08 | Disposition: A | Discharge status: Home / Self Care | Payer: No Typology Code available for payment source | Source: Ambulatory Visit | Attending: Orthopedic Surgery | Admitting: Orthopedic Surgery

## 2021-10-08 DIAGNOSIS — M5126 Other intervertebral disc displacement, lumbar region: Secondary | ICD-10-CM | POA: Insufficient documentation

## 2021-10-09 ENCOUNTER — Inpatient Hospital Stay (HOSPITAL_COMMUNITY): Payer: No Typology Code available for payment source | Admitting: Hematology

## 2021-10-09 DIAGNOSIS — D509 Iron deficiency anemia, unspecified: Secondary | ICD-10-CM

## 2021-10-09 NOTE — Patient Instructions (Addendum)
[facility] at [facility] ?Discharge Instructions ? ?You were seen and examined today by Dr. Ellin Saba. He reviewed your most recent labs and everything looks good except Vitamin D is low. Continue taking Vitamin D and Calcium over the counter. Please keep follow up appointment in 6 months. ? ? ?Thank you for choosing [facility] at [facility] to provide your oncology and hematology care.  To afford each patient quality time with our provider, please arrive at least 15 minutes before your scheduled appointment time.  ? ?If you have a lab appointment with the Cancer Center please come in thru the Main Entrance and check in at the main information desk. ? ?You need to re-schedule your appointment should you arrive 10 or more minutes late.  We strive to give you quality time with our providers, and arriving late affects you and other patients whose appointments are after yours.  Also, if you no show three or more times for appointments you may be dismissed from the clinic at the providers discretion.     ?Again, thank you for choosing [facility].  Our hope is that these requests will decrease the amount of time that you wait before being seen by our physicians.       ?_____________________________________________________________ ? ?Should you have questions after your visit to [facility], please contact our office at [phone] and follow the prompts.  Our office hours are 8:00 a.m. and 4:30 p.m. Monday - Friday.  Please note that voicemails left after 4:00 p.m. may not be returned until the following business day.  We are closed weekends and major holidays.  You do have access to a nurse 24-7, just call the main number to the clinic [phone] and do not press any options, hold on the line and a nurse will answer the phone.   ? ?For prescription refill requests, have your pharmacy contact our office and allow 72 hours.   ? ?Due to Covid,  you will need to wear a mask upon entering the hospital. If you do not have a mask, a mask will be given to you at the Main Entrance upon arrival. For doctor visits, patients may have 1 support person age 24 or older with them. For treatment visits, patients can not have anyone with them due to social distancing guidelines and our immunocompromised population.  ? ?  ?

## 2021-10-09 NOTE — Progress Notes (Signed)
? ?[facility] ?[address] ?[place], [place] [address] ? ? ?CLINIC:  ?Medical Oncology/Hematology ? ?PCP:  ?[facility] ?[address] [place] / [place] [address]  ?[phone] ? ?REASON FOR VISIT:  ?Follow-up for microcytic anemia ? ?PRIOR THERAPY: none ? ?CURRENT THERAPY:  Intermittent iron infusions ? ?INTERVAL HISTORY:  ?Kaitlin [name], a 51 y.o. female, returns for routine follow-up for her microcytic anemia. Kaitlin [name] was last seen on 04/03/2021. ? ?Today she reports feeling good. She is not taking iron tablets. She denies bleeding, black stools, and fatigue. She is not having menses as she is taking BIJUVA. She is taking 5000 units of vitamin D and calcium. Her PCP is [facility] and she follows up with them every 6 months. She denies history of vitamin B-12 deficiency. She reports right sided back pain.  ? ?REVIEW OF SYSTEMS:  ?Review of Systems  ?Constitutional:  Negative for appetite change and fatigue.  ?HENT:   Negative for nosebleeds.   ?Respiratory:  Negative for hemoptysis.   ?Gastrointestinal:  Negative for blood in stool.  ?Genitourinary:  Positive for vaginal bleeding. Negative for hematuria and menstrual problem.   ?Musculoskeletal:  Positive for arthralgias (6/10 R hip).  ?All other systems reviewed and are negative. ? ?PAST MEDICAL/SURGICAL HISTORY:  ?Past Medical History:  ?Diagnosis Date  ? Family history of adverse reaction to anesthesia   ? father and brother--- ponv  ? GERD (gastroesophageal reflux disease)   ? Hemorrhoids   ? History of low potassium   ? per pt 11/ 2014 ED visit @[facility]  with right arm pain, ekg normal , but had to have IV potassium, no issues since  ? Hypertension   ? followed by pcp   (09-13-2019  per pt never had stress test)  ? IBS (irritable bowel syndrome)   ? s/ chronic constipation  ? Iron deficiency anemia secondary to blood loss (chronic) followed by Dr 09-15-2019  ? d/t menstrual blood loss;  treated with intermittant iron infusions   ? Menorrhagia   ? Microcytic anemia   ? Pelvic pain   ? PONV (postoperative nausea and vomiting)   ? Rosacea   ? Sciatica, right side   ? ?Past Surgical History:  ?Procedure Laterality Date  ? ANTERIOR CERVICAL DECOMP/DISCECTOMY FUSION N/A 06/12/2013  ? Procedure: ANTERIOR CERVICAL DECOMPRESSION/DISCECTOMY FUSION 1 LEVEL;  Surgeon: 06/14/2013, MD;  Location: [facility];  Service: Neurosurgery;  Laterality: N/A;  C5-6 Anterior cervical decompression/diskectomy/fusion  ? APPENDECTOMY  06/23/2011  ? BREAST BIOPSY Right 2014  ? per pt benign  ? COLONOSCOPY W/ POLYPECTOMY  2012  ? DIAGNOSTIC LAPAROSCOPY  2002  ? DILITATION & CURRETTAGE/HYSTROSCOPY WITH NOVASURE ABLATION N/A 09/19/2019  ? Procedure: DILATATION & CURETTAGE/HYSTEROSCOPY;  Surgeon: 09/21/2019, MD;  Location: [facility];  Service: Gynecology;  Laterality: N/A;  ? EYE SURGERY  2003  ? lasik  ? FLEXIBLE SIGMOIDOSCOPY N/A 04/27/2018  ? Procedure: FLEXIBLE SIGMOIDOSCOPY;  Surgeon: 04/29/2018, MD;  [facility];  Service: Endoscopy;  Laterality: N/A;  8:30  ? FOOT SURGERY Right 2005  ? Bunionectomy  ? ROBOTIC ASSISTED LAPAROSCOPIC LYSIS OF ADHESION N/A 09/19/2019  ? Procedure: XI ROBOTIC ASSISTED LAPAROSCOPIC LYSIS OF ADHESION/Ablation of Endometriosis;  Surgeon: 09/21/2019, MD;  Location: [facility];  Service: Gynecology;  Laterality: N/A;  ? ? ?SOCIAL HISTORY:  ?Social History  ? ?Socioeconomic History  ? Marital status: Married  ?  Spouse name: Not on file  ?  Number of children: Not on file  ? Years of education: Not on file  ? Highest education level: Not on file  ?Occupational History  ? Not on file  ?Tobacco Use  ? Smoking status: Never  ? Smokeless tobacco: Never  ?Vaping Use  ? Vaping Use: Never used  ?Substance and Sexual Activity  ? Alcohol use: No  ? Drug use: Never  ? Sexual activity: Not on file  ?Other Topics Concern  ? Not on file  ?Social History Narrative  ? Not on file  ? ?Social  Determinants of Health  ? ?Financial Resource Strain: Not on file  ?Food Insecurity: Not on file  ?Transportation Needs: Not on file  ?Physical Activity: Not on file  ?Stress: Not on file  ?Social Connections: Not on file  ?Intimate Partner Violence: Not on file  ? ? ?FAMILY HISTORY:  ?Family History  ?Problem Relation Age of Onset  ? Prostate cancer Father   ? Hypertension Brother   ? ? ?CURRENT MEDICATIONS:  ?Current Outpatient Medications  ?Medication Sig Dispense Refill  ? B Complex-C (B-COMPLEX WITH VITAMIN C) tablet Take 1 tablet by mouth daily.    ? CALCIUM PO Take 1 tablet by mouth daily.    ? cetirizine (ZYRTEC) 10 MG tablet Take 10 mg by mouth every evening.    ? CHOLECALCIFEROL PO Take 5,000 Int'l Units by mouth daily.    ? CRANBERRY PO Take 1 capsule by mouth daily.    ? cyclobenzaprine (FLEXERIL) 10 MG tablet Take 10 mg by mouth 3 (three) times daily as needed for muscle spasms.    ? Estradiol-Progesterone (BIJUVA) 1-100 MG CAPS Take by mouth at bedtime.    ? famotidine (PEPCID) 20 MG tablet Take 1 tablet (20 mg total) by mouth at bedtime as needed for heartburn or indigestion. (Patient taking differently: Take 20 mg by mouth at bedtime.)    ? fluticasone (FLONASE) 50 MCG/ACT nasal spray Place 2 sprays into both nostrils daily.     ? furosemide (LASIX) 40 MG tablet Take 40 mg by mouth daily.     ? gabapentin (NEURONTIN) 300 MG capsule Take 300 mg by mouth at bedtime. Per pt prescribed is written one cap tid prn but she takes only one cap bedtime    ? lubiprostone (AMITIZA) 8 MCG capsule Take 1 capsule (8 mcg total) by mouth 2 (two) times daily with a meal. 60 capsule 1  ? nystatin ointment (MYCOSTATIN) Apply 1 application. topically 2 (two) times daily. 30 g 0  ? Omega-3 Fatty Acids (FISH OIL) 1000 MG CAPS Take 1,000 mg by mouth at bedtime.    ? omeprazole (PRILOSEC) 20 MG capsule Take 1 capsule (20 mg total) by mouth daily. 90 capsule 3  ? potassium chloride SA (KLOR-CON) 20 MEQ tablet Take 20 mEq by  mouth 2 (two) times daily.    ? ?No current facility-administered medications for this visit.  ? ? ?ALLERGIES:  ?Allergies  ?Allergen Reactions  ? Erythromycin Itching  ? Hydrocodone Itching  ? Tape Hives, Itching, Swelling, Dermatitis and Rash  ?  dermabond  ? ? ?PHYSICAL EXAM:  ?Performance status (ECOG): 0 - Asymptomatic ? ?There were no vitals filed for this visit. ?Wt Readings from Last 3 Encounters:  ?09/11/21 207 lb 1.6 oz (93.9 kg)  ?04/03/21 217 lb (98.4 kg)  ?03/21/21 200 lb (90.7 kg)  ? ?Physical Exam ?Vitals reviewed.  ?Constitutional:   ?   Appearance: Normal appearance.  ?Cardiovascular:  ?   Rate and  Rhythm: Normal rate and regular rhythm.  ?   Pulses: Normal pulses.  ?   Heart sounds: Normal heart sounds.  ?Pulmonary:  ?   Effort: Pulmonary effort is normal.  ?   Breath sounds: Normal breath sounds.  ?Neurological:  ?   General: No focal deficit present.  ?   Mental Status: She is alert and oriented to person, place, and time.  ?Psychiatric:     ?   Mood and Affect: Mood normal.     ?   Behavior: Behavior normal.  ? ? ?LABORATORY DATA:  ?I have reviewed the labs as listed.  ? ?  Latest Ref Rng & Units 10/01/2021  ?  1:48 PM 03/27/2021  ?  3:26 PM 03/21/2021  ?  2:26 PM  ?CBC  ?WBC 4.0 - 10.5 K/uL 7.3   7.8   11.2    ?Hemoglobin 12.0 - 15.0 g/dL 86.512.8   78.412.2   69.612.9    ?Hematocrit 36.0 - 46.0 % 38.2   36.4   37.3    ?Platelets 150 - 400 K/uL 317   304   273    ? ? ?  Latest Ref Rng & Units 10/01/2021  ?  1:48 PM 03/27/2021  ?  3:26 PM 03/21/2021  ?  2:26 PM  ?CMP  ?Glucose 70 - 99 mg/dL 93   99   [id number]    ?BUN 6 - 20 mg/dL 13   11   12     ?Creatinine 0.44 - 1.00 mg/dL 2.840.89   1.320.89   4.400.84    ?Sodium 135 - 145 mmol/L 140   135   134    ?Potassium 3.5 - 5.1 mmol/L 3.8   3.9   3.6    ?Chloride 98 - 111 mmol/L 105   99   103    ?CO2 22 - 32 mmol/L 28   26   25     ?Calcium 8.9 - 10.3 mg/dL 8.8   9.0   8.4    ?Total Protein 6.5 - 8.1 g/dL 7.4   7.8     ?Total Bilirubin 0.3 - 1.2 mg/dL 0.7   0.4     ?Alkaline Phos 38 -  126 U/L 66   91     ?AST 15 - 41 U/L 13   14     ?ALT 0 - 44 U/L 16   18     ? ?   ?Component Value Date/Time  ? RBC 4.12 10/01/2021 1348  ? MCV 92.7 10/01/2021 1348  ? MCH 31.1 10/01/2021 1348  ? MCHC 33.5 04

## 2021-10-10 ENCOUNTER — Encounter (HOSPITAL_COMMUNITY): Payer: Self-pay | Admitting: Hematology

## 2021-12-03 ENCOUNTER — Other Ambulatory Visit (INDEPENDENT_AMBULATORY_CARE_PROVIDER_SITE_OTHER): Payer: Self-pay | Admitting: Gastroenterology

## 2022-01-27 ENCOUNTER — Other Ambulatory Visit: Payer: Self-pay | Admitting: Obstetrics and Gynecology

## 2022-02-01 ENCOUNTER — Other Ambulatory Visit (INDEPENDENT_AMBULATORY_CARE_PROVIDER_SITE_OTHER): Payer: Self-pay | Admitting: Gastroenterology

## 2022-02-02 ENCOUNTER — Encounter (HOSPITAL_BASED_OUTPATIENT_CLINIC_OR_DEPARTMENT_OTHER): Payer: Self-pay | Admitting: Obstetrics and Gynecology

## 2022-02-02 ENCOUNTER — Encounter (HOSPITAL_COMMUNITY): Payer: Self-pay | Admitting: Hematology

## 2022-02-02 NOTE — Progress Notes (Signed)
Spoke w/ via phone for pre-op interview--- pt Lab needs dos----   cbc, t&s, istat , ekg            Lab results------ no COVID test -----patient states asymptomatic no test needed Arrive at ------- 0645 on 02-09-2022 NPO after MN NO Solid Food.  Clear liquids from MN until--- 0545 Med rec completed Medications to take morning of surgery ----- amitiza, prilosec, flonase spray Diabetic medication ----- n/a Patient instructed no nail polish to be worn day of surgery Patient instructed to bring photo id and insurance card day of surgery Patient aware to have Driver (ride ) / caregiver for 24 hours after surgery -- husband, Kaitlin [name] Patient Special Instructions ----- n/a Pre-Op special Istructions ----- n/a Patient verbalized understanding of instructions that were given at this phone interview. Patient denies shortness of breath, chest pain, fever, cough at this phone interview.

## 2022-02-09 ENCOUNTER — Ambulatory Visit (HOSPITAL_BASED_OUTPATIENT_CLINIC_OR_DEPARTMENT_OTHER): Payer: Self-pay | Admitting: Anesthesiology

## 2022-02-09 ENCOUNTER — Other Ambulatory Visit: Payer: Self-pay

## 2022-02-09 ENCOUNTER — Encounter (HOSPITAL_BASED_OUTPATIENT_CLINIC_OR_DEPARTMENT_OTHER): Payer: Self-pay | Admitting: Obstetrics and Gynecology

## 2022-02-09 ENCOUNTER — Ambulatory Visit (HOSPITAL_BASED_OUTPATIENT_CLINIC_OR_DEPARTMENT_OTHER)
Admission: RE | Admit: 2022-02-09 | Discharge: 2022-02-09 | Disposition: A | Discharge status: Home / Self Care | Payer: Self-pay | Attending: Obstetrics and Gynecology | Admitting: Obstetrics and Gynecology

## 2022-02-09 ENCOUNTER — Encounter (HOSPITAL_BASED_OUTPATIENT_CLINIC_OR_DEPARTMENT_OTHER)
Admission: RE | Disposition: A | Discharge status: Home / Self Care | Payer: Self-pay | Source: Home / Self Care | Attending: Obstetrics and Gynecology

## 2022-02-09 DIAGNOSIS — E669 Obesity, unspecified: Secondary | ICD-10-CM | POA: Insufficient documentation

## 2022-02-09 DIAGNOSIS — K589 Irritable bowel syndrome without diarrhea: Secondary | ICD-10-CM | POA: Insufficient documentation

## 2022-02-09 DIAGNOSIS — N950 Postmenopausal bleeding: Secondary | ICD-10-CM | POA: Insufficient documentation

## 2022-02-09 DIAGNOSIS — N840 Polyp of corpus uteri: Secondary | ICD-10-CM | POA: Insufficient documentation

## 2022-02-09 DIAGNOSIS — K219 Gastro-esophageal reflux disease without esophagitis: Secondary | ICD-10-CM | POA: Insufficient documentation

## 2022-02-09 DIAGNOSIS — Z01818 Encounter for other preprocedural examination: Secondary | ICD-10-CM

## 2022-02-09 DIAGNOSIS — I10 Essential (primary) hypertension: Secondary | ICD-10-CM | POA: Insufficient documentation

## 2022-02-09 DIAGNOSIS — Z6836 Body mass index (BMI) 36.0-36.9, adult: Secondary | ICD-10-CM | POA: Insufficient documentation

## 2022-02-09 HISTORY — DX: Postmenopausal bleeding: N95.0

## 2022-02-09 HISTORY — DX: Irritable bowel syndrome with constipation: K58.1

## 2022-02-09 HISTORY — DX: Personal history of urinary calculi: Z87.442

## 2022-02-09 HISTORY — DX: Other intervertebral disc degeneration, lumbosacral region without mention of lumbar back pain or lower extremity pain: M51.379

## 2022-02-09 HISTORY — PX: DILATATION & CURETTAGE/HYSTEROSCOPY WITH MYOSURE: SHX6511

## 2022-02-09 HISTORY — DX: Other intervertebral disc degeneration, lumbosacral region: M51.37

## 2022-02-09 LAB — CBC
HCT: 39.2 % (ref 36.0–46.0)
Hemoglobin: 13.4 g/dL (ref 12.0–15.0)
MCH: 31.8 pg (ref 26.0–34.0)
MCHC: 34.2 g/dL (ref 30.0–36.0)
MCV: 93.1 fL (ref 80.0–100.0)
Platelets: 288 10*3/uL (ref 150–400)
RBC: 4.21 MIL/uL (ref 3.87–5.11)
RDW: 12.3 % (ref 11.5–15.5)
WBC: 7 10*3/uL (ref 4.0–10.5)
nRBC: 0 % (ref 0.0–0.2)

## 2022-02-09 LAB — TYPE AND SCREEN
ABO/RH(D): B NEG
Antibody Screen: NEGATIVE

## 2022-02-09 LAB — POCT I-STAT, CHEM 8
BUN: 11 mg/dL (ref 6–20)
Calcium, Ion: 1.11 mmol/L — ABNORMAL LOW (ref 1.15–1.40)
Chloride: 102 mmol/L (ref 98–111)
Creatinine, Ser: 0.7 mg/dL (ref 0.44–1.00)
Glucose, Bld: 105 mg/dL — ABNORMAL HIGH (ref 70–99)
HCT: 38 % (ref 36.0–46.0)
Hemoglobin: 12.9 g/dL (ref 12.0–15.0)
Potassium: 3.6 mmol/L (ref 3.5–5.1)
Sodium: 141 mmol/L (ref 135–145)
TCO2: 27 mmol/L (ref 22–32)

## 2022-02-09 SURGERY — DILATATION & CURETTAGE/HYSTEROSCOPY WITH MYOSURE
Anesthesia: General | Site: Vagina

## 2022-02-09 MED ORDER — SCOPOLAMINE 1 MG/3DAYS TD PT72
MEDICATED_PATCH | TRANSDERMAL | Status: AC
  Filled 2022-02-09: qty 1

## 2022-02-09 MED ORDER — OXYCODONE HCL 5 MG PO TABS: 5.0000 mg | ORAL_TABLET | Freq: Once | ORAL | Status: DC | PRN

## 2022-02-09 MED ORDER — OXYCODONE HCL 5 MG/5ML PO SOLN: 5.0000 mg | Freq: Once | ORAL | Status: DC | PRN

## 2022-02-09 MED ORDER — BUPIVACAINE HCL (PF) 0.25 % IJ SOLN
INTRAMUSCULAR | Status: DC | PRN
  Administered 2022-02-09: 20 mL

## 2022-02-09 MED ORDER — FENTANYL CITRATE (PF) 100 MCG/2ML IJ SOLN
INTRAMUSCULAR | Status: AC
  Filled 2022-02-09: qty 2

## 2022-02-09 MED ORDER — PROPOFOL 1000 MG/100ML IV EMUL
INTRAVENOUS | Status: AC
  Filled 2022-02-09: qty 100

## 2022-02-09 MED ORDER — VASOPRESSIN 20 UNIT/ML IV SOLN
INTRAVENOUS | Status: DC | PRN
  Administered 2022-02-09: 18 mL

## 2022-02-09 MED ORDER — ONDANSETRON HCL 4 MG/2ML IJ SOLN: 4.0000 mg | Freq: Once | INTRAMUSCULAR | Status: DC | PRN

## 2022-02-09 MED ORDER — PROPOFOL 500 MG/50ML IV EMUL
INTRAVENOUS | Status: AC
  Filled 2022-02-09: qty 100

## 2022-02-09 MED ORDER — MIDAZOLAM HCL 2 MG/2ML IJ SOLN
INTRAMUSCULAR | Status: AC
  Filled 2022-02-09: qty 2

## 2022-02-09 MED ORDER — CEFAZOLIN SODIUM-DEXTROSE 2-4 GM/100ML-% IV SOLN
INTRAVENOUS | Status: AC
  Filled 2022-02-09: qty 100

## 2022-02-09 MED ORDER — SCOPOLAMINE 1 MG/3DAYS TD PT72
1.0000 | MEDICATED_PATCH | TRANSDERMAL | Status: DC
  Administered 2022-02-09: 1.5 mg via TRANSDERMAL

## 2022-02-09 MED ORDER — VASOPRESSIN 20 UNIT/ML IV SOLN: INTRAVENOUS | Status: DC | PRN

## 2022-02-09 MED ORDER — POVIDONE-IODINE 10 % EX SWAB: 2.0000 | Freq: Once | CUTANEOUS | Status: DC

## 2022-02-09 MED ORDER — ACETAMINOPHEN 325 MG PO TABS: 325.0000 mg | ORAL_TABLET | ORAL | Status: DC | PRN

## 2022-02-09 MED ORDER — LIDOCAINE HCL (CARDIAC) PF 100 MG/5ML IV SOSY
PREFILLED_SYRINGE | INTRAVENOUS | Status: DC | PRN
  Administered 2022-02-09: 60 mg via INTRAVENOUS

## 2022-02-09 MED ORDER — CEFAZOLIN SODIUM-DEXTROSE 2-4 GM/100ML-% IV SOLN
2.0000 g | INTRAVENOUS | Status: AC
  Administered 2022-02-09: 2 g via INTRAVENOUS

## 2022-02-09 MED ORDER — FENTANYL CITRATE (PF) 100 MCG/2ML IJ SOLN
25.0000 ug | INTRAMUSCULAR | Status: DC | PRN
  Administered 2022-02-09 (×2): 50 ug via INTRAVENOUS

## 2022-02-09 MED ORDER — ONDANSETRON HCL 4 MG/2ML IJ SOLN
INTRAMUSCULAR | Status: DC | PRN
  Administered 2022-02-09: 4 mg via INTRAVENOUS

## 2022-02-09 MED ORDER — FENTANYL CITRATE (PF) 100 MCG/2ML IJ SOLN
INTRAMUSCULAR | Status: DC | PRN
Start: 2022-02-09 — End: 2022-02-09
  Administered 2022-02-09: 50 ug via INTRAVENOUS

## 2022-02-09 MED ORDER — DEXAMETHASONE SODIUM PHOSPHATE 4 MG/ML IJ SOLN
INTRAMUSCULAR | Status: DC | PRN
  Administered 2022-02-09: 10 mg via INTRAVENOUS

## 2022-02-09 MED ORDER — LACTATED RINGERS IV SOLN: INTRAVENOUS | Status: DC

## 2022-02-09 MED ORDER — ACETAMINOPHEN 160 MG/5ML PO SOLN: 325.0000 mg | ORAL | Status: DC | PRN

## 2022-02-09 MED ORDER — MEPERIDINE HCL 25 MG/ML IJ SOLN: 6.2500 mg | INTRAMUSCULAR | Status: DC | PRN

## 2022-02-09 MED ORDER — MIDAZOLAM HCL 2 MG/2ML IJ SOLN
INTRAMUSCULAR | Status: DC | PRN
  Administered 2022-02-09: 2 mg via INTRAVENOUS

## 2022-02-09 MED ORDER — KETOROLAC TROMETHAMINE 30 MG/ML IJ SOLN
INTRAMUSCULAR | Status: DC | PRN
  Administered 2022-02-09: 30 mg via INTRAVENOUS

## 2022-02-09 MED ORDER — SODIUM CHLORIDE 0.9 % IR SOLN
Status: DC | PRN
  Administered 2022-02-09: 3000 mL

## 2022-02-09 MED ORDER — PROPOFOL 10 MG/ML IV BOLUS
INTRAVENOUS | Status: DC | PRN
  Administered 2022-02-09: 150 mg via INTRAVENOUS

## 2022-02-09 MED ORDER — BUPIVACAINE HCL (PF) 0.25 % IJ SOLN
INTRAMUSCULAR | Status: AC
  Filled 2022-02-09: qty 60

## 2022-02-09 SURGICAL SUPPLY — 20 items
CATH ROBINSON RED A/P 16FR (CATHETERS) ×2 IMPLANT
DECANTER SPIKE VIAL GLASS SM (MISCELLANEOUS) ×4 IMPLANT
DEVICE MYOSURE LITE (MISCELLANEOUS) IMPLANT
DEVICE MYOSURE REACH (MISCELLANEOUS) ×1 IMPLANT
DRSG TELFA 3X8 NADH (GAUZE/BANDAGES/DRESSINGS) ×2 IMPLANT
GAUZE 4X4 16PLY ~~LOC~~+RFID DBL (SPONGE) ×2 IMPLANT
GLOVE BIO SURGEON STRL SZ7.5 (GLOVE) ×2 IMPLANT
GLOVE BIOGEL PI IND STRL 7.0 (GLOVE) ×1 IMPLANT
GLOVE BIOGEL PI INDICATOR 7.0 (GLOVE) ×1
GOWN STRL REUS W/TWL LRG LVL3 (GOWN DISPOSABLE) ×4 IMPLANT
IV NS IRRIG 3000ML ARTHROMATIC (IV SOLUTION) ×2 IMPLANT
KIT PROCEDURE FLUENT (KITS) ×2 IMPLANT
KIT TURNOVER CYSTO (KITS) ×2 IMPLANT
NDL SPNL 22GX3.5 QUINCKE BK (NEEDLE) ×1 IMPLANT
NEEDLE SPNL 22GX3.5 QUINCKE BK (NEEDLE) ×2 IMPLANT
PACK VAGINAL MINOR WOMEN LF (CUSTOM PROCEDURE TRAY) ×2 IMPLANT
PAD DRESSING TELFA 3X8 NADH (GAUZE/BANDAGES/DRESSINGS) ×1 IMPLANT
PAD OB MATERNITY 4.3X12.25 (PERSONAL CARE ITEMS) ×2 IMPLANT
SEAL CERVICAL OMNI LOK (ABLATOR) IMPLANT
SEAL ROD LENS SCOPE MYOSURE (ABLATOR) ×2 IMPLANT

## 2022-02-09 NOTE — Anesthesia Preprocedure Evaluation (Addendum)
Anesthesia Evaluation  Patient identified by MRN, date of birth, ID band Patient awake    Reviewed: Allergy & Precautions, H&P , NPO status , Patient's Chart, lab work & pertinent test results  History of Anesthesia Complications (+) PONV, Family history of anesthesia reaction and history of anesthetic complications  Airway Mallampati: II  TM Distance: >3 FB Neck ROM: Full    Dental no notable dental hx. (+) Teeth Intact, Dental Advisory Given   Pulmonary neg pulmonary ROS,    Pulmonary exam normal breath sounds clear to auscultation       Cardiovascular hypertension, Pt. on medications Normal cardiovascular exam Rhythm:Regular Rate:Normal     Neuro/Psych   negative psych ROS   GI/Hepatic Neg liver ROS, GERD  Medicated and Controlled,IBS, chronic constipation, hemorrhoids    Endo/Other  Obesity BMI 36  Renal/GU negative Renal ROS bladder dysfunction Female GU complaint Pelvic pain, menorrhagia     Musculoskeletal  (+) Arthritis , Osteoarthritis,  Right sciatica   Abdominal   Peds  Hematology  (+) Blood dyscrasia, anemia ,   Anesthesia Other Findings   Reproductive/Obstetrics negative OB ROS                            Anesthesia Physical  Anesthesia Plan  ASA: 2  Anesthesia Plan: General   Post-op Pain Management: Minimal or no pain anticipated   Induction: Intravenous  PONV Risk Score and Plan: 4 or greater and Ondansetron, Dexamethasone, Midazolam, Scopolamine patch - Pre-op and Treatment may vary due to age or medical condition  Airway Management Planned: LMA  Additional Equipment: None  Intra-op Plan:   Post-operative Plan: Extubation in OR  Informed Consent: I have reviewed the patients History and Physical, chart, labs and discussed the procedure including the risks, benefits and alternatives for the proposed anesthesia with the patient or authorized representative  who has indicated his/her understanding and acceptance.     Dental advisory given  Plan Discussed with: CRNA and Anesthesiologist  Anesthesia Plan Comments:        Anesthesia Quick Evaluation

## 2022-02-09 NOTE — Transfer of Care (Signed)
Immediate Anesthesia Transfer of Care Note  Patient: Kaitlin [name]  Procedure(s) Performed: DILATATION & CURETTAGE/HYSTEROSCOPY WITH MYOSURE (Vagina )  Patient Location: PACU  Anesthesia Type:General  Level of Consciousness: awake and patient cooperative  Airway & Oxygen Therapy: Patient Spontanous Breathing  Post-op Assessment: Report given to RN and Post -op Vital signs reviewed and stable  Post vital signs: Reviewed and stable  Last Vitals:  Vitals Value Taken Time  BP 150/83 02/09/22 0926  Temp    Pulse 94 02/09/22 0927  Resp 18 02/09/22 0927  SpO2 99 % 02/09/22 0927  Vitals shown include unvalidated device data.  Last Pain:  Vitals:   02/09/22 0729  TempSrc: Oral         Complications: No notable events documented.

## 2022-02-09 NOTE — Progress Notes (Signed)
Patient seen and examined. Consent witnessed and signed. No changes noted. Update completed. 

## 2022-02-09 NOTE — Anesthesia Procedure Notes (Signed)
Procedure Name: LMA Insertion Date/Time: 02/09/2022 8:55 AM  Performed by: Earmon Phoenix, CRNAPre-anesthesia Checklist: Patient identified, Patient being monitored, Emergency Drugs available, Timeout performed and Suction available Patient Re-evaluated:Patient Re-evaluated prior to induction Oxygen Delivery Method: Circle System Utilized Preoxygenation: Pre-oxygenation with 100% oxygen Induction Type: IV induction Ventilation: Mask ventilation without difficulty LMA: LMA inserted LMA Size: 4.0 Number of attempts: 1 Placement Confirmation: positive ETCO2 and breath sounds checked- equal and bilateral

## 2022-02-09 NOTE — H&P (Addendum)
Kaitlin [name] is an 51 y.o. female. PMB with structural lesion diag hysteroscopy.  Pertinent Gynecological History: Menses: post-menopausal Bleeding: post menopausal bleeding Contraception: none DES exposure: denies Blood transfusions: none Sexually transmitted diseases: no past history Previous GYN Procedures:  na   Last mammogram: normal Date: 2023 Last pap: normal Date: 2023 OB History: G1, P1   Menstrual History: Menarche age: 70 Patient's last menstrual period was 03/10/2021.    Past Medical History:  Diagnosis Date   DDD (degenerative disc disease), lumbosacral    Family history of adverse reaction to anesthesia    father and brother--- ponv   GERD (gastroesophageal reflux disease)    Hemorrhoids    History of kidney stones    History of low potassium    per pt 11/ 2014 ED visit @[facility]  with right arm pain, ekg normal , but had to have IV potassium, no issues since   Hypertension    followed by pcp   (09-13-2019  per pt never had stress test)   Iron deficiency anemia secondary to blood loss (chronic)    followed by Dr 09-15-2019 (hematology)    d/t uterine blood loss;  treated with intermittant iron infusions   Irritable bowel syndrome with constipation    Microcytic anemia    PMB (postmenopausal bleeding)    PONV (postoperative nausea and vomiting)    Rosacea    Sciatica, right side     Past Surgical History:  Procedure Laterality Date   ANTERIOR CERVICAL DECOMP/DISCECTOMY FUSION N/A 06/12/2013   Procedure: ANTERIOR CERVICAL DECOMPRESSION/DISCECTOMY FUSION 1 LEVEL;  Surgeon: 06/14/2013, MD;  Location: [facility];  Service: Neurosurgery;  Laterality: N/A;  C5-6 Anterior cervical decompression/diskectomy/fusion   APPENDECTOMY  06/23/2011   BREAST BIOPSY Right 2014   per pt benign   COLONOSCOPY W/ POLYPECTOMY  2012   DIAGNOSTIC LAPAROSCOPY  2002   DILITATION & CURRETTAGE/HYSTROSCOPY WITH NOVASURE ABLATION N/A 09/19/2019   Procedure: DILATATION &  CURETTAGE/HYSTEROSCOPY;  Surgeon: 09/21/2019, MD;  Location: [facility];  Service: Gynecology;  Laterality: N/A;   FLEXIBLE SIGMOIDOSCOPY N/A 04/27/2018   Procedure: FLEXIBLE SIGMOIDOSCOPY;  Surgeon: 04/29/2018, MD;  Location: [facility];  Service: Endoscopy;  Laterality: N/A;  8:30   FOOT SURGERY Right 2005   Bunionectomy   ROBOTIC ASSISTED LAPAROSCOPIC LYSIS OF ADHESION N/A 09/19/2019   Procedure: XI ROBOTIC ASSISTED LAPAROSCOPIC LYSIS OF ADHESION/Ablation of Endometriosis;  Surgeon: 09/21/2019, MD;  Location: [facility];  Service: Gynecology;  Laterality: N/A;    Family History  Problem Relation Age of Onset   Prostate cancer Father    Hypertension Brother     Social History:  reports that she has never smoked. She has never used smokeless tobacco. She reports that she does not drink alcohol and does not use drugs.  Allergies:  Allergies  Allergen Reactions   Erythromycin Itching   Hydrocodone Itching   Other Hives, Itching, Swelling and Rash    Surgical glue---- Dermabond     No medications prior to admission.    Review of Systems  Constitutional: Negative.   All other systems reviewed and are negative.   Height 5\' 6"  (1.676 m), weight 87.1 kg, last menstrual period 03/10/2021. Physical Exam Constitutional:      Appearance: Normal appearance. She is normal weight.  HENT:     Head: Normocephalic and atraumatic.  Cardiovascular:     Rate and Rhythm: Normal rate and regular rhythm.     Pulses: Normal pulses.  Heart sounds: Normal heart sounds.  Abdominal:     General: Abdomen is flat. Bowel sounds are normal.     Palpations: Abdomen is soft.  Genitourinary:    General: Normal vulva.  Musculoskeletal:        General: Normal range of motion.     Cervical back: Normal range of motion and neck supple.  Skin:    General: Skin is warm and dry.  Neurological:     General: No focal deficit present.     Mental  Status: She is alert and oriented to person, place, and time.  Psychiatric:        Mood and Affect: Mood normal.        Behavior: Behavior normal.      No results found for this or any previous visit (from the past 24 hour(s)).  No results found.  Assessment/Plan: PMB with structural lesion Diag HS, D&C with likely myosure Surgical consent done. Surgical risks discussed.  Dejah Droessler J 02/09/2022, 6:38 AM

## 2022-02-09 NOTE — Discharge Instructions (Signed)

## 2022-02-09 NOTE — Anesthesia Postprocedure Evaluation (Signed)
Anesthesia Post Note  Patient: Kaitlin [name]  Procedure(s) Performed: DILATATION & CURETTAGE/HYSTEROSCOPY WITH MYOSURE (Vagina )     Patient location during evaluation: PACU Anesthesia Type: General Level of consciousness: awake and alert Pain management: pain level controlled Vital Signs Assessment: post-procedure vital signs reviewed and stable Respiratory status: spontaneous breathing, nonlabored ventilation, respiratory function stable and patient connected to nasal cannula oxygen Cardiovascular status: blood pressure returned to baseline and stable Postop Assessment: no apparent nausea or vomiting Anesthetic complications: no   No notable events documented.  Last Vitals:  Vitals:   02/09/22 0935 02/09/22 1053  BP:  132/73  Pulse: 73 70  Resp: 15 16  Temp: 36.9 C 36.4 C  SpO2: 97% 100%    Last Pain:  Vitals:   02/09/22 1053  TempSrc: Oral  PainSc: 0-No pain                 Millena Callins

## 2022-02-09 NOTE — Op Note (Signed)
02/09/2022  9:17 AM  PATIENT:  Kaitlin [name]  51 y.o. female  PRE-OPERATIVE DIAGNOSIS:  Postmenopausal Bleeding  POST-OPERATIVE DIAGNOSIS:  Postmenopausal Bleeding and endometrial polyp  PROCEDURE:  Procedure(s): DILATATION & CURETTAGE HYSTEROSCOPY WITH MYOSURE RESECTION OF ENDOMETRIAL POLYP  SURGEON:  Surgeon(s): Olivia Mackie, MD  ASSISTANTS: none   ANESTHESIA:   local and general  ESTIMATED BLOOD LOSS: minimal, fluid deficit 80cc   DRAINS: none   LOCAL MEDICATIONS USED:  MARCAINE    and Amount: 20 ml  SPECIMEN:  Source of Specimen:  endometrial polyp, EMC  DISPOSITION OF SPECIMEN:  PATHOLOGY  COUNTS:  YES  DICTATION #: [id number]  PLAN OF CARE: dc home  PATIENT DISPOSITION:  PACU - hemodynamically stable.

## 2022-02-10 ENCOUNTER — Encounter (HOSPITAL_BASED_OUTPATIENT_CLINIC_OR_DEPARTMENT_OTHER): Payer: Self-pay | Admitting: Obstetrics and Gynecology

## 2022-02-10 LAB — SURGICAL PATHOLOGY

## 2022-02-10 NOTE — Op Note (Signed)
NAME: Kaitlin [name], PRE[facility]ON MEDICAL RECORD NO: [id number] ACCOUNT NO: [id number] DATE OF BIRTH: 07-10-1970 FACILITY: [facility] LOCATION: [facility] PHYSICIAN: Lenoard Aden, MD  Operative Report   DATE OF PROCEDURE: 02/09/2022  PREOPERATIVE DIAGNOSIS:  Postmenopausal bleeding with questionable structural lesion.  POSTOPERATIVE DIAGNOSES: 1.  Postmenopausal bleeding with questionable structural lesion 2.  Endometrial polyp.  PROCEDURE:  Diagnostic hysteroscopy, D and C, MyoSure resection of endometrial polyp.  SURGEON:  Lenoard Aden, MD.  ASSISTANT:  None.  ANESTHESIA:  Local general.  ESTIMATED BLOOD LOSS:  Minimal.  FLUID DEFICIT:  80 mL  COMPLICATIONS:  None.  DRAINS: None.  COUNTS:  Correct.  DISPOSITION:  The patient to recovery in good condition.  SPECIMENS:  Endometrial polyp and fragments and endometrial curettings to pathology sent together.  BRIEF OPERATIVE NOTE:  After being apprised of the risks of anesthesia, infection, bleeding, and surrounding organs, possible need for repair, delayed versus immediate complications to include bowel and bladder injury with possible need for repair, the  patient was brought to the operating room.  She was administered general anesthetic without complications.  She was prepped and draped in usual sterile fashion, catheterized until the bladder was empty.  Exam under anesthesia revealed a mid position to  retroflexed uterus. No adnexal masses appreciated.  At this time, cervix easily dilated up to a #19 Pratt dilator.  After placement of dilute Marcaine and dilute Pitressin solution in the standard fashion, at this time a hysteroscope was placed.   Visualization reveals a broad-based endometrial polyp at the anterior midline fundal area.  The MyoSure Reach device was placed and this was resected down to the base of the endometrium without difficulty.  Bilateral tubal ostia was noted.  No other  endometrial lesions are noted.   D and C was performed in a 4-quadrant method using sharp curettage, minimal tissue is collected.  At this time, the procedure was terminated.  Fluid deficit as noted.  The patient tolerated the procedure well and was  awakened and transferred to recovery in good condition.     PAA D: 02/09/2022 9:22:18 am T: 02/09/2022 11:59:00 pm  JOB: [id number]/ [id number]

## 2022-03-10 DIAGNOSIS — Z09 Encounter for follow-up examination after completed treatment for conditions other than malignant neoplasm: Secondary | ICD-10-CM | POA: Diagnosis not present

## 2022-04-09 ENCOUNTER — Encounter (HOSPITAL_COMMUNITY): Payer: Self-pay | Admitting: Hematology

## 2022-04-09 ENCOUNTER — Inpatient Hospital Stay: Payer: BC Managed Care – PPO | Attending: Hematology

## 2022-04-09 DIAGNOSIS — E611 Iron deficiency: Secondary | ICD-10-CM | POA: Diagnosis not present

## 2022-04-09 DIAGNOSIS — D509 Iron deficiency anemia, unspecified: Secondary | ICD-10-CM

## 2022-04-09 DIAGNOSIS — E559 Vitamin D deficiency, unspecified: Secondary | ICD-10-CM | POA: Diagnosis not present

## 2022-04-09 LAB — CBC WITH DIFFERENTIAL/PLATELET
Abs Immature Granulocytes: 0.02 10*3/uL (ref 0.00–0.07)
Basophils Absolute: 0 10*3/uL (ref 0.0–0.1)
Basophils Relative: 1 %
Eosinophils Absolute: 0.1 10*3/uL (ref 0.0–0.5)
Eosinophils Relative: 1 %
HCT: 38.1 % (ref 36.0–46.0)
Hemoglobin: 12.6 g/dL (ref 12.0–15.0)
Immature Granulocytes: 0 %
Lymphocytes Relative: 41 %
Lymphs Abs: 2.4 10*3/uL (ref 0.7–4.0)
MCH: 31.1 pg (ref 26.0–34.0)
MCHC: 33.1 g/dL (ref 30.0–36.0)
MCV: 94.1 fL (ref 80.0–100.0)
Monocytes Absolute: 0.3 10*3/uL (ref 0.1–1.0)
Monocytes Relative: 5 %
Neutro Abs: 3 10*3/uL (ref 1.7–7.7)
Neutrophils Relative %: 52 %
Platelets: 292 10*3/uL (ref 150–400)
RBC: 4.05 MIL/uL (ref 3.87–5.11)
RDW: 12.2 % (ref 11.5–15.5)
WBC: 5.7 10*3/uL (ref 4.0–10.5)
nRBC: 0 % (ref 0.0–0.2)

## 2022-04-09 LAB — IRON AND TIBC
Iron: 64 ug/dL (ref 28–170)
Saturation Ratios: 20 % (ref 10.4–31.8)
TIBC: 325 ug/dL (ref 250–450)
UIBC: 261 ug/dL

## 2022-04-09 LAB — FERRITIN: Ferritin: 74 ng/mL (ref 11–307)

## 2022-04-09 LAB — VITAMIN D 25 HYDROXY (VIT D DEFICIENCY, FRACTURES): Vit D, 25-Hydroxy: 20.89 ng/mL — ABNORMAL LOW (ref 30–100)

## 2022-04-16 ENCOUNTER — Ambulatory Visit: Payer: No Typology Code available for payment source | Admitting: Physician Assistant

## 2022-04-20 ENCOUNTER — Telehealth: Payer: Self-pay | Admitting: Physician Assistant

## 2022-05-11 ENCOUNTER — Encounter (INDEPENDENT_AMBULATORY_CARE_PROVIDER_SITE_OTHER): Payer: Self-pay | Admitting: Gastroenterology

## 2022-05-25 ENCOUNTER — Inpatient Hospital Stay: Payer: BC Managed Care – PPO | Attending: Hematology | Admitting: Physician Assistant

## 2022-05-25 VITALS — BP 147/91 | HR 90 | Temp 97.6°F | Resp 18 | Ht 66.14 in | Wt 203.7 lb

## 2022-05-25 DIAGNOSIS — E559 Vitamin D deficiency, unspecified: Secondary | ICD-10-CM

## 2022-05-25 DIAGNOSIS — E611 Iron deficiency: Secondary | ICD-10-CM | POA: Insufficient documentation

## 2022-05-25 MED ORDER — VITAMIN D (ERGOCALCIFEROL) 1.25 MG (50000 UNIT) PO CAPS: 50000.0000 [IU] | ORAL_CAPSULE | ORAL | 1 refills | Status: DC

## 2022-05-25 NOTE — Patient Instructions (Signed)
[facility] at [facility] **VISIT SUMMARY & IMPORTANT INSTRUCTIONS **   You were seen today by Rojelio Brenner PA-C for your iron deficiency.    IRON DEFICIENCY Since your iron levels are trending lower and you are starting to have some symptoms of increased fatigue and ice cravings, recommend treating you with IV iron x 1 dose.  VITAMIN D DEFICIENCY Prescription sent to your pharmacy for vitamin D 50,000 units.  Take this every other week.  FOLLOW-UP APPOINTMENT: Labs in 6 months with office visit 1 week after labs  ** Thank you for trusting me with your healthcare!  I strive to provide all of my patients with quality care at each visit.  If you receive a survey for this visit, I would be so grateful to you for taking the time to provide feedback.  Thank you in advance!  ~ Amery Vandenbos                   Dr. Doreatha Massed   &   Rojelio Brenner, PA-C   - - - - - - - - - - - - - - - - - -    Thank you for choosing [facility] at [facility] to provide your oncology and hematology care.  To afford each patient quality time with our provider, please arrive at least 15 minutes before your scheduled appointment time.   If you have a lab appointment with the Cancer Center please come in thru the Main Entrance and check in at the main information desk.  You need to re-schedule your appointment should you arrive 10 or more minutes late.  We strive to give you quality time with our providers, and arriving late affects you and other patients whose appointments are after yours.  Also, if you no show three or more times for appointments you may be dismissed from the clinic at the providers discretion.     Again, thank you for choosing [facility].  Our hope is that these requests will decrease the amount of time that you wait before being seen by our physicians.       _____________________________________________________________  Should  you have questions after your visit to [facility], please contact our office at [phone] and follow the prompts.  Our office hours are 8:00 a.m. and 4:30 p.m. Monday - Friday.  Please note that voicemails left after 4:00 p.m. may not be returned until the following business day.  We are closed weekends and major holidays.  You do have access to a nurse 24-7, just call the main number to the clinic [phone] and do not press any options, hold on the line and a nurse will answer the phone.    For prescription refill requests, have your pharmacy contact our office and allow 72 hours.

## 2022-05-25 NOTE — Progress Notes (Signed)
[facility] [address], [place] [address]   CLINIC:  Medical Oncology/Hematology  PCP:  Practice, [facility] [address] [place] [address] [phone]   REASON FOR VISIT:  Follow-up for iron deficiency anemia  CURRENT THERAPY: Intermittent IV iron  INTERVAL HISTORY:  Kaitlin [name] 51 y.o. female returns for routine follow-up of iron deficiency anemia.  She was last seen by Dr. Ellin Saba on 10/09/2021.  At today's visit, she reports feeling fair.  No recent hospitalizations, surgeries, or changes in baseline health status.  She remains on Bijuva since 2021 for treatment of her periodic vaginal bleeding and endometriosis, but does continue to have occasional breakthrough bleeding (most recently in the spring 2023) with removal of endometrial polyp in August 2023.  She denies any rectal bleeding or melena.  She reports that she is starting to feel "tired again" with increased fatigue and some recurrent ice pica.  She has 75% energy and 100% appetite. She endorses that she is maintaining a stable weight.   REVIEW OF SYSTEMS: Review of Systems  Constitutional:  Positive for fatigue. Negative for appetite change, chills, diaphoresis, fever and unexpected weight change.  HENT:   Negative for lump/mass and nosebleeds.   Eyes:  Negative for eye problems.  Respiratory:  Positive for cough. Negative for hemoptysis and shortness of breath.   Cardiovascular:  Negative for chest pain, leg swelling and palpitations.  Gastrointestinal:  Negative for abdominal pain, blood in stool, constipation, diarrhea, nausea and vomiting.  Genitourinary:  Negative for hematuria.   Musculoskeletal:  Positive for arthralgias and back pain.  Skin: Negative.   Neurological:  Positive for numbness. Negative for dizziness, headaches and light-headedness.  Hematological:  Does not bruise/bleed easily.      PAST MEDICAL/SURGICAL HISTORY:  Past Medical History:  Diagnosis Date    DDD (degenerative disc disease), lumbosacral    Family history of adverse reaction to anesthesia    father and brother--- ponv   GERD (gastroesophageal reflux disease)    Hemorrhoids    History of kidney stones    History of low potassium    per pt 11/ 2014 ED visit @[facility]  with right arm pain, ekg normal , but had to have IV potassium, no issues since   Hypertension    followed by pcp   (09-13-2019  per pt never had stress test)   Iron deficiency anemia secondary to blood loss (chronic)    followed by Dr 09-15-2019 (hematology)    d/t uterine blood loss;  treated with intermittant iron infusions   Irritable bowel syndrome with constipation    Microcytic anemia    PMB (postmenopausal bleeding)    PONV (postoperative nausea and vomiting)    Rosacea    Sciatica, right side    Past Surgical History:  Procedure Laterality Date   ANTERIOR CERVICAL DECOMP/DISCECTOMY FUSION N/A 06/12/2013   Procedure: ANTERIOR CERVICAL DECOMPRESSION/DISCECTOMY FUSION 1 LEVEL;  Surgeon: 06/14/2013, MD;  Location: [facility];  Service: Neurosurgery;  Laterality: N/A;  C5-6 Anterior cervical decompression/diskectomy/fusion   APPENDECTOMY  06/23/2011   BREAST BIOPSY Right 2014   per pt benign   COLONOSCOPY W/ POLYPECTOMY  2012   DIAGNOSTIC LAPAROSCOPY  2002   DILATATION & CURETTAGE/HYSTEROSCOPY WITH MYOSURE N/A 02/09/2022   Procedure: DILATATION & CURETTAGE/HYSTEROSCOPY WITH MYOSURE;  Surgeon: 02/11/2022, MD;  Location: [facility];  Service: Gynecology;  Laterality: N/A;   DILITATION & CURRETTAGE/HYSTROSCOPY WITH NOVASURE ABLATION N/A 09/19/2019   Procedure: DILATATION &  CURETTAGE/HYSTEROSCOPY;  Surgeon: Olivia Mackie, MD;  Location: [facility];  Service: Gynecology;  Laterality: N/A;   FLEXIBLE SIGMOIDOSCOPY N/A 04/27/2018   Procedure: FLEXIBLE SIGMOIDOSCOPY;  Surgeon: Malissa Hippo, MD;  Location: [facility];  Service: Endoscopy;  Laterality: N/A;  8:30    FOOT SURGERY Right 2005   Bunionectomy   ROBOTIC ASSISTED LAPAROSCOPIC LYSIS OF ADHESION N/A 09/19/2019   Procedure: XI ROBOTIC ASSISTED LAPAROSCOPIC LYSIS OF ADHESION/Ablation of Endometriosis;  Surgeon: Olivia Mackie, MD;  Location: [facility];  Service: Gynecology;  Laterality: N/A;     SOCIAL HISTORY:  Social History   Socioeconomic History   Marital status: Married    Spouse name: Not on file   Number of children: Not on file   Years of education: Not on file   Highest education level: Not on file  Occupational History   Not on file  Tobacco Use   Smoking status: Never   Smokeless tobacco: Never  Vaping Use   Vaping Use: Never used  Substance and Sexual Activity   Alcohol use: No   Drug use: Never   Sexual activity: Not on file  Other Topics Concern   Not on file  Social History Narrative   Not on file   Social Determinants of Health   Financial Resource Strain: Not on file  Food Insecurity: Not on file  Transportation Needs: Not on file  Physical Activity: Not on file  Stress: Not on file  Social Connections: Not on file  Intimate Partner Violence: Not on file    FAMILY HISTORY:  Family History  Problem Relation Age of Onset   Prostate cancer Father    Hypertension Brother     CURRENT MEDICATIONS:  Outpatient Encounter Medications as of 05/25/2022  Medication Sig Note   B Complex-C (B-COMPLEX WITH VITAMIN C) tablet Take 1 tablet by mouth daily.    CALCIUM PO Take 1 tablet by mouth daily.    cetirizine (ZYRTEC) 10 MG tablet Take 10 mg by mouth every evening.    CHOLECALCIFEROL PO Take 5,000 Int'l Units by mouth daily.    CRANBERRY PO Take 1 capsule by mouth as needed.    cyclobenzaprine (FLEXERIL) 10 MG tablet Take 10 mg by mouth 3 (three) times daily as needed for muscle spasms.    Estradiol-Progesterone (BIJUVA) 1-100 MG CAPS Take 1 capsule by mouth at bedtime.    famotidine (PEPCID) 20 MG tablet Take 1 tablet (20 mg total) by mouth  at bedtime as needed for heartburn or indigestion. (Patient taking differently: Take 20 mg by mouth at bedtime as needed.)    fluticasone (FLONASE) 50 MCG/ACT nasal spray Place 2 sprays into both nostrils daily.     furosemide (LASIX) 40 MG tablet Take 40 mg by mouth daily.     gabapentin (NEURONTIN) 300 MG capsule Take 300 mg by mouth at bedtime. Per pt prescribed is written one cap tid prn but she takes only one cap bedtime 09/13/2019: .   lubiprostone (AMITIZA) 8 MCG capsule TAKE ONE CAPSULE BY MOUTH TWICE DAILY WITH A MEAL (Patient taking differently: Take by mouth 2 (two) times daily with a meal.)    Omega-3 Fatty Acids (FISH OIL) 1000 MG CAPS Take 1,000 mg by mouth at bedtime. 03/21/2021: When pt remembers to take it   omeprazole (PRILOSEC) 20 MG capsule Take 1 capsule (20 mg total) by mouth daily.    potassium chloride SA (KLOR-CON) 20 MEQ tablet Take 20 mEq by mouth 2 (two)  times daily.    No facility-administered encounter medications on file as of 05/25/2022.    ALLERGIES:  Allergies  Allergen Reactions   Erythromycin Itching   Hydrocodone Itching   Other Hives, Itching, Swelling and Rash    Surgical glue---- Dermabond      PHYSICAL EXAM:  ECOG PERFORMANCE STATUS: 1 - Symptomatic but completely ambulatory  There were no vitals filed for this visit. There were no vitals filed for this visit. Physical Exam Constitutional:      Appearance: Normal appearance. She is obese.  HENT:     Head: Normocephalic and atraumatic.     Mouth/Throat:     Mouth: Mucous membranes are moist.  Eyes:     Extraocular Movements: Extraocular movements intact.     Pupils: Pupils are equal, round, and reactive to light.  Cardiovascular:     Rate and Rhythm: Normal rate and regular rhythm.     Pulses: Normal pulses.     Heart sounds: Normal heart sounds.  Pulmonary:     Effort: Pulmonary effort is normal.     Breath sounds: Normal breath sounds.  Abdominal:     General: Bowel sounds are  normal.     Palpations: Abdomen is soft.     Tenderness: There is no abdominal tenderness.  Musculoskeletal:        General: No swelling.     Right lower leg: No edema.     Left lower leg: No edema.  Lymphadenopathy:     Cervical: No cervical adenopathy.  Skin:    General: Skin is warm and dry.  Neurological:     General: No focal deficit present.     Mental Status: She is alert and oriented to person, place, and time.  Psychiatric:        Mood and Affect: Mood normal.        Behavior: Behavior normal.      LABORATORY DATA:  I have reviewed the labs as listed.  CBC    Component Value Date/Time   WBC 5.7 04/09/2022 1457   RBC 4.05 04/09/2022 1457   HGB 12.6 04/09/2022 1457   HCT 38.1 04/09/2022 1457   PLT 292 04/09/2022 1457   MCV 94.1 04/09/2022 1457   MCH 31.1 04/09/2022 1457   MCHC 33.1 04/09/2022 1457   RDW 12.2 04/09/2022 1457   LYMPHSABS 2.4 04/09/2022 1457   MONOABS 0.3 04/09/2022 1457   EOSABS 0.1 04/09/2022 1457   BASOSABS 0.0 04/09/2022 1457      Latest Ref Rng & Units 02/09/2022    7:37 AM 10/01/2021    1:48 PM 03/27/2021    3:26 PM  CMP  Glucose 70 - 99 mg/dL 062  93  99   BUN 6 - 20 mg/dL 11  13  11    Creatinine 0.44 - 1.00 mg/dL  3.76  2.83   Sodium 135 - 145 mmol/L 141  140  135   Potassium 3.5 - 5.1 mmol/L 3.6  3.8  3.9   Chloride 98 - 111 mmol/L 102  105  99   CO2 22 - 32 mmol/L  28  26   Calcium 8.9 - 10.3 mg/dL  8.8  9.0   Total Protein 6.5 - 8.1 g/dL  7.4  7.8   Total Bilirubin 0.3 - 1.2 mg/dL  0.7  0.4   Alkaline Phos 38 - 126 U/L  66  91   AST 15 - 41 U/L  13  14   ALT 0 - 44  U/L  16  18     DIAGNOSTIC IMAGING:  I have independently reviewed the relevant imaging and discussed with the patient.  ASSESSMENT & PLAN: 1.  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 -  Hemoccult stool negative x 3 in August 2020 - Cannot tolerate iron pills because of severe constipation. - Last Feraheme infusion on 04/09/2020. - She is currently on Bijuva for control of vaginal bleeding and endometriosis, with intermittent breakthrough bleeding. - She does not report any rectal bleeding or melena.   - Reviewed labs from 04/09/2022.  Ferritin is decreased to 74 and percent saturation 20%.  However hemoglobin is normal at 12.6.  Her energy levels are decreased. - PLAN: Recommend IV Venofer 400 mg x 1 dose due to recurrent fatigue and ice pica in the setting of ferritin <100. - RTC in 6 months with labs and office visit 1 week after   2.  Vitamin D deficiency - She is taking vitamin D3 5000 units every other day on average, but frequently forgets - Most recent vitamin D (04/09/2022) is trending downward at 20.89  - PLAN: Prescription sent to pharmacy for vitamin D 50,000 units every 2 weeks.   PLAN SUMMARY: >> Venofer 400 mg x 1 dose >> Labs in 6 months (CBC/D, ferritin, iron/TIBC, vitamin D) >> Office visit 1 week after labs   All questions were answered. The patient knows to call the clinic with any problems, questions or concerns.  Medical decision making: Low  Time spent on visit: I spent 15 minutes counseling the patient face to face. The total time spent in the appointment was 22 minutes and more than 50% was on counseling.   Kaitlin GuadalajaraRebekah M Doryce Mcgregory, PA-C  05/25/2022 10:57 PM

## 2022-05-26 ENCOUNTER — Other Ambulatory Visit: Payer: Self-pay

## 2022-05-26 DIAGNOSIS — E559 Vitamin D deficiency, unspecified: Secondary | ICD-10-CM

## 2022-05-26 DIAGNOSIS — D509 Iron deficiency anemia, unspecified: Secondary | ICD-10-CM

## 2022-06-08 ENCOUNTER — Ambulatory Visit: Payer: BC Managed Care – PPO

## 2022-06-12 ENCOUNTER — Inpatient Hospital Stay: Payer: BC Managed Care – PPO | Attending: Hematology

## 2022-06-12 VITALS — BP 138/88 | HR 70 | Temp 98.1°F | Resp 17

## 2022-06-12 DIAGNOSIS — E559 Vitamin D deficiency, unspecified: Secondary | ICD-10-CM | POA: Diagnosis not present

## 2022-06-12 DIAGNOSIS — E611 Iron deficiency: Secondary | ICD-10-CM | POA: Insufficient documentation

## 2022-06-12 DIAGNOSIS — D509 Iron deficiency anemia, unspecified: Secondary | ICD-10-CM

## 2022-06-12 MED ORDER — SODIUM CHLORIDE 0.9 % IV SOLN
400.0000 mg | Freq: Once | INTRAVENOUS | Status: AC
  Administered 2022-06-12: 400 mg via INTRAVENOUS
  Filled 2022-06-12: qty 20

## 2022-06-12 MED ORDER — LORATADINE 10 MG PO TABS
10.0000 mg | ORAL_TABLET | Freq: Once | ORAL | Status: AC
  Administered 2022-06-12: 10 mg via ORAL
  Filled 2022-06-12: qty 1

## 2022-06-12 MED ORDER — ACETAMINOPHEN 325 MG PO TABS
650.0000 mg | ORAL_TABLET | Freq: Once | ORAL | Status: AC
  Administered 2022-06-12: 650 mg via ORAL
  Filled 2022-06-12: qty 2

## 2022-06-12 MED ORDER — SODIUM CHLORIDE 0.9 % IV SOLN: Freq: Once | INTRAVENOUS | Status: AC

## 2022-06-12 NOTE — Progress Notes (Signed)
Patient tolerated iron infusion with no complaints voiced.  Peripheral IV site clean and dry with good blood return noted before and after infusion.  Band aid applied.  VSS with discharge and left in satisfactory condition with no s/s of distress noted.   

## 2022-06-12 NOTE — Progress Notes (Signed)
Pt presents today for Venofer IV iron infusion per provider's order. Vital signs stable and pt voiced no new complaints at this time.  Peripheral IV started with good blood return 

## 2022-06-12 NOTE — Patient Instructions (Signed)
[facility]  Discharge Instructions: Thank you for choosing [facility] to provide your oncology and hematology care.  If you have a lab appointment with the Cancer Center, please come in thru the Main Entrance and check in at the main information desk.  Wear comfortable clothing and clothing appropriate for easy access to any Portacath or PICC line.   We strive to give you quality time with your provider. You may need to reschedule your appointment if you arrive late (15 or more minutes).  Arriving late affects you and other patients whose appointments are after yours.  Also, if you miss three or more appointments without notifying the office, you may be dismissed from the clinic at the provider's discretion.      For prescription refill requests, have your pharmacy contact our office and allow 72 hours for refills to be completed.     To help prevent nausea and vomiting after your treatment, we encourage you to take your nausea medication as directed.  BELOW ARE SYMPTOMS THAT SHOULD BE REPORTED IMMEDIATELY: *FEVER GREATER THAN 100.4 F (38 C) OR HIGHER *CHILLS OR SWEATING *NAUSEA AND VOMITING THAT IS NOT CONTROLLED WITH YOUR NAUSEA MEDICATION *UNUSUAL SHORTNESS OF BREATH *UNUSUAL BRUISING OR BLEEDING *URINARY PROBLEMS (pain or burning when urinating, or frequent urination) *BOWEL PROBLEMS (unusual diarrhea, constipation, pain near the anus) TENDERNESS IN MOUTH AND THROAT WITH OR WITHOUT PRESENCE OF ULCERS (sore throat, sores in mouth, or a toothache) UNUSUAL RASH, SWELLING OR PAIN  UNUSUAL VAGINAL DISCHARGE OR ITCHING   Items with * indicate a potential emergency and should be followed up as soon as possible or go to the Emergency Department if any problems should occur.  Please show the CHEMOTHERAPY ALERT CARD or IMMUNOTHERAPY ALERT CARD at check-in to the Emergency Department and triage nurse.  Should you have questions after your visit or need to  cancel or reschedule your appointment, please contact [facility] [phone]  and follow the prompts.  Office hours are 8:00 a.m. to 4:30 p.m. Monday - Friday. Please note that voicemails left after 4:00 p.m. may not be returned until the following business day.  We are closed weekends and major holidays. You have access to a nurse at all times for urgent questions. Please call the main number to the clinic [phone] and follow the prompts.  For any non-urgent questions, you may also contact your provider using MyChart. We now offer e-Visits for anyone 18 and older to request care online for non-urgent symptoms. For details visit [email or web address].   Also download the MyChart app! Go to the app store, search "MyChart", open the app, select [facility], and log in with your MyChart username and password.  Masks are optional in the cancer centers. If you would like for your care team to wear a mask while they are taking care of you, please let them know. You may have one support person who is at least 50 years old accompany you for your appointments.  

## 2022-06-22 ENCOUNTER — Other Ambulatory Visit (INDEPENDENT_AMBULATORY_CARE_PROVIDER_SITE_OTHER): Payer: Self-pay | Admitting: Gastroenterology

## 2022-06-24 NOTE — Telephone Encounter (Signed)
Last seen 09/11/21.

## 2022-09-09 ENCOUNTER — Other Ambulatory Visit (INDEPENDENT_AMBULATORY_CARE_PROVIDER_SITE_OTHER): Payer: Self-pay | Admitting: Gastroenterology

## 2022-09-14 ENCOUNTER — Ambulatory Visit (INDEPENDENT_AMBULATORY_CARE_PROVIDER_SITE_OTHER): Payer: BC Managed Care – PPO | Admitting: Gastroenterology

## 2022-09-14 ENCOUNTER — Telehealth (INDEPENDENT_AMBULATORY_CARE_PROVIDER_SITE_OTHER): Payer: Self-pay | Admitting: Gastroenterology

## 2022-09-14 ENCOUNTER — Ambulatory Visit (INDEPENDENT_AMBULATORY_CARE_PROVIDER_SITE_OTHER): Payer: No Typology Code available for payment source | Admitting: Gastroenterology

## 2022-09-14 ENCOUNTER — Encounter (INDEPENDENT_AMBULATORY_CARE_PROVIDER_SITE_OTHER): Payer: Self-pay | Admitting: Gastroenterology

## 2022-09-14 VITALS — BP 130/83 | HR 89 | Ht 66.0 in | Wt 209.2 lb

## 2022-09-14 DIAGNOSIS — R1310 Dysphagia, unspecified: Secondary | ICD-10-CM | POA: Diagnosis not present

## 2022-09-14 DIAGNOSIS — K219 Gastro-esophageal reflux disease without esophagitis: Secondary | ICD-10-CM

## 2022-09-14 DIAGNOSIS — K5909 Other constipation: Secondary | ICD-10-CM

## 2022-09-14 MED ORDER — PANTOPRAZOLE SODIUM 40 MG PO TBEC: 40.0000 mg | DELAYED_RELEASE_TABLET | Freq: Every day | ORAL | 3 refills | Status: DC

## 2022-09-14 MED ORDER — IBSRELA 50 MG PO TABS: 50.0000 mg | ORAL_TABLET | Freq: Two times a day (BID) | ORAL | 3 refills | Status: DC

## 2022-09-14 NOTE — Telephone Encounter (Signed)
Pt in for office visit and needing TCS and EGD+/- ED. Pt is requesting Dr.Rourk because she knows him and used to work with him. Asked Chelsea about switching provider and Chelsea asked that we confer with Freight forwarder. Email sent to [place].   Pt was given samples of Clenpiq while in office.

## 2022-09-14 NOTE — Progress Notes (Unsigned)
Referring Provider: [facility] Primary Care Physician:  [facility] Primary GI Physician: Jenetta Downer   Chief Complaint  Patient presents with   Follow-up    Pt had rectal bleeding (blood on tissue) Saturday morning and this morning. Pt reports fullness feeling on Saturday morning   HPI:   Kaitlin [name] is a 52 y.o. female with past medical history of GERD, Hemorrhoids, IBS, IDA, HTN, rosacea.   Patient presenting today for follow up of GERD and constipation   Last seen march 2023, at that time, taking omeprazole 20mg  daily and pepcid 20mg  in the evening. Has very infrequent episodes of breakthrough reflux on her medication, usually both heartburn and acid regurgitation occur when she does have symptoms. she has occasional dysphagia maybe with certain foods or larger pills but does not feel that it occurs very frequently.  Reports she does sometimes feel a thickness in her throat, like maybe she has some post nasal drip, though takes daily antihistamine and fluticasone nasal spray.     Having 1-2 liquid BMs on Amitiza 70mcg. She reports that if she does not take the medicine she does not have a BM.    She does tell me she has some redness and irritation to her belly button, noticed this about 1 week ago.  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 Last CBC with hgb of 12.2 in September 2022 with Iron 32, TIBC 299, saturation 11% and ferritin 213. Follows with hematology for this. No current iron supplementation. Denies rectal bleeding or melena.  Recommended continue omeprazole and pepcid, amitiza 71mcg BID, EGD if dysphagia worsens, mycostatin ointment BID for umbilical candida , update colonoscopy, pt to make me aware when she wishes to proceed.  Present:  She notes that she has only occasional acid reflux on PPI and H2B. She notes continued globus sensation, seems somewhat worse than at last visit. Unsure  if this is from reflux or post nasal drip. She notes dysphagia has worsened over the past few months, she coughs more after eating thicker foods and is clearing her throat a lot as well. Denies ongoing sore throat. Meats tend to give her issues as well. No nausea or vomiting.   She is having constipation if she takes [place] 56mcg twice a day, she is having to take her two doses together in the morning. She does note that she sometimes gets a headache and feels flushed, notes more abdominal gurgling when taking this way. Will have maybe 2 BMs per day that are looser when doing this. She has some days where she may not go to the restroom, will feel bloated.  She was on linzess previously but this did not work well for her at all. She does feel that she has to strain some to have a BM even though stools are not formed, she had a large BM on wed or thurs then noted some pain when she defecated the day after. She notes an episode of blood on toilet tissue on Saturday morning, she has not had this in years, prior to this episode. She notes another episode of bleeding today but blood just noted on the toilet tissue both times. She does note that she felt full, and somewhat crampy on Friday. Did have a very large BM She notes that she did not have a BM when she noticed the blood on the tissue. She has not seen any blood with her BMs.  EGD: 07/26/15 at [facility], normal Flex sig 04/27/18- The rectum, recto-sigmoid colon, sigmoid colon, descending colon, splenic flexure and transverse colon are normal. - External hemorrhoids. - Anal papilla(e) were hypertrophied. - No specimens collected. Last Colonoscopy 2017: diverticulosis   Recommendations:  Overdue for colonoscopy in 2022   Past Medical History:  Diagnosis Date   DDD (degenerative disc disease), lumbosacral    Family history of adverse reaction to anesthesia    father and brother--- ponv   GERD (gastroesophageal reflux disease)    Hemorrhoids     History of kidney stones    History of low potassium    per pt 11/ 2014 ED visit @[facility]  with right arm pain, ekg normal , but had to have IV potassium, no issues since   Hypertension    followed by pcp   (09-13-2019  per pt never had stress test)   Iron deficiency anemia secondary to blood loss (chronic)    followed by Dr Delton Coombes (hematology)    d/t uterine blood loss;  treated with intermittant iron infusions   Irritable bowel syndrome with constipation    Microcytic anemia    PMB (postmenopausal bleeding)    PONV (postoperative nausea and vomiting)    Rosacea    Sciatica, right side     Past Surgical History:  Procedure Laterality Date   ANTERIOR CERVICAL DECOMP/DISCECTOMY FUSION N/A 06/12/2013   Procedure: ANTERIOR CERVICAL DECOMPRESSION/DISCECTOMY FUSION 1 LEVEL;  Surgeon: Kristeen Miss, MD;  Location: [facility];  Service: Neurosurgery;  Laterality: N/A;  C5-6 Anterior cervical decompression/diskectomy/fusion   APPENDECTOMY  06/23/2011   BREAST BIOPSY Right 2014   per pt benign   COLONOSCOPY W/ POLYPECTOMY  2012   DIAGNOSTIC LAPAROSCOPY  2002   DILATATION & CURETTAGE/HYSTEROSCOPY WITH MYOSURE N/A 02/09/2022   Procedure: [place];  Surgeon: Brien Few, MD;  Location: [facility];  Service: Gynecology;  Laterality: N/A;   DILITATION & CURRETTAGE/HYSTROSCOPY WITH NOVASURE ABLATION N/A 09/19/2019   Procedure: DILATATION & CURETTAGE/HYSTEROSCOPY;  Surgeon: Brien Few, MD;  Location: [facility];  Service: Gynecology;  Laterality: N/A;   FLEXIBLE SIGMOIDOSCOPY N/A 04/27/2018   Procedure: FLEXIBLE SIGMOIDOSCOPY;  Surgeon: Rogene Houston, MD;  Location: [facility];  Service: Endoscopy;  Laterality: N/A;  8:30   FOOT SURGERY Right 2005   Bunionectomy   ROBOTIC ASSISTED LAPAROSCOPIC LYSIS OF ADHESION N/A 09/19/2019   Procedure: XI ROBOTIC ASSISTED LAPAROSCOPIC LYSIS OF ADHESION/Ablation of  Endometriosis;  Surgeon: Brien Few, MD;  Location: [facility];  Service: Gynecology;  Laterality: N/A;    Current Outpatient Medications  Medication Sig Dispense Refill   B Complex-C (B-COMPLEX WITH VITAMIN C) tablet Take 1 tablet by mouth daily.     CALCIUM PO Take 1 tablet by mouth daily.     cetirizine (ZYRTEC) 10 MG tablet Take 10 mg by mouth every evening.     CHOLECALCIFEROL PO Take 5,000 Int'l Units by mouth daily.     CRANBERRY PO Take 1 capsule by mouth as needed.     cyclobenzaprine (FLEXERIL) 10 MG tablet Take 10 mg by mouth 3 (three) times daily as needed for muscle spasms.     Estradiol-Progesterone (BIJUVA) 1-100 MG CAPS Take 1 capsule by mouth at bedtime.     famotidine (PEPCID) 20 MG tablet Take 1 tablet (20 mg total) by mouth at bedtime as needed for heartburn or indigestion. (Patient taking differently: Take 20 mg by mouth at bedtime as needed.)  fluticasone (FLONASE) 50 MCG/ACT nasal spray Place 2 sprays into both nostrils daily.      furosemide (LASIX) 40 MG tablet Take 40 mg by mouth daily.      gabapentin (NEURONTIN) 300 MG capsule Take 300 mg by mouth at bedtime. Per pt prescribed is written one cap tid prn but she takes only one cap bedtime     lubiprostone (AMITIZA) 8 MCG capsule TAKE ONE CAPSULE BY MOUTH TWICE DAILY WITH A MEAL 180 capsule 0   mupirocin ointment (BACTROBAN) 2 % Apply 1 Application topically 3 (three) times daily.     Omega-3 Fatty Acids (FISH OIL) 1000 MG CAPS Take 1,000 mg by mouth at bedtime.     omeprazole (PRILOSEC) 20 MG capsule Take 1 capsule (20 mg total) by mouth daily. 90 capsule 3   potassium chloride SA (KLOR-CON) 20 MEQ tablet Take 20 mEq by mouth 2 (two) times daily.     Vitamin D, Ergocalciferol, (DRISDOL) 1.25 MG (50000 UNIT) CAPS capsule Take 1 capsule (50,000 Units total) by mouth every 14 (fourteen) days. 12 capsule 1   No current facility-administered medications for this visit.    Allergies as of  09/14/2022 - Review Complete 09/14/2022  Allergen Reaction Noted   Erythromycin Itching 06/09/2013   Hydrocodone Itching 05/15/2011   Other Hives, Itching, Swelling, and Rash 05/15/2011    Family History  Problem Relation Age of Onset   Prostate cancer Father    Hypertension Brother     Social History   Socioeconomic History   Marital status: Married    Spouse name: Not on file   Number of children: Not on file   Years of education: Not on file   Highest education level: Not on file  Occupational History   Not on file  Tobacco Use   Smoking status: Never   Smokeless tobacco: Never  Vaping Use   Vaping Use: Never used  Substance and Sexual Activity   Alcohol use: No   Drug use: Never   Sexual activity: Not on file  Other Topics Concern   Not on file  Social History Narrative   Not on file   Social Determinants of Health   Financial Resource Strain: Not on file  Food Insecurity: Not on file  Transportation Needs: Not on file  Physical Activity: Not on file  Stress: Not on file  Social Connections: Not on file   Review of systems General: negative for malaise, night sweats, fever, chills, weight loss Neck: Negative for lumps, goiter, pain and significant neck swelling Resp: Negative for cough, wheezing, dyspnea at rest CV: Negative for chest pain, leg swelling, palpitations, orthopnea GI: denies melena, hematochezia, nausea, vomiting, diarrhea, odyonophagia, early satiety or unintentional weight loss. +constipation +dysphagia  MSK: Negative for joint pain or swelling, back pain, and muscle pain. Derm: Negative for itching or rash.  Psych: Denies depression, anxiety, memory loss, confusion. No homicidal or suicidal ideation.  Heme: Negative for prolonged bleeding, bruising easily, and swollen nodes. Endocrine: Negative for cold or heat intolerance, polyuria, polydipsia and goiter. Neuro: negative for tremor, gait imbalance, syncope and seizures. The remainder of  the review of systems is noncontributory.  Physical Exam: BP 130/83   Pulse 89   Ht 5\' 6"  (1.676 m)   Wt 209 lb 3.2 oz (94.9 kg)   LMP 03/10/2021   BMI 33.77 kg/m  General:   Alert and oriented. No distress noted. Pleasant and cooperative.  Head:  Normocephalic and atraumatic. Eyes:  Conjuctiva clear without  scleral icterus. Mouth:  Oral mucosa pink and moist. Good dentition. No lesions. Heart: Normal rate and rhythm, s1 and s2 heart sounds present.  Lungs: Clear lung sounds in all lobes. Respirations equal and unlabored. Abdomen:  +BS, soft, non-tender and non-distended. No rebound or guarding. No HSM or masses noted. Derm: No palmar erythema or jaundice Msk:  Symmetrical without gross deformities. Normal posture. Extremities:  Without edema. Neurologic:  Alert and  oriented x4 Psych:  Alert and cooperative. Normal mood and affect.  Invalid input(s): "6 MONTHS"   ASSESSMENT: SONNIE GOLEC is a 52 y.o. female presenting today for follow up of GERD and Constipation  GERD/dysphagia: occasional acid reflux, though noting globus sensation often as well as worsening dysphagia with thicker foods. Will stop omeprazole and start protonix 40mg  daily, can trial off of famotidine when starting protonix, however, if she continues to have symptoms she can resume H2B. Recommend proceeding with EGD +/- dilation for further evaluation of her symptoms as I cannot rule out esophageal ring, web, stricture, stenosis.   Constipation: taking amitiza 75mcg BID, sometimes takes both doses together. Having 0-2 BMs per day. Had 2 episodes of blood noted on toilet tissue though this was not at time of BM, unclear If this was rectal bleeding or not. Given she does not feel that Baker Pierini is providing good results, will stop this and start Ibsrela 50mg  BID. Encouraged Increase water intake, aim for atleast 64 oz per day, Increase fruits, veggies and whole grains, kiwi and prunes are especially good for  constipation. She is overdue for colonoscopy given history of tubular adenomas, recommend proceeding with colonoscopy at time of EGD, as above. Indications, risks and benefits of procedure discussed in detail with patient. Patient verbalized understanding and is in agreement to proceed with EGD +/- dilation/colonoscopy.    PLAN:  Stop omeprazole  2. Start protonix 40mg  daily  3. Can try holding off on famotidine unless having breakthrough symptoms  4. Stop amitiza, start ibsrela 50mg  BID  5. Increase water intake, aim for atleast 64 oz per day Increase fruits, veggies and whole grains, kiwi and prunes are especially good for constipation 6. Schedule Colonoscopy and EGD +/- dilation -ASA II   All questions were answered, patient verbalized understanding and is in agreement with plan as outlined above.    Follow Up: 3 months   Nahome Bublitz L. Alver Sorrow, MSN, APRN, AGNP-C Adult-Gerontology Nurse Practitioner [facility]  I have reviewed the note and agree with the APP's assessment as described in this progress note  Depending on endoscopic findings, may consider proceeding with esophageal manometry and pH impedance testing on PPI.  Maylon Peppers, MD Gastroenterology and Hepatology [facility]

## 2022-09-14 NOTE — Patient Instructions (Signed)
Stop omeprazole  Start protonix 40mg  daily  Can try holding off on famotidine, can restart if still having symptoms on protonix   Stop amitiza, start ibsrela 50mg  twice daily  Increase water intake, aim for atleast 64 oz per day Increase fruits, veggies and whole grains, kiwi and prunes are especially good for constipation We will Schedule Colonoscopy and EGD  Avoid thicker, drier foods, chew thoroughly, taking sips of liquids between bites Avoid greasy, spicy, fried, citrus foods, and be mindful that caffeine, carbonated drinks, chocolate and alcohol can increase reflux symptoms Stay upright 2-3 hours after eating, prior to lying down and avoid eating late in the evenings.  Follow up 3 months  It was a pleasure to see you today. I want to create trusting relationships with patients and provide genuine, compassionate, and quality care. I truly value your feedback! please be on the lookout for a survey regarding your visit with me today. I appreciate your input about our visit and your time in completing this!    Karle Desrosier L. Alver Sorrow, MSN, APRN, AGNP-C Adult-Gerontology Nurse Practitioner [facility] at [facility]

## 2022-09-15 DIAGNOSIS — R1310 Dysphagia, unspecified: Secondary | ICD-10-CM | POA: Insufficient documentation

## 2022-09-16 NOTE — Addendum Note (Signed)
Addended by: Harvel Quale on: 09/16/2022 07:44 PM   Modules accepted: Level of Service

## 2022-09-18 NOTE — Telephone Encounter (Signed)
Pt contacted and advised that if she would like Dr.Rourk to complete procedures she would need to have an office visit with him. Pt states she did not want to have to go through another office visit. If he can't go on what [place] said.  Pt scheduled for 10/16/22(pt preferred Fridays). Pt was given Clinpiq samples at office visit. Instructions mailed to patient. Cancellation policy also included in instructions.

## 2022-09-18 NOTE — Telephone Encounter (Signed)
Per Shelly Bombard via email  I would if the patient prefers dr Gala Romney just go ahead and schedule with him.  Patient satisfaction is our goal!

## 2022-09-22 ENCOUNTER — Telehealth (INDEPENDENT_AMBULATORY_CARE_PROVIDER_SITE_OTHER): Payer: Self-pay | Admitting: Gastroenterology

## 2022-09-22 ENCOUNTER — Other Ambulatory Visit (INDEPENDENT_AMBULATORY_CARE_PROVIDER_SITE_OTHER): Payer: Self-pay | Admitting: *Deleted

## 2022-09-22 NOTE — Telephone Encounter (Signed)
Left detailed message on pt voicemail (ok per DPR) with pre op appt. Pre op scheduled for 10/13/22 10:00 am telephone call.

## 2022-09-23 ENCOUNTER — Telehealth: Payer: Self-pay | Admitting: *Deleted

## 2022-09-23 NOTE — Telephone Encounter (Signed)
Patient prescribed ibsrela. Her chart has chronic constipation as diagnosis. To get med approved she needs to have IBS - C. Do you want to change med or we have a lot of samples of this med we could give her.

## 2022-09-24 NOTE — Telephone Encounter (Signed)
Received a fax from [employer] stating prior auth not handled by [facility] and to contact customer service using the number on the back of their prescription benefits care to submit prior authorization.

## 2022-09-24 NOTE — Telephone Encounter (Signed)
PA submitted through cover my meds with diagnosis code IBS -c.

## 2022-09-24 NOTE — Telephone Encounter (Signed)
Called patient and she does have samples and will let us know when she is getting low so she does not run out til I can call insurance to submit PA.

## 2022-09-24 NOTE — Telephone Encounter (Signed)
PA submitted through cover my meds and approved from 09/24/22 -09/24/23. [facility] called and notified. Was told med went through with zero co pay and pharmacy would reach out to the patient. I called the patient and let her know on her voicemail  that med was approved and [facility] would reach out to her.

## 2022-10-13 ENCOUNTER — Encounter (HOSPITAL_COMMUNITY)
Admission: RE | Admit: 2022-10-13 | Discharge: 2022-10-13 | Disposition: A | Discharge status: Home / Self Care | Payer: BC Managed Care – PPO | Source: Ambulatory Visit | Attending: Gastroenterology | Admitting: Gastroenterology

## 2022-10-13 ENCOUNTER — Other Ambulatory Visit: Payer: Self-pay

## 2022-10-13 ENCOUNTER — Encounter (HOSPITAL_COMMUNITY): Payer: Self-pay

## 2022-10-16 ENCOUNTER — Encounter (HOSPITAL_COMMUNITY)
Admission: RE | Disposition: A | Discharge status: Home / Self Care | Payer: Self-pay | Source: Home / Self Care | Attending: Gastroenterology

## 2022-10-16 ENCOUNTER — Encounter (HOSPITAL_COMMUNITY): Payer: Self-pay | Admitting: Gastroenterology

## 2022-10-16 ENCOUNTER — Ambulatory Visit (HOSPITAL_COMMUNITY)
Admission: RE | Admit: 2022-10-16 | Discharge: 2022-10-16 | Disposition: A | Discharge status: Home / Self Care | Payer: BC Managed Care – PPO | Attending: Gastroenterology | Admitting: Gastroenterology

## 2022-10-16 ENCOUNTER — Ambulatory Visit (HOSPITAL_COMMUNITY): Payer: BC Managed Care – PPO | Admitting: Anesthesiology

## 2022-10-16 ENCOUNTER — Other Ambulatory Visit: Payer: Self-pay

## 2022-10-16 DIAGNOSIS — K449 Diaphragmatic hernia without obstruction or gangrene: Secondary | ICD-10-CM | POA: Diagnosis not present

## 2022-10-16 DIAGNOSIS — K6289 Other specified diseases of anus and rectum: Secondary | ICD-10-CM | POA: Insufficient documentation

## 2022-10-16 DIAGNOSIS — K219 Gastro-esophageal reflux disease without esophagitis: Secondary | ICD-10-CM | POA: Insufficient documentation

## 2022-10-16 DIAGNOSIS — I10 Essential (primary) hypertension: Secondary | ICD-10-CM | POA: Insufficient documentation

## 2022-10-16 DIAGNOSIS — Z1211 Encounter for screening for malignant neoplasm of colon: Secondary | ICD-10-CM | POA: Insufficient documentation

## 2022-10-16 DIAGNOSIS — Z79899 Other long term (current) drug therapy: Secondary | ICD-10-CM | POA: Insufficient documentation

## 2022-10-16 DIAGNOSIS — R1310 Dysphagia, unspecified: Secondary | ICD-10-CM | POA: Insufficient documentation

## 2022-10-16 DIAGNOSIS — R09A2 Foreign body sensation, throat: Secondary | ICD-10-CM

## 2022-10-16 DIAGNOSIS — D122 Benign neoplasm of ascending colon: Secondary | ICD-10-CM | POA: Diagnosis not present

## 2022-10-16 HISTORY — PX: ESOPHAGOGASTRODUODENOSCOPY (EGD) WITH PROPOFOL: SHX5813

## 2022-10-16 HISTORY — PX: COLONOSCOPY WITH PROPOFOL: SHX5780

## 2022-10-16 HISTORY — PX: POLYPECTOMY: SHX5525

## 2022-10-16 HISTORY — PX: SAVORY DILATION: SHX5439

## 2022-10-16 LAB — HM COLONOSCOPY

## 2022-10-16 SURGERY — COLONOSCOPY WITH PROPOFOL
Anesthesia: General

## 2022-10-16 MED ORDER — LACTATED RINGERS IV SOLN: INTRAVENOUS | Status: DC

## 2022-10-16 MED ORDER — LIDOCAINE HCL (CARDIAC) PF 100 MG/5ML IV SOSY
PREFILLED_SYRINGE | INTRAVENOUS | Status: DC | PRN
  Administered 2022-10-16: 50 mg via INTRAVENOUS

## 2022-10-16 MED ORDER — PROPOFOL 500 MG/50ML IV EMUL
INTRAVENOUS | Status: DC | PRN
  Administered 2022-10-16 (×2): 150 ug/kg/min via INTRAVENOUS

## 2022-10-16 MED ORDER — PROPOFOL 10 MG/ML IV BOLUS
INTRAVENOUS | Status: DC | PRN
  Administered 2022-10-16: 100 mg via INTRAVENOUS
  Administered 2022-10-16 (×2): 40 mg via INTRAVENOUS
  Administered 2022-10-16: 20 mg via INTRAVENOUS

## 2022-10-16 NOTE — Op Note (Signed)
[facility] Patient Name: Kaitlin [name] Procedure Date: 10/16/2022 10:38 AM MRN: [id number] Date of Birth: 1971/05/09 Attending MD: Katrinka Blazing , , [id number] CSN: [id number] Age: 52 Admit Type: Outpatient Procedure:                Upper GI endoscopy Indications:              Dysphagia Providers:                Katrinka Blazing, Sheran Fava, Crystal Page,                            Kristine L. Jessee Avers, Technician, Dyann Ruddle Referring MD:             Katrinka Blazing Medicines:                Monitored Anesthesia Care Complications:            No immediate complications. Estimated Blood Loss:     Estimated blood loss: none. Procedure:                Pre-Anesthesia Assessment:                           - Prior to the procedure, a History and Physical                            was performed, and patient medications, allergies                            and sensitivities were reviewed. The patient's                            tolerance of previous anesthesia was reviewed.                           - The risks and benefits of the procedure and the                            sedation options and risks were discussed with the                            patient. All questions were answered and informed                            consent was obtained.                           - ASA Grade Assessment: II - A patient with mild                            systemic disease.                           After obtaining informed consent, the endoscope was                            passed under direct vision. Throughout  the                            procedure, the patient's blood pressure, pulse, and                            oxygen saturations were monitored continuously. The                            [id number]-[id number] ([id number]) scope was introduced through the                            mouth, and advanced to the second part of duodenum.                            The upper GI  endoscopy was accomplished without                            difficulty. The patient tolerated the procedure                            well. Scope In: 10:52:29 AM Scope Out: 11:01:12 AM Total Procedure Duration: 0 hours 8 minutes 43 seconds  Findings:      No endoscopic abnormality was evident in the esophagus to explain the       patient's complaint of dysphagia. It was decided, however, to proceed       with dilation of the entire esophagus. A guidewire was placed and the       scope was withdrawn. Dilation was performed with a Savary dilator with       no resistance at 18 mm. The dilation site was examined following       endoscope reinsertion and showed no change.      A 1 cm hiatal hernia was present.      The stomach was normal.      The examined duodenum was normal. Impression:               - No endoscopic esophageal abnormality to explain                            patient's dysphagia. Esophagus dilated. Dilated.                           - 1 cm hiatal hernia.                           - Normal stomach.                           - Normal examined duodenum.                           - No specimens collected. Moderate Sedation:      Per Anesthesia Care Recommendation:           - Discharge patient to home (ambulatory).                           -  Resume previous diet.                           - Continue present medications. Procedure Code(s):        --- Professional ---                           [phone], Esophagogastroduodenoscopy, flexible,                            transoral; with insertion of guide wire followed by                            passage of dilator(s) through esophagus over guide                            wire Diagnosis Code(s):        --- Professional ---                           R13.10, Dysphagia, unspecified                           K44.9, Diaphragmatic hernia without obstruction or                            gangrene CPT copyright 2022 American  Medical Association. All rights reserved. The codes documented in this report are preliminary and upon coder review may  be revised to meet current compliance requirements. Katrinka Blazing, MD Katrinka Blazing,  10/16/2022 11:05:38 AM This report has been signed electronically. Number of Addenda: 0

## 2022-10-16 NOTE — H&P (Signed)
Kaitlin [name] is an 52 y.o. female.   Chief Complaint: dysphagia and CRC screening. HPI: 52 y/o F with PMH GERD, Hemorrhoids, IBS, IDA, HTN, rosacea, who came to the hospital for evaluation of dysphagia and colorectal cancer screening.  Reports having some throat discomfort and dysphagia occasionally.  Has felt better after starting PPI on a daily basis.  The patient denies having any nausea, vomiting, fever, chills, hematochezia, melena, hematemesis, abdominal distention, abdominal pain, diarrhea, jaundice, pruritus or weight loss.   Past Medical History:  Diagnosis Date   DDD (degenerative disc disease), lumbosacral    Family history of adverse reaction to anesthesia    father and brother--- ponv   GERD (gastroesophageal reflux disease)    Hemorrhoids    History of kidney stones    History of low potassium    per pt 11/ 2014 ED visit  with right arm pain, ekg normal , but had to have IV potassium, no issues since   Hypertension    followed by pcp   (09-13-2019  per pt never had stress test)   Iron deficiency anemia secondary to blood loss (chronic)    followed by Dr Ellin Saba (hematology)    d/t uterine blood loss;  treated with intermittant iron infusions   Irritable bowel syndrome with constipation    Microcytic anemia    PMB (postmenopausal bleeding)    PONV (postoperative nausea and vomiting)    Rosacea    Sciatica, right side     Past Surgical History:  Procedure Laterality Date   ANTERIOR CERVICAL DECOMP/DISCECTOMY FUSION N/A 06/12/2013   Procedure: ANTERIOR CERVICAL DECOMPRESSION/DISCECTOMY FUSION 1 LEVEL;  Surgeon: Barnett Abu, MD;  Location: [facility];  Service: Neurosurgery;  Laterality: N/A;  C5-6 Anterior cervical decompression/diskectomy/fusion   APPENDECTOMY  06/23/2011   BREAST BIOPSY Right 2014   per pt benign   COLONOSCOPY W/ POLYPECTOMY  2012   DIAGNOSTIC LAPAROSCOPY  2002   DILATATION & CURETTAGE/HYSTEROSCOPY WITH MYOSURE N/A 02/09/2022    Procedure: DILATATION & CURETTAGE/HYSTEROSCOPY WITH MYOSURE;  Surgeon: Olivia Mackie, MD;  Location: [facility];  Service: Gynecology;  Laterality: N/A;   DILITATION & CURRETTAGE/HYSTROSCOPY WITH NOVASURE ABLATION N/A 09/19/2019   Procedure: DILATATION & CURETTAGE/HYSTEROSCOPY;  Surgeon: Olivia Mackie, MD;  Location: [facility];  Service: Gynecology;  Laterality: N/A;   FLEXIBLE SIGMOIDOSCOPY N/A 04/27/2018   Procedure: FLEXIBLE SIGMOIDOSCOPY;  Surgeon: Malissa Hippo, MD;  Location: [facility];  Service: Endoscopy;  Laterality: N/A;  8:30   FOOT SURGERY Right 2005   Bunionectomy   ROBOTIC ASSISTED LAPAROSCOPIC LYSIS OF ADHESION N/A 09/19/2019   Procedure: XI ROBOTIC ASSISTED LAPAROSCOPIC LYSIS OF ADHESION/Ablation of Endometriosis;  Surgeon: Olivia Mackie, MD;  Location: [facility];  Service: Gynecology;  Laterality: N/A;    Family History  Problem Relation Age of Onset   Prostate cancer Father    Hypertension Brother    Social History:  reports that she has never smoked. She has never used smokeless tobacco. She reports that she does not drink alcohol and does not use drugs.  Allergies:  Allergies  Allergen Reactions   Erythromycin Itching   Hydrocodone Itching   Other Hives, Itching, Swelling and Rash    Surgical glue---- Dermabond     No medications prior to admission.    No results found for this or any previous visit (from the past 48 hour(s)). No results found.  Review of Systems  All other systems reviewed and are negative.  Blood pressure 122/70, pulse 80, temperature (!) 97.4 F (36.3 C), temperature source Oral, resp. rate 18, last menstrual period 03/10/2021, SpO2 97 %. Physical Exam  GENERAL: The patient is AO x3, in no acute distress. HEENT: Head is normocephalic and atraumatic. EOMI are intact. Mouth is well hydrated and without lesions. NECK: Supple. No masses LUNGS: Clear to auscultation. No  presence of rhonchi/wheezing/rales. Adequate chest expansion HEART: RRR, normal s1 and s2. ABDOMEN: Soft, nontender, no guarding, no peritoneal signs, and nondistended. BS +. No masses. EXTREMITIES: Without any cyanosis, clubbing, rash, lesions or edema. NEUROLOGIC: AOx3, no focal motor deficit. SKIN: no jaundice, no rashes  Assessment/Plan 52 y/o F with PMH GERD, Hemorrhoids, IBS, IDA, HTN, rosacea, who came to the hospital for evaluation of dysphagia and colorectal cancer screening. Proceed with EGD and colonoscopy.  Dolores Frame, MD 10/16/2022, 1:44 PM

## 2022-10-16 NOTE — Transfer of Care (Signed)
Immediate Anesthesia Transfer of Care Note  Patient: Kaitlin [name]  Procedure(s) Performed: COLONOSCOPY WITH PROPOFOL ESOPHAGOGASTRODUODENOSCOPY (EGD) WITH PROPOFOL SAVORY DILATION POLYPECTOMY  Patient Location: Short Stay  Anesthesia Type:General  Level of Consciousness: drowsy and patient cooperative  Airway & Oxygen Therapy: Patient Spontanous Breathing  Post-op Assessment: Report given to RN and Post -op Vital signs reviewed and stable  Post vital signs: Reviewed and stable  Last Vitals:  Vitals Value Taken Time  BP 122/70 1145  Temp 97.4 1145  Pulse 80 1145  Resp 18 1145  SpO2 97 1145    Last Pain:  Vitals:   10/16/22 1047  TempSrc:   PainSc: 0-No pain      Patients Stated Pain Goal: 6 (10/16/22 1001)  Complications: No notable events documented.

## 2022-10-16 NOTE — Op Note (Signed)
[facility] Patient Name: Kaitlin [name] Procedure Date: 10/16/2022 10:37 AM MRN: [id number] Date of Birth: 07/23/1970 Attending MD: Katrinka Blazing , , [id number] CSN: [id number] Age: 52 Admit Type: Outpatient Procedure:                Colonoscopy Indications:              Screening for colorectal malignant neoplasm Providers:                Katrinka Blazing, Sheran Fava, Crystal Page,                            Kristine L. Jessee Avers, Technician, Dyann Ruddle Referring MD:             Katrinka Blazing Medicines:                Monitored Anesthesia Care Complications:            No immediate complications. Estimated Blood Loss:     Estimated blood loss: none. Procedure:                Pre-Anesthesia Assessment:                           - Prior to the procedure, a History and Physical                            was performed, and patient medications, allergies                            and sensitivities were reviewed. The patient's                            tolerance of previous anesthesia was reviewed.                           - The risks and benefits of the procedure and the                            sedation options and risks were discussed with the                            patient. All questions were answered and informed                            consent was obtained.                           - ASA Grade Assessment: II - A patient with mild                            systemic disease.                           After obtaining informed consent, the colonoscope                            was passed under direct  vision. Throughout the                            procedure, the patient's blood pressure, pulse, and                            oxygen saturations were monitored continuously. The                            [id number]-HQ190L ([id number]) scope was introduced through                            the anus and advanced to the the cecum, identified                             by appendiceal orifice and ileocecal valve. The                            colonoscopy was performed without difficulty. The                            patient tolerated the procedure well. The quality                            of the bowel preparation was good. Scope In: 11:07:28 AM Scope Out: 11:36:58 AM Scope Withdrawal Time: 0 hours 21 minutes 30 seconds  Total Procedure Duration: 0 hours 29 minutes 30 seconds  Findings:      The perianal and digital rectal examinations were normal.      A 1 mm polyp was found in the ascending colon. The polyp was sessile.       The polyp was removed with a cold biopsy forceps. Resection and       retrieval were complete.      Anal papilla(e) were hypertrophied, seen in retroflexion. Impression:               - One 1 mm polyp in the ascending colon, removed                            with a cold biopsy forceps. Resected and retrieved.                           - Anal papilla(e) were hypertrophied. Moderate Sedation:      Per Anesthesia Care Recommendation:           - Discharge patient to home (ambulatory).                           - Resume previous diet.                           - Await pathology results.                           - Repeat colonoscopy date to be determined after  pending pathology results are reviewed for                            screening purposes. Procedure Code(s):        --- Professional ---                           [phone], Colonoscopy, flexible; with biopsy, single                            or multiple Diagnosis Code(s):        --- Professional ---                           Z12.11, Encounter for screening for malignant                            neoplasm of colon                           D12.2, Benign neoplasm of ascending colon                           K62.89, Other specified diseases of anus and rectum CPT copyright 2022 [employer]. All rights reserved. The  codes documented in this report are preliminary and upon coder review may  be revised to meet current compliance requirements. Katrinka Blazing, MD Katrinka Blazing,  10/16/2022 11:47:35 AM This report has been signed electronically. Number of Addenda: 0

## 2022-10-16 NOTE — Anesthesia Procedure Notes (Signed)
Date/Time: 10/16/2022 10:46 AM  Performed by: Franco Nones, CRNAPre-anesthesia Checklist: Patient identified, Emergency Drugs available, Suction available, Timeout performed and Patient being monitored Patient Re-evaluated:Patient Re-evaluated prior to induction Oxygen Delivery Method: Nasal Cannula

## 2022-10-16 NOTE — Anesthesia Postprocedure Evaluation (Signed)
Anesthesia Post Note  Patient: Kaitlin [name]  Procedure(s) Performed: COLONOSCOPY WITH PROPOFOL ESOPHAGOGASTRODUODENOSCOPY (EGD) WITH PROPOFOL SAVORY DILATION POLYPECTOMY  Patient location during evaluation: Phase II Anesthesia Type: General Level of consciousness: awake and alert and oriented Pain management: pain level controlled Vital Signs Assessment: post-procedure vital signs reviewed and stable Respiratory status: spontaneous breathing, nonlabored ventilation and respiratory function stable Cardiovascular status: blood pressure returned to baseline and stable Postop Assessment: no apparent nausea or vomiting Anesthetic complications: no  No notable events documented.   Last Vitals:  Vitals:   10/16/22 1001 10/16/22 1142  BP: (!) 141/84 122/70  Pulse: 72 80  Resp: 16 18  Temp: 37 C (!) 36.3 C  SpO2: 100% 97%    Last Pain:  Vitals:   10/16/22 1142  TempSrc: Oral  PainSc: 0-No pain                 Saumya Hukill C Mycheal [place]

## 2022-10-16 NOTE — Discharge Instructions (Addendum)
You are being discharged to home.  Resume your previous diet.  Continue your present medications.  We are waiting for your pathology results.  Your physician has recommended a repeat colonoscopy (date to be determined after pending pathology results are reviewed) for screening purposes.  

## 2022-10-16 NOTE — Anesthesia Preprocedure Evaluation (Signed)
Anesthesia Evaluation  Patient identified by MRN, date of birth, ID band Patient awake    Reviewed: Allergy & Precautions, H&P , NPO status , Patient's Chart, lab work & pertinent test results  History of Anesthesia Complications (+) PONV, Family history of anesthesia reaction and history of anesthetic complications  Airway Mallampati: II  TM Distance: >3 FB Neck ROM: Full    Dental  (+) Dental Advisory Given, Teeth Intact   Pulmonary neg pulmonary ROS   Pulmonary exam normal breath sounds clear to auscultation       Cardiovascular Exercise Tolerance: Good hypertension, Pt. on medications Normal cardiovascular exam Rhythm:Regular Rate:Normal     Neuro/Psych  Neuromuscular disease  negative psych ROS   GI/Hepatic Neg liver ROS,GERD  Medicated and Controlled,,  Endo/Other  negative endocrine ROS    Renal/GU negative Renal ROS  negative genitourinary   Musculoskeletal  (+) Arthritis , Osteoarthritis,    Abdominal   Peds negative pediatric ROS (+)  Hematology  (+) Blood dyscrasia, anemia   Anesthesia Other Findings   Reproductive/Obstetrics negative OB ROS                             Anesthesia Physical Anesthesia Plan  ASA: 2  Anesthesia Plan: General   Post-op Pain Management: Minimal or no pain anticipated   Induction: Intravenous  PONV Risk Score and Plan: Treatment may vary due to age or medical condition  Airway Management Planned: Nasal Cannula and Natural Airway  Additional Equipment:   Intra-op Plan:   Post-operative Plan:   Informed Consent: I have reviewed the patients History and Physical, chart, labs and discussed the procedure including the risks, benefits and alternatives for the proposed anesthesia with the patient or authorized representative who has indicated his/her understanding and acceptance.     Dental advisory given  Plan Discussed with: CRNA and  Surgeon  Anesthesia Plan Comments:        Anesthesia Quick Evaluation

## 2022-10-19 LAB — SURGICAL PATHOLOGY

## 2022-10-20 ENCOUNTER — Encounter (INDEPENDENT_AMBULATORY_CARE_PROVIDER_SITE_OTHER): Payer: Self-pay | Admitting: *Deleted

## 2022-10-22 ENCOUNTER — Encounter (HOSPITAL_COMMUNITY): Payer: Self-pay | Admitting: Gastroenterology

## 2022-11-11 ENCOUNTER — Other Ambulatory Visit: Payer: Self-pay | Admitting: *Deleted

## 2022-11-11 DIAGNOSIS — I10 Essential (primary) hypertension: Secondary | ICD-10-CM

## 2022-11-11 DIAGNOSIS — E669 Obesity, unspecified: Secondary | ICD-10-CM

## 2022-11-19 ENCOUNTER — Other Ambulatory Visit: Payer: BC Managed Care – PPO

## 2022-11-19 ENCOUNTER — Inpatient Hospital Stay: Payer: BC Managed Care – PPO | Attending: Physician Assistant

## 2022-11-19 DIAGNOSIS — E559 Vitamin D deficiency, unspecified: Secondary | ICD-10-CM

## 2022-11-19 DIAGNOSIS — I10 Essential (primary) hypertension: Secondary | ICD-10-CM

## 2022-11-19 DIAGNOSIS — D500 Iron deficiency anemia secondary to blood loss (chronic): Secondary | ICD-10-CM | POA: Insufficient documentation

## 2022-11-19 DIAGNOSIS — D509 Iron deficiency anemia, unspecified: Secondary | ICD-10-CM

## 2022-11-19 DIAGNOSIS — N920 Excessive and frequent menstruation with regular cycle: Secondary | ICD-10-CM | POA: Insufficient documentation

## 2022-11-19 DIAGNOSIS — E669 Obesity, unspecified: Secondary | ICD-10-CM

## 2022-11-19 LAB — CBC WITH DIFFERENTIAL/PLATELET
Abs Immature Granulocytes: 0.02 10*3/uL (ref 0.00–0.07)
Basophils Absolute: 0 10*3/uL (ref 0.0–0.1)
Basophils Relative: 1 %
Eosinophils Absolute: 0.1 10*3/uL (ref 0.0–0.5)
Eosinophils Relative: 1 %
HCT: 39.9 % (ref 36.0–46.0)
Hemoglobin: 13.5 g/dL (ref 12.0–15.0)
Immature Granulocytes: 0 %
Lymphocytes Relative: 43 %
Lymphs Abs: 3 10*3/uL (ref 0.7–4.0)
MCH: 31 pg (ref 26.0–34.0)
MCHC: 33.8 g/dL (ref 30.0–36.0)
MCV: 91.7 fL (ref 80.0–100.0)
Monocytes Absolute: 0.4 10*3/uL (ref 0.1–1.0)
Monocytes Relative: 5 %
Neutro Abs: 3.4 10*3/uL (ref 1.7–7.7)
Neutrophils Relative %: 50 %
Platelets: 299 10*3/uL (ref 150–400)
RBC: 4.35 MIL/uL (ref 3.87–5.11)
RDW: 12.1 % (ref 11.5–15.5)
WBC: 6.8 10*3/uL (ref 4.0–10.5)
nRBC: 0 % (ref 0.0–0.2)

## 2022-11-19 LAB — COMPREHENSIVE METABOLIC PANEL
ALT: 15 U/L (ref 0–44)
AST: 15 U/L (ref 15–41)
Albumin: 4.4 g/dL (ref 3.5–5.0)
Alkaline Phosphatase: 70 U/L (ref 38–126)
Anion gap: 10 (ref 5–15)
BUN: 16 mg/dL (ref 6–20)
CO2: 28 mmol/L (ref 22–32)
Calcium: 8.5 mg/dL — ABNORMAL LOW (ref 8.9–10.3)
Chloride: 101 mmol/L (ref 98–111)
Creatinine, Ser: 0.8 mg/dL (ref 0.44–1.00)
GFR, Estimated: >60 mL/min (ref >60)
Glucose, Bld: 99 mg/dL (ref 70–99)
Potassium: 3.6 mmol/L (ref 3.5–5.1)
Sodium: 139 mmol/L (ref 135–145)
Total Bilirubin: 0.7 mg/dL (ref 0.3–1.2)
Total Protein: 7.4 g/dL (ref 6.5–8.1)

## 2022-11-19 LAB — FERRITIN: Ferritin: 139 ng/mL (ref 11–307)

## 2022-11-19 LAB — IRON AND TIBC
Iron: 65 ug/dL (ref 28–170)
Saturation Ratios: 19 % (ref 10.4–31.8)
TIBC: 341 ug/dL (ref 250–450)
UIBC: 276 ug/dL

## 2022-11-19 LAB — VITAMIN D 25 HYDROXY (VIT D DEFICIENCY, FRACTURES): Vit D, 25-Hydroxy: 21.58 ng/mL — ABNORMAL LOW (ref 30–100)

## 2022-11-19 LAB — LIPID PANEL
Cholesterol: 244 mg/dL — ABNORMAL HIGH (ref 0–200)
HDL: 58 mg/dL (ref >40)
LDL Cholesterol: 169 mg/dL — ABNORMAL HIGH (ref 0–99)
Total CHOL/HDL Ratio: 4.2 RATIO
Triglycerides: 85 mg/dL (ref <150)
VLDL: 17 mg/dL (ref 0–40)

## 2022-11-19 LAB — TSH: TSH: 2.341 u[IU]/mL (ref 0.350–4.500)

## 2022-11-20 LAB — HEMOGLOBIN A1C
Hgb A1c MFr Bld: 5.5 % (ref 4.8–5.6)
Mean Plasma Glucose: 111 mg/dL

## 2022-11-25 NOTE — Progress Notes (Signed)
[facility] [address], [place] [address]    Clinic Day:  11/26/2022  Referring physician: [facility], [facility]  Patient Care Team: Juliette Alcide, MD as PCP - General (Family Medicine)   ASSESSMENT & PLAN:   Assessment: 1.  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 - Hemoccult stool negative x 3 in August 2020 - Cannot tolerate iron pills because of severe constipation.  Plan: 1.  Iron deficiency state:  - Last Venofer 400 mg on 06/12/2022. - She is currently on Bijuva and did not have any vaginal bleeding since August 2023. - She has slight tiredness but not severe.  No ice pica. - Reviewed labs from 11/19/2022: Hemoglobin-13.5, ferritin-139. - No indication for parenteral iron therapy at this time.  She will call us if she feels weak.  Otherwise she will come back in 6 months for follow-up.   2.  Vitamin D deficiency - She was taking vitamin D 50,000 units every other week.  Vitamin D level is low at 21. - Will increase vitamin D to 50,000 units weekly.  Recheck in 6 months.     Orders Placed This Encounter  Procedures   CBC    Standing Status:   Future    Standing Expiration Date:   11/26/2023   Ferritin    Standing Status:   Future    Standing Expiration Date:   11/26/2023    Order Specific Question:   Release to patient    Answer:   Immediate   Iron and TIBC    Standing Status:   Future    Standing Expiration Date:   11/26/2023    Order Specific Question:   Release to patient    Answer:   Immediate   VITAMIN D 25 Hydroxy (Vit-D Deficiency, Fractures)    Standing Status:   Future    Standing Expiration Date:   11/26/2023    Order Specific Question:   Release to patient    Answer:   Immediate      I,Katie Daubenspeck,acting as a scribe for Doreatha Massed,  MD.,have documented all relevant documentation on the behalf of Doreatha Massed, MD,as directed by  Doreatha Massed, MD while in the presence of Doreatha Massed, MD.   I, Doreatha Massed MD, have reviewed the above documentation for accuracy and completeness, and I agree with the above.   Doreatha Massed, MD   5/30/20244:42 PM  CHIEF COMPLAINT:   Diagnosis: iron deficiency anemia    Cancer Staging  No matching staging information was found for the patient.   Prior Therapy: none  Current Therapy:  Intermittent IV iron    HISTORY OF PRESENT ILLNESS:   Oncology History   No history exists.     INTERVAL HISTORY:   Kaitlin [name] is a 52 y.o. female presenting to clinic today for follow up of iron deficiency anemia. She was last seen by PA Rebekah on 05/25/22.  Today, she states that she is doing well overall. Her appetite level is at 100%. Her energy level is at 75%.  PAST MEDICAL HISTORY:   Past Medical History: Past Medical History:  Diagnosis Date   DDD (degenerative disc disease), lumbosacral    Family history of adverse reaction to anesthesia    father and brother--- ponv   GERD (gastroesophageal reflux disease)    Hemorrhoids    History  of kidney stones    History of low potassium    per pt 11/ 2014 ED visit @[facility]  with right arm pain, ekg normal , but had to have IV potassium, no issues since   Hypertension    followed by pcp   (09-13-2019  per pt never had stress test)   Iron deficiency anemia secondary to blood loss (chronic)    followed by Dr Ellin Saba (hematology)    d/t uterine blood loss;  treated with intermittant iron infusions   Irritable bowel syndrome with constipation    Microcytic anemia    PMB (postmenopausal bleeding)    PONV (postoperative nausea and vomiting)    Rosacea    Sciatica, right side     Surgical History: Past Surgical History:  Procedure Laterality Date   ANTERIOR CERVICAL DECOMP/DISCECTOMY FUSION N/A  06/12/2013   Procedure: ANTERIOR CERVICAL DECOMPRESSION/DISCECTOMY FUSION 1 LEVEL;  Surgeon: Barnett Abu, MD;  Location: [facility];  Service: Neurosurgery;  Laterality: N/A;  C5-6 Anterior cervical decompression/diskectomy/fusion   APPENDECTOMY  06/23/2011   BREAST BIOPSY Right 2014   per pt benign   COLONOSCOPY W/ POLYPECTOMY  2012   COLONOSCOPY WITH PROPOFOL N/A 10/16/2022   Procedure: COLONOSCOPY WITH PROPOFOL;  Surgeon: Dolores Frame, MD;  Location: [facility];  Service: Gastroenterology;  Laterality: N/A;  11:30am; ASA 2   DIAGNOSTIC LAPAROSCOPY  2002   DILATATION & CURETTAGE/HYSTEROSCOPY WITH MYOSURE N/A 02/09/2022   Procedure: DILATATION & CURETTAGE/HYSTEROSCOPY WITH MYOSURE;  Surgeon: Olivia Mackie, MD;  Location: [facility];  Service: Gynecology;  Laterality: N/A;   DILITATION & CURRETTAGE/HYSTROSCOPY WITH NOVASURE ABLATION N/A 09/19/2019   Procedure: DILATATION & CURETTAGE/HYSTEROSCOPY;  Surgeon: Olivia Mackie, MD;  Location: [facility];  Service: Gynecology;  Laterality: N/A;   ESOPHAGOGASTRODUODENOSCOPY (EGD) WITH PROPOFOL N/A 10/16/2022   Procedure: ESOPHAGOGASTRODUODENOSCOPY (EGD) WITH PROPOFOL;  Surgeon: Dolores Frame, MD;  [facility]: [facility];  Service: Gastroenterology;  Laterality: N/A;  11:30am:ASA 2   FLEXIBLE SIGMOIDOSCOPY N/A 04/27/2018   Procedure: FLEXIBLE SIGMOIDOSCOPY;  Surgeon: Malissa Hippo, MD;  [facility]: [facility];  Service: Endoscopy;  Laterality: N/A;  8:30   FOOT SURGERY Right 2005   Bunionectomy   POLYPECTOMY  10/16/2022   Procedure: POLYPECTOMY;  Surgeon: Marguerita Merles, Reuel Boom, MD;  Location: [facility];  Service: Gastroenterology;;   ROBOTIC ASSISTED LAPAROSCOPIC LYSIS OF ADHESION N/A 09/19/2019   Procedure: XI ROBOTIC ASSISTED LAPAROSCOPIC LYSIS OF ADHESION/Ablation of Endometriosis;  Surgeon: Olivia Mackie, MD;  Location: [facility];  Service: Gynecology;   Laterality: N/A;   SAVORY DILATION  10/16/2022   Procedure: SAVORY DILATION;  Surgeon: Marguerita Merles, Reuel Boom, MD;  [facility]: [facility];  Service: Gastroenterology;;    Social History: Social History   Socioeconomic History   Marital status: Married    Spouse name: Not on file   Number of children: Not on file   Years of education: Not on file   Highest education level: Not on file  Occupational History   Not on file  Tobacco Use   Smoking status: Never   Smokeless tobacco: Never  Vaping Use   Vaping Use: Never used  Substance and Sexual Activity   Alcohol use: No   Drug use: Never   Sexual activity: Not on file  Other Topics Concern   Not on file  Social History Narrative   Not on file   Social Determinants of Health   Financial Resource Strain: Not on file  Food  Insecurity: Not on file  Transportation Needs: Not on file  Physical Activity: Not on file  Stress: Not on file  Social Connections: Not on file  Intimate Partner Violence: Not on file    Family History: Family History  Problem Relation Age of Onset   Prostate cancer Father    Hypertension Brother     Current Medications:  Current Outpatient Medications:    cetirizine (ZYRTEC) 10 MG tablet, Take 10 mg by mouth every evening., Disp: , Rfl:    CRANBERRY PO, Take 1,000 mg by mouth at bedtime as needed (UTI Symptoms)., Disp: , Rfl:    cyclobenzaprine (FLEXERIL) 10 MG tablet, Take 10 mg by mouth 3 (three) times daily as needed for muscle spasms., Disp: , Rfl:    Estradiol-Progesterone (BIJUVA) 1-100 MG CAPS, Take 1 capsule by mouth at bedtime., Disp: , Rfl:    fluticasone (FLONASE) 50 MCG/ACT nasal spray, Place 2 sprays into both nostrils daily. , Disp: , Rfl:    furosemide (LASIX) 40 MG tablet, Take 40 mg by mouth daily. , Disp: , Rfl:    gabapentin (NEURONTIN) 300 MG capsule, Take 300 mg by mouth at bedtime., Disp: , Rfl:    ibuprofen (ADVIL) 200 MG tablet, Take 800 mg by mouth at bedtime., Disp:  , Rfl:    lubiprostone (AMITIZA) 8 MCG capsule, TAKE ONE CAPSULE BY MOUTH TWICE DAILY WITH A MEAL, Disp: 180 capsule, Rfl: 0   pantoprazole (PROTONIX) 40 MG tablet, Take 1 tablet (40 mg total) by mouth daily., Disp: 60 tablet, Rfl: 3   potassium chloride (KLOR-CON M) 10 MEQ tablet, Take 20 mEq by mouth daily., Disp: , Rfl:    Vitamin D, Ergocalciferol, (DRISDOL) 1.25 MG (50000 UNIT) CAPS capsule, Take 1 capsule (50,000 Units total) by mouth every 7 (seven) days., Disp: 12 capsule, Rfl: 1   Allergies: Allergies  Allergen Reactions   Erythromycin Itching   Hydrocodone Itching   Other Hives, Itching, Swelling and Rash    Surgical glue---- Dermabond     REVIEW OF SYSTEMS:   Review of Systems  Constitutional:  Negative for chills, fatigue and fever.  HENT:   Negative for lump/mass, mouth sores, nosebleeds, sore throat and trouble swallowing.   Eyes:  Negative for eye problems.  Respiratory:  Positive for cough. Negative for shortness of breath.   Cardiovascular:  Negative for chest pain, leg swelling and palpitations.  Gastrointestinal:  Negative for abdominal pain, constipation, diarrhea, nausea and vomiting.  Genitourinary:  Negative for bladder incontinence, difficulty urinating, dysuria, frequency, hematuria and nocturia.   Musculoskeletal:  Positive for arthralgias. Negative for back pain, flank pain, myalgias and neck pain.  Skin:  Negative for itching and rash.  Neurological:  Positive for numbness. Negative for dizziness and headaches.  Hematological:  Does not bruise/bleed easily.  Psychiatric/Behavioral:  Negative for depression, sleep disturbance and suicidal ideas. The patient is not nervous/anxious.   All other systems reviewed and are negative.    VITALS:   Blood pressure 134/84, pulse 89, temperature 98.9 F (37.2 C), temperature source Oral, resp. rate 17, weight 213 lb 12.8 oz (97 kg), last menstrual period 03/10/2021, SpO2 100 %.  Wt Readings from Last 3 Encounters:   11/26/22 213 lb 12.8 oz (97 kg)  10/13/22 212 lb (96.2 kg)  09/14/22 209 lb 3.2 oz (94.9 kg)    Body mass index is 34.51 kg/m.  Performance status (ECOG): 0 - Asymptomatic  PHYSICAL EXAM:   Physical Exam Vitals and nursing note reviewed. Exam conducted  with a chaperone present.  Constitutional:      Appearance: Normal appearance.  Cardiovascular:     Rate and Rhythm: Normal rate and regular rhythm.     Pulses: Normal pulses.     Heart sounds: Normal heart sounds.  Pulmonary:     Effort: Pulmonary effort is normal.     Breath sounds: Normal breath sounds.  Abdominal:     Palpations: Abdomen is soft. There is no hepatomegaly, splenomegaly or mass.     Tenderness: There is no abdominal tenderness.  Musculoskeletal:     Right lower leg: No edema.     Left lower leg: No edema.  Lymphadenopathy:     Cervical: No cervical adenopathy.     Right cervical: No superficial, deep or posterior cervical adenopathy.    Left cervical: No superficial, deep or posterior cervical adenopathy.     Upper Body:     Right upper body: No supraclavicular or axillary adenopathy.     Left upper body: No supraclavicular or axillary adenopathy.  Neurological:     General: No focal deficit present.     Mental Status: She is alert and oriented to person, place, and time.  Psychiatric:        Mood and Affect: Mood normal.        Behavior: Behavior normal.     LABS:      Latest Ref Rng & Units 11/19/2022    3:16 PM 04/09/2022    2:57 PM 02/09/2022    7:37 AM  CBC  WBC 4.0 - 10.5 K/uL 6.8  5.7    Hemoglobin 12.0 - 15.0 g/dL 96.0  45.4  09.8   Hematocrit 36.0 - 46.0 % 39.9  38.1  38.0   Platelets 150 - 400 K/uL 299  292        Latest Ref Rng & Units 11/19/2022    3:16 PM 02/09/2022    7:37 AM 10/01/2021    1:48 PM  CMP  Glucose 70 - 99 mg/dL 99  119  93   BUN 6 - 20 mg/dL 16  11  13    Creatinine 0.44 - 1.00 mg/dL 1.47  8.29  5.62   Sodium 135 - 145 mmol/L 139  141  140   Potassium 3.5 - 5.1  mmol/L 3.6  3.6  3.8   Chloride 98 - 111 mmol/L 101  102  105   CO2 22 - 32 mmol/L 28   28   Calcium 8.9 - 10.3 mg/dL 8.5   8.8   Total Protein 6.5 - 8.1 g/dL 7.4   7.4   Total Bilirubin 0.3 - 1.2 mg/dL 0.7   0.7   Alkaline Phos 38 - 126 U/L 70   66   AST 15 - 41 U/L 15   13   ALT 0 - 44 U/L 15   16      No results found for: "CEA1", "CEA" / No results found for: "CEA1", "CEA" No results found for: "PSA1" No results found for: "CAN199" No results found for: "CAN125"  Lab Results  Component Value Date   TOTALPROTELP 7.1 02/13/2019   ALBUMINELP 4.0 02/13/2019   A1GS 0.3 02/13/2019   A2GS 0.8 02/13/2019   BETS 1.0 02/13/2019   GAMS 1.1 02/13/2019   MSPIKE Not Observed 02/13/2019   SPEI Comment 02/13/2019   Lab Results  Component Value Date   TIBC 341 11/19/2022   TIBC 325 04/09/2022   TIBC 329 10/01/2021   FERRITIN 139 11/19/2022  FERRITIN 74 04/09/2022   FERRITIN 96 10/01/2021   IRONPCTSAT 19 11/19/2022   IRONPCTSAT 20 04/09/2022   IRONPCTSAT 26 10/01/2021   Lab Results  Component Value Date   LDH 142 10/01/2021   LDH 148 03/27/2021   LDH 159 09/17/2020     STUDIES:   No results found.

## 2022-11-26 ENCOUNTER — Inpatient Hospital Stay (HOSPITAL_BASED_OUTPATIENT_CLINIC_OR_DEPARTMENT_OTHER): Payer: BC Managed Care – PPO | Admitting: Hematology

## 2022-11-26 ENCOUNTER — Ambulatory Visit: Payer: BC Managed Care – PPO | Admitting: Physician Assistant

## 2022-11-26 VITALS — BP 134/84 | HR 89 | Temp 98.9°F | Resp 17 | Wt 213.8 lb

## 2022-11-26 DIAGNOSIS — D500 Iron deficiency anemia secondary to blood loss (chronic): Secondary | ICD-10-CM | POA: Diagnosis not present

## 2022-11-26 DIAGNOSIS — E559 Vitamin D deficiency, unspecified: Secondary | ICD-10-CM | POA: Diagnosis not present

## 2022-11-26 DIAGNOSIS — N920 Excessive and frequent menstruation with regular cycle: Secondary | ICD-10-CM | POA: Diagnosis not present

## 2022-11-26 DIAGNOSIS — D509 Iron deficiency anemia, unspecified: Secondary | ICD-10-CM | POA: Diagnosis not present

## 2022-11-26 MED ORDER — VITAMIN D (ERGOCALCIFEROL) 1.25 MG (50000 UNIT) PO CAPS
50000.0000 [IU] | ORAL_CAPSULE | ORAL | 1 refills | Status: DC
Start: 2022-11-26 — End: 2022-12-24

## 2022-11-26 NOTE — Patient Instructions (Signed)
[facility]  Discharge Instructions  You were seen and examined today by Dr. Ellin Saba.  Dr. Ellin Saba discussed your most recent lab work which revealed that your Vitamin D is low.  Dr. Ellin Saba sent in Vitamin D 50,000 units once weekly.  Call our office if you have worsening tiredness.  Follow-up as scheduled in 6 months.    Thank you for choosing [facility] to provide your oncology and hematology care.   To afford each patient quality time with our provider, please arrive at least 15 minutes before your scheduled appointment time. You may need to reschedule your appointment if you arrive late (10 or more minutes). Arriving late affects you and other patients whose appointments are after yours.  Also, if you miss three or more appointments without notifying the office, you may be dismissed from the clinic at the provider's discretion.    Again, thank you for choosing [facility].  Our hope is that these requests will decrease the amount of time that you wait before being seen by our physicians.   If you have a lab appointment with the Cancer Center - please note that after April 8th, all labs will be drawn in the cancer center.  You do not have to check in or register with the main entrance as you have in the past but will complete your check-in at the cancer center.            _____________________________________________________________  Should you have questions after your visit to [facility], please contact our office at [phone] and follow the prompts.  Our office hours are 8:00 a.m. to 4:30 p.m. Monday - Thursday and 8:00 a.m. to 2:30 p.m. Friday.  Please note that voicemails left after 4:00 p.m. may not be returned until the following business day.  We are closed weekends and all major holidays.  You do have access to a nurse 24-7, just call the main number to the clinic [phone] and do  not press any options, hold on the line and a nurse will answer the phone.    For prescription refill requests, have your pharmacy contact our office and allow 72 hours.    Masks are no longer required in the cancer centers. If you would like for your care team to wear a mask while they are taking care of you, please let them know. You may have one support person who is at least 51 years old accompany you for your appointments.

## 2022-12-24 ENCOUNTER — Other Ambulatory Visit: Payer: Self-pay | Admitting: *Deleted

## 2022-12-24 DIAGNOSIS — E559 Vitamin D deficiency, unspecified: Secondary | ICD-10-CM

## 2022-12-24 MED ORDER — VITAMIN D (ERGOCALCIFEROL) 1.25 MG (50000 UNIT) PO CAPS
50000.0000 [IU] | ORAL_CAPSULE | ORAL | 3 refills | Status: DC
Start: 2022-12-24 — End: 2023-12-20

## 2023-01-05 ENCOUNTER — Ambulatory Visit (INDEPENDENT_AMBULATORY_CARE_PROVIDER_SITE_OTHER): Payer: BC Managed Care – PPO | Admitting: Gastroenterology

## 2023-02-18 DIAGNOSIS — N939 Abnormal uterine and vaginal bleeding, unspecified: Secondary | ICD-10-CM | POA: Diagnosis not present

## 2023-02-18 DIAGNOSIS — Z01419 Encounter for gynecological examination (general) (routine) without abnormal findings: Secondary | ICD-10-CM | POA: Diagnosis not present

## 2023-03-12 ENCOUNTER — Other Ambulatory Visit (INDEPENDENT_AMBULATORY_CARE_PROVIDER_SITE_OTHER): Payer: Self-pay | Admitting: *Deleted

## 2023-03-12 MED ORDER — IBSRELA 50 MG PO TABS: ORAL_TABLET | ORAL | 1 refills | Status: DC

## 2023-03-29 ENCOUNTER — Encounter (INDEPENDENT_AMBULATORY_CARE_PROVIDER_SITE_OTHER): Payer: Self-pay | Admitting: Gastroenterology

## 2023-03-29 ENCOUNTER — Ambulatory Visit (INDEPENDENT_AMBULATORY_CARE_PROVIDER_SITE_OTHER): Payer: BC Managed Care – PPO | Admitting: Gastroenterology

## 2023-03-29 VITALS — BP 117/78 | HR 78 | Temp 98.5°F | Ht 66.0 in | Wt 226.4 lb

## 2023-03-29 DIAGNOSIS — K219 Gastro-esophageal reflux disease without esophagitis: Secondary | ICD-10-CM

## 2023-03-29 DIAGNOSIS — K581 Irritable bowel syndrome with constipation: Secondary | ICD-10-CM | POA: Diagnosis not present

## 2023-03-29 DIAGNOSIS — R1011 Right upper quadrant pain: Secondary | ICD-10-CM | POA: Insufficient documentation

## 2023-03-29 DIAGNOSIS — R195 Other fecal abnormalities: Secondary | ICD-10-CM | POA: Insufficient documentation

## 2023-03-29 MED ORDER — IBSRELA 50 MG PO TABS: ORAL_TABLET | ORAL | 3 refills | Status: DC

## 2023-03-29 MED ORDER — PANTOPRAZOLE SODIUM 40 MG PO TBEC: 40.0000 mg | DELAYED_RELEASE_TABLET | Freq: Every day | ORAL | 3 refills | Status: DC

## 2023-03-29 NOTE — Patient Instructions (Addendum)
Continue with Ibsrela 50mg  twice daily, I will provide some samples in case you have any further shipping issues  Continue protonix 40mg  daily  Famotidine as needed  If swallowing worsens, please let me know as we may need to talk further about esophageal manometry testing at [facility] If you have recurrence of RUQ pain/ongoing green stools, please make me aware  Follow up 1 year  It was a pleasure to see you today. I want to create trusting relationships with patients and provide genuine, compassionate, and quality care. I truly value your feedback! please be on the lookout for a survey regarding your visit with me today. I appreciate your input about our visit and your time in completing this!    Marrianne Sica L. Jeanmarie Hubert, MSN, APRN, AGNP-C Adult-Gerontology Nurse Practitioner [facility]

## 2023-03-29 NOTE — Progress Notes (Signed)
Referring Provider: Juliette Alcide, MD Primary Care Physician:  Juliette Alcide, MD Primary GI Physician: Dr. Levon Hedger  Chief Complaint  Patient presents with   Gastroesophageal Reflux    Follow up on GERD. Takes protonix in the mornings and famotidine in the evenings if needed.    Constipation    Follow up on constipation. Taking isbrella. Makes her gasy but working better than [place].    HPI:   Kaitlin [name] is a 52 y.o. female with past medical history of  GERD, Hemorrhoids, IBS, IDA, HTN, rosacea.   Patient presenting today for follow up of GERD and IBS-C  Last seen march 2024, at that time having occasional GERD symptoms on PPI and H2B, continued globus sensation. Some dysphagia. Taking amitiza BID, though  feeling bloated and having 2 BMs per day. Did not tolerate linzess in the past. 1 episode of toilet tissue hematochezia  Recommend stopping omeprazole, starting protonix 40mg  daily, start ibsrela 50mg  BID. Colonoscopy and EGD  Present: She notes doing well on Ibsrela. She states she has more gurgling in her stomach when taking it but no other side effects. She is having a BM daily. Denies melena. She notes an episode of blood in her stool about 1 month ago noting she had some pain in her rectal area that felt sore almost like a tear. She notes this has resolved. No further issues with bleeding or pain. She does report just prior to that episode she went on a long trip that was about 2500 miles, she was not drinking as much water then and notes a little more constipation at that time. She reports that her ibsrela has been failed to be sent to her x2, she has had to go back to her amitiza both of those times to avoid constipation.   GERD is well managed on protonix 40mg  daily. She tries not to overeat as she will have reflux when she does. She does famotidine in the evenings PRN if she is having GERD symptoms in evenings. She is still having some issues with  dysphagia with certain foods like breads. She notes this is not occurring everyday. She does not want to do any further evaluation of this at this time. Can usually get foods to pass down.   Patient reports some green stools for a day last week. Also had some RUQ pain prior to this. Has had RUQ pain in the past with [place] and HIDA scan did not show stones or GB abnormalities. She denies nausea, vomiting. RUQ pain is very infrequent and green stools have resolved.    EGD: 09/2022- No endoscopic esophageal abnormality to explain                            patient's dysphagia. Esophagus dilated. Dilated.                           - 1 cm hiatal hernia.                           - Normal stomach.                           - Normal examined duodenum.                           -  No specimens collected. Colonoscopy: 09/2022- One 1 mm polyp in the ascending colon, removed                            with a cold biopsy forceps. Resected and retrieved.                           - Anal papilla(e) were hypertrophied. (1 SSL)   Repeat Colonoscopy in 5 years    Past Medical History:  Diagnosis Date   DDD (degenerative disc disease), lumbosacral    Family history of adverse reaction to anesthesia    father and brother--- ponv   GERD (gastroesophageal reflux disease)    Hemorrhoids    History of kidney stones    History of low potassium    per pt 11/ 2014 ED visit @[facility]  with right arm pain, ekg normal , but had to have IV potassium, no issues since   Hypertension    followed by pcp   (09-13-2019  per pt never had stress test)   Iron deficiency anemia secondary to blood loss (chronic)    followed by Dr Ellin Saba (hematology)    d/t uterine blood loss;  treated with intermittant iron infusions   Irritable bowel syndrome with constipation    Microcytic anemia    PMB (postmenopausal bleeding)    PONV (postoperative nausea and vomiting)    Rosacea    Sciatica, right side     Past Surgical History:   Procedure Laterality Date   ANTERIOR CERVICAL DECOMP/DISCECTOMY FUSION N/A 06/12/2013   Procedure: ANTERIOR CERVICAL DECOMPRESSION/DISCECTOMY FUSION 1 LEVEL;  Surgeon: Barnett Abu, MD;  Location: [facility];  Service: Neurosurgery;  Laterality: N/A;  C5-6 Anterior cervical decompression/diskectomy/fusion   APPENDECTOMY  06/23/2011   BREAST BIOPSY Right 2014   per pt benign   COLONOSCOPY W/ POLYPECTOMY  2012   COLONOSCOPY WITH PROPOFOL N/A 10/16/2022   Procedure: COLONOSCOPY WITH PROPOFOL;  Surgeon: Dolores Frame, MD;  Location: [facility];  Service: Gastroenterology;  Laterality: N/A;  11:30am; ASA 2   DIAGNOSTIC LAPAROSCOPY  2002   DILATATION & CURETTAGE/HYSTEROSCOPY WITH MYOSURE N/A 02/09/2022   Procedure: DILATATION & CURETTAGE/HYSTEROSCOPY WITH MYOSURE;  Surgeon: Olivia Mackie, MD;  Location: [facility];  Service: Gynecology;  Laterality: N/A;   DILITATION & CURRETTAGE/HYSTROSCOPY WITH NOVASURE ABLATION N/A 09/19/2019   Procedure: DILATATION & CURETTAGE/HYSTEROSCOPY;  Surgeon: Olivia Mackie, MD;  Location: [facility] ;  Service: Gynecology;  Laterality: N/A;   ESOPHAGOGASTRODUODENOSCOPY (EGD) WITH PROPOFOL N/A 10/16/2022   Procedure: ESOPHAGOGASTRODUODENOSCOPY (EGD) WITH PROPOFOL;  Surgeon: Dolores Frame, MD;  Location: [facility];  Service: Gastroenterology;  Laterality: N/A;  11:30am:ASA 2   FLEXIBLE SIGMOIDOSCOPY N/A 04/27/2018   Procedure: FLEXIBLE SIGMOIDOSCOPY;  Surgeon: Malissa Hippo, MD;  Location: [facility];  Service: Endoscopy;  Laterality: N/A;  8:30   FOOT SURGERY Right 2005   Bunionectomy   POLYPECTOMY  10/16/2022   Procedure: POLYPECTOMY;  Surgeon: Marguerita Merles, Reuel Boom, MD;  Location: [facility];  Service: Gastroenterology;;   ROBOTIC ASSISTED LAPAROSCOPIC LYSIS OF ADHESION N/A 09/19/2019   Procedure: XI ROBOTIC ASSISTED LAPAROSCOPIC LYSIS OF ADHESION/Ablation of Endometriosis;  Surgeon: Olivia Mackie, MD;  Location: [facility];  Service: Gynecology;  Laterality: N/A;   SAVORY DILATION  10/16/2022   Procedure: SAVORY DILATION;  Surgeon: Dolores Frame, MD;  Location: [facility];  Service: Gastroenterology;;  Current Outpatient Medications  Medication Sig Dispense Refill   b complex vitamins capsule Take 1 capsule by mouth daily.     cetirizine (ZYRTEC) 10 MG tablet Take 10 mg by mouth every evening.     CRANBERRY PO Take 1,000 mg by mouth at bedtime as needed (UTI Symptoms).     cyclobenzaprine (FLEXERIL) 10 MG tablet Take 10 mg by mouth 3 (three) times daily as needed for muscle spasms.     Estradiol-Progesterone (BIJUVA) 1-100 MG CAPS Take 1 capsule by mouth at bedtime.     Famotidine (PEPCID PO) Take by mouth. Takes 10mg  otc as needed.     fluticasone (FLONASE) 50 MCG/ACT nasal spray Place 2 sprays into both nostrils daily.      furosemide (LASIX) 40 MG tablet Take 40 mg by mouth daily.      gabapentin (NEURONTIN) 300 MG capsule Take 300 mg by mouth at bedtime.     ibuprofen (ADVIL) 200 MG tablet Take 800 mg by mouth at bedtime.     pantoprazole (PROTONIX) 40 MG tablet Take 1 tablet (40 mg total) by mouth daily. 60 tablet 3   potassium chloride (KLOR-CON M) 10 MEQ tablet Take 20 mEq by mouth daily.     Tenapanor HCl (IBSRELA) 50 MG TABS Take one bid 180 tablet 1   Vitamin D, Ergocalciferol, (DRISDOL) 1.25 MG (50000 UNIT) CAPS capsule Take 1 capsule (50,000 Units total) by mouth every 7 (seven) days. 12 capsule 3   No current facility-administered medications for this visit.    Allergies as of 03/29/2023 - Review Complete 03/29/2023  Allergen Reaction Noted   Erythromycin Itching 06/09/2013   Hydrocodone Itching 05/15/2011   Other Hives, Itching, Swelling, and Rash 05/15/2011    Family History  Problem Relation Age of Onset   Prostate cancer Father    Hypertension Brother     Social History   Socioeconomic History   Marital status:  Married    Spouse name: Not on file   Number of children: Not on file   Years of education: Not on file   Highest education level: Not on file  Occupational History   Not on file  Tobacco Use   Smoking status: Never   Smokeless tobacco: Never  Vaping Use   Vaping status: Never Used  Substance and Sexual Activity   Alcohol use: No   Drug use: Never   Sexual activity: Not on file  Other Topics Concern   Not on file  Social History Narrative   Not on file   Social Determinants of Health   Financial Resource Strain: Not on file  Food Insecurity: Not on file  Transportation Needs: Not on file  Physical Activity: Not on file  Stress: Not on file  Social Connections: Not on file    Review of systems General: negative for malaise, night sweats, fever, chills, weight loss Neck: Negative for lumps, goiter, pain and significant neck swelling Resp: Negative for cough, wheezing, dyspnea at rest CV: Negative for chest pain, leg swelling, palpitations, orthopnea GI: denies melena, hematochezia, nausea, vomiting, diarrhea, constipation, odynophagia, early satiety or unintentional weight loss. +RUQ pain, +green stools +dysphagia  MSK: Negative for joint pain or swelling, back pain, and muscle pain. Derm: Negative for itching or rash Psych: Denies depression, anxiety, memory loss, confusion. No homicidal or suicidal ideation.  Heme: Negative for prolonged bleeding, bruising easily, and swollen nodes. Endocrine: Negative for cold or heat intolerance, polyuria, polydipsia and goiter. Neuro: negative for tremor, gait imbalance, syncope  and seizures. The remainder of the review of systems is noncontributory.  Physical Exam: BP 117/78 (BP Location: Right Arm, Patient Position: Sitting, Cuff Size: Large)   Pulse 78   Temp 98.5 F (36.9 C) (Oral)   Ht 5\' 6"  (1.676 m)   Wt 226 lb 6.4 oz (102.7 kg)   LMP 03/10/2021   BMI 36.54 kg/m  General:   Alert and oriented. No distress noted.  Pleasant and cooperative.  Head:  Normocephalic and atraumatic. Eyes:  Conjuctiva clear without scleral icterus. Mouth:  Oral mucosa pink and moist. Good dentition. No lesions. Heart: Normal rate and rhythm, s1 and s2 heart sounds present.  Lungs: Clear lung sounds in all lobes. Respirations equal and unlabored. Abdomen:  +BS, soft, non-tender and non-distended. No rebound or guarding. No HSM or masses noted. Derm: No palmar erythema or jaundice Msk:  Symmetrical without gross deformities. Normal posture. Extremities:  Without edema. Neurologic:  Alert and  oriented x4 Psych:  Alert and cooperative. Normal mood and affect.  Invalid input(s): "6 MONTHS"   ASSESSMENT: ZEMIRA ZEHRING is a 52 y.o. female presenting today for IBS-C GERD/dysphagia and with some RUQ pain/green stools  IBS-C: doing well on ibsrela, having 1 BM per day. Notes some gurgling in her stomach but otherwise no side effects. Has had some issues with receiving her shipment of ibsrela, will provide some samples for her to keep on hand in case this occurs again. She did contact the company to make them aware of her issue with receiving the shipment. Will continue with Ibsrela 50mg  BID.   GERD/Dysphagia: doing better on protonix 40mg  daily. Taking famotidine PRN. Some dysphagia on occasion with thicker, drier foods but notes this is not occurring daily. Discussed pH impedence and esophageal manometry which she does not wish to pursue at this time. She will make me aware if dysphagia worsens. Should continue to avoid thicker, drier foods, take small bites, chew thoroughly and take sips of liquid between bites. Will continue with PPI daily and H2B PRN   RUQ pain/green stools: some green colored stools one day last week that resolved. Notes some RUQ pain prior to this that also resolved. No nausea or vomiting. Has intermittent RUQ pain that occurs infrequently. Has had GB evaluated in the past (per patient) without finding of  stones, and normal GB function. She will make me aware if she has recurrence of RUQ pain, associated nausea/vomiting or green stools that do not resolve. May need repeat evaluation of GB if these occur.    PLAN:  Continue with Ibsrela 50mg  BID (will give some samples)  2. Continue protonix 40mg  daily  3. Famotidine PRN  4. Consider pH impedence/esophageal manometry 5. Chewing precautions 6. Pt to make me aware of recurrent RUQ Pain/green stools, associated nausea/ vomiting   All questions were answered, patient verbalized understanding and is in agreement with plan as outlined above.   Follow Up: 1 year   Augustina Braddock L. Jeanmarie Hubert, MSN, APRN, AGNP-C Adult-Gerontology Nurse Practitioner [facility]  I have reviewed the note and agree with the APP's assessment as described in this progress note  Katrinka Blazing, MD Gastroenterology and Hepatology [facility] Gastroenterology

## 2023-05-21 ENCOUNTER — Other Ambulatory Visit (INDEPENDENT_AMBULATORY_CARE_PROVIDER_SITE_OTHER): Payer: Self-pay | Admitting: Gastroenterology

## 2023-06-01 ENCOUNTER — Other Ambulatory Visit: Payer: Self-pay | Admitting: *Deleted

## 2023-06-01 DIAGNOSIS — R739 Hyperglycemia, unspecified: Secondary | ICD-10-CM

## 2023-06-01 DIAGNOSIS — I10 Essential (primary) hypertension: Secondary | ICD-10-CM

## 2023-06-03 ENCOUNTER — Inpatient Hospital Stay: Payer: BC Managed Care – PPO

## 2023-06-04 ENCOUNTER — Inpatient Hospital Stay: Payer: BC Managed Care – PPO | Attending: Physician Assistant

## 2023-06-04 DIAGNOSIS — R739 Hyperglycemia, unspecified: Secondary | ICD-10-CM

## 2023-06-04 DIAGNOSIS — I10 Essential (primary) hypertension: Secondary | ICD-10-CM

## 2023-06-04 DIAGNOSIS — E559 Vitamin D deficiency, unspecified: Secondary | ICD-10-CM | POA: Insufficient documentation

## 2023-06-04 DIAGNOSIS — D500 Iron deficiency anemia secondary to blood loss (chronic): Secondary | ICD-10-CM | POA: Diagnosis not present

## 2023-06-04 DIAGNOSIS — D509 Iron deficiency anemia, unspecified: Secondary | ICD-10-CM

## 2023-06-04 DIAGNOSIS — N920 Excessive and frequent menstruation with regular cycle: Secondary | ICD-10-CM | POA: Insufficient documentation

## 2023-06-04 LAB — COMPREHENSIVE METABOLIC PANEL
ALT: 18 U/L (ref 0–44)
AST: 16 U/L (ref 15–41)
Albumin: 3.8 g/dL (ref 3.5–5.0)
Alkaline Phosphatase: 70 U/L (ref 38–126)
Anion gap: 9 (ref 5–15)
BUN: 17 mg/dL (ref 6–20)
CO2: 26 mmol/L (ref 22–32)
Calcium: 8.9 mg/dL (ref 8.9–10.3)
Chloride: 104 mmol/L (ref 98–111)
Creatinine, Ser: 0.82 mg/dL (ref 0.44–1.00)
GFR, Estimated: >60 mL/min (ref >60)
Glucose, Bld: 109 mg/dL — ABNORMAL HIGH (ref 70–99)
Potassium: 3.8 mmol/L (ref 3.5–5.1)
Sodium: 139 mmol/L (ref 135–145)
Total Bilirubin: 0.4 mg/dL (ref <1.2)
Total Protein: 6.8 g/dL (ref 6.5–8.1)

## 2023-06-04 LAB — CBC
HCT: 37.1 % (ref 36.0–46.0)
Hemoglobin: 12.6 g/dL (ref 12.0–15.0)
MCH: 31.4 pg (ref 26.0–34.0)
MCHC: 34 g/dL (ref 30.0–36.0)
MCV: 92.5 fL (ref 80.0–100.0)
Platelets: 297 10*3/uL (ref 150–400)
RBC: 4.01 MIL/uL (ref 3.87–5.11)
RDW: 11.9 % (ref 11.5–15.5)
WBC: 7.4 10*3/uL (ref 4.0–10.5)
nRBC: 0 % (ref 0.0–0.2)

## 2023-06-04 LAB — IRON AND TIBC
Iron: 66 ug/dL (ref 28–170)
Saturation Ratios: 21 % (ref 10.4–31.8)
TIBC: 318 ug/dL (ref 250–450)
UIBC: 252 ug/dL

## 2023-06-04 LAB — TSH: TSH: 2.777 u[IU]/mL (ref 0.350–4.500)

## 2023-06-04 LAB — VITAMIN D 25 HYDROXY (VIT D DEFICIENCY, FRACTURES): Vit D, 25-Hydroxy: 36.28 ng/mL (ref 30–100)

## 2023-06-04 LAB — LIPID PANEL
Cholesterol: 235 mg/dL — ABNORMAL HIGH (ref 0–200)
HDL: 61 mg/dL (ref >40)
LDL Cholesterol: 150 mg/dL — ABNORMAL HIGH (ref 0–99)
Total CHOL/HDL Ratio: 3.9 {ratio}
Triglycerides: 122 mg/dL (ref <150)
VLDL: 24 mg/dL (ref 0–40)

## 2023-06-04 LAB — HEMOGLOBIN A1C
Hgb A1c MFr Bld: 5.1 % (ref 4.8–5.6)
Mean Plasma Glucose: 99.67 mg/dL

## 2023-06-04 LAB — FERRITIN: Ferritin: 110 ng/mL (ref 11–307)

## 2023-06-10 ENCOUNTER — Inpatient Hospital Stay: Payer: BC Managed Care – PPO | Admitting: Hematology

## 2023-06-10 VITALS — BP 148/84 | HR 77 | Temp 96.8°F | Resp 18 | Wt 235.9 lb

## 2023-06-10 DIAGNOSIS — N920 Excessive and frequent menstruation with regular cycle: Secondary | ICD-10-CM | POA: Diagnosis not present

## 2023-06-10 DIAGNOSIS — D500 Iron deficiency anemia secondary to blood loss (chronic): Secondary | ICD-10-CM | POA: Diagnosis not present

## 2023-06-10 DIAGNOSIS — D509 Iron deficiency anemia, unspecified: Secondary | ICD-10-CM | POA: Diagnosis not present

## 2023-06-10 DIAGNOSIS — E559 Vitamin D deficiency, unspecified: Secondary | ICD-10-CM

## 2023-06-10 NOTE — Progress Notes (Signed)
[facility] [address] [place] [address]    Clinic Day:  06/10/2023  Referring physician: Juliette Alcide, MD  Patient Care Team: Juliette Alcide, MD as PCP - General (Family Medicine)   ASSESSMENT & PLAN:   Assessment: 1.  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 - Hemoccult stool negative x 3 in August 2020 - Cannot tolerate iron pills because of severe constipation.  Plan: 1.  Iron deficiency state:  - She is currently on Bijuva and did not have any vaginal bleeding since August 2023. - She reports having tiredness and also craving ice. - Labs from 06/04/2023: Ferritin 110 and hemoglobin dropped to 12.6 from 13.5. - Because of fatigue, recommend 1 infusion of Feraheme 510 mg IV. - RTC 6 months for follow-up with repeat labs.   2.  Vitamin D deficiency - Vitamin D level improved to 36 from 21. - Continue vitamin D 50,000 units weekly.  Recheck level in 6 months.     Orders Placed This Encounter  Procedures   CBC with Differential    Standing Status:   Future    Expected Date:   12/02/2023    Expiration Date:   06/09/2024   Comprehensive metabolic panel    Standing Status:   Future    Expected Date:   12/02/2023    Expiration Date:   06/09/2024   Iron and TIBC ([facility] ONLY)    Standing Status:   Future    Expected Date:   12/02/2023    Expiration Date:   06/09/2024   Ferritin    Standing Status:   Future    Expected Date:   12/02/2023    Expiration Date:   06/09/2024   Vitamin D 25 hydroxy    Standing Status:   Future    Expected Date:   12/02/2023    Expiration Date:   06/09/2024      Mikeal Hawthorne R Teague,acting as a scribe for Doreatha Massed, MD.,have documented all relevant documentation on the behalf of Doreatha Massed, MD,as directed by   Doreatha Massed, MD while in the presence of Doreatha Massed, MD.  I, Doreatha Massed MD, have reviewed the above documentation for accuracy and completeness, and I agree with the above.    Doreatha Massed, MD   12/12/20244:54 PM  CHIEF COMPLAINT:   Diagnosis: iron deficiency anemia    Cancer Staging  No matching staging information was found for the patient.    Prior Therapy: none  Current Therapy:  Intermittent IV iron    HISTORY OF PRESENT ILLNESS:   Oncology History   No history exists.     INTERVAL HISTORY:   Kaitlin [name] is a 52 y.o. female presenting to clinic today for follow up of iron deficiency anemia. She was last seen by me on 11/26/22.  Today, she states that she is doing well overall. Her appetite level is at 90%. Her energy level is at 60%.  She is taking 50,000 units of vitamin D weekly and Bijuva as prescribed. She denies any vaginal bleeding, BRBPR, or hematochezia. She would like to receive IV iron today to improve energy levels, as they have been low recently and has had mildly worsened fatigue. She did not have any allergic reactions with her last IV iron. She notes increasing amounts of ice pica over the past few months.  PAST MEDICAL HISTORY:   Past Medical History: Past Medical History:  Diagnosis Date   DDD (degenerative disc disease), lumbosacral    Family history of adverse reaction to anesthesia    father and brother--- ponv   GERD (gastroesophageal reflux disease)    Hemorrhoids    History of kidney stones    History of low potassium    per pt 11/ 2014 ED visit @[facility]  with right arm pain, ekg normal , but had to have IV potassium, no issues since   Hypertension    followed by pcp   (09-13-2019  per pt never had stress test)   Iron deficiency anemia secondary to blood loss (chronic)    followed by Dr Ellin Saba (hematology)    d/t uterine blood loss;  treated with intermittant iron infusions   Irritable bowel syndrome  with constipation    Microcytic anemia    PMB (postmenopausal bleeding)    PONV (postoperative nausea and vomiting)    Rosacea    Sciatica, right side     Surgical History: Past Surgical History:  Procedure Laterality Date   ANTERIOR CERVICAL DECOMP/DISCECTOMY FUSION N/A 06/12/2013   Procedure: ANTERIOR CERVICAL DECOMPRESSION/DISCECTOMY FUSION 1 LEVEL;  Surgeon: Barnett Abu, MD;  Location: [facility];  Service: Neurosurgery;  Laterality: N/A;  C5-6 Anterior cervical decompression/diskectomy/fusion   APPENDECTOMY  06/23/2011   BREAST BIOPSY Right 2014   per pt benign   COLONOSCOPY W/ POLYPECTOMY  2012   COLONOSCOPY WITH PROPOFOL N/A 10/16/2022   Procedure: COLONOSCOPY WITH PROPOFOL;  Surgeon: Dolores Frame, MD;  [facility];  Service: Gastroenterology;  Laterality: N/A;  11:30am; ASA 2   DIAGNOSTIC LAPAROSCOPY  2002   DILATATION & CURETTAGE/HYSTEROSCOPY WITH MYOSURE N/A 02/09/2022   Procedure: DILATATION & CURETTAGE/HYSTEROSCOPY WITH MYOSURE;  Surgeon: Olivia Mackie, MD;  Location: [facility];  Service: Gynecology;  Laterality: N/A;   DILITATION & CURRETTAGE/HYSTROSCOPY WITH NOVASURE ABLATION N/A 09/19/2019   Procedure: DILATATION & CURETTAGE/HYSTEROSCOPY;  Surgeon: Olivia Mackie, MD;  Location: [facility] ;  Service: Gynecology;  Laterality: N/A;   ESOPHAGOGASTRODUODENOSCOPY (EGD) WITH PROPOFOL N/A 10/16/2022   Procedure: ESOPHAGOGASTRODUODENOSCOPY (EGD) WITH PROPOFOL;  Surgeon: Dolores Frame, MD;  [facility];  Service: Gastroenterology;  Laterality: N/A;  11:30am:ASA 2   FLEXIBLE SIGMOIDOSCOPY N/A 04/27/2018   Procedure: FLEXIBLE SIGMOIDOSCOPY;  Surgeon: Malissa Hippo, MD;  [facility];  Service: Endoscopy;  Laterality: N/A;  8:30   FOOT SURGERY Right 2005   Bunionectomy   POLYPECTOMY  10/16/2022   Procedure: POLYPECTOMY;  Surgeon: Marguerita Merles, Reuel Boom, MD;  [facility];   Service: Gastroenterology;;   ROBOTIC ASSISTED LAPAROSCOPIC LYSIS OF ADHESION N/A 09/19/2019   Procedure: XI ROBOTIC ASSISTED LAPAROSCOPIC LYSIS OF ADHESION/Ablation of Endometriosis;  Surgeon: Olivia Mackie, MD;  Location: [facility];  Service: Gynecology;  Laterality: N/A;   SAVORY DILATION  10/16/2022   Procedure: SAVORY DILATION;  Surgeon: Marguerita Merles, Reuel Boom, MD;  [facility];  Service: Gastroenterology;;    Social History: Social History   Socioeconomic History   Marital status: Married    Spouse name: Not on file   Number of children: Not on file   Years of education: Not on file   Highest education level: Not on file  Occupational History   Not on file  Tobacco Use   Smoking status: Never   Smokeless tobacco: Never  Vaping Use   Vaping status: Never Used  Substance and Sexual  Activity   Alcohol use: No   Drug use: Never   Sexual activity: Not on file  Other Topics Concern   Not on file  Social History Narrative   Not on file   Social Drivers of Health   Financial Resource Strain: Not on file  Food Insecurity: Not on file  Transportation Needs: Not on file  Physical Activity: Not on file  Stress: Not on file  Social Connections: Not on file  Intimate Partner Violence: Not on file    Family History: Family History  Problem Relation Age of Onset   Prostate cancer Father    Hypertension Brother     Current Medications:  Current Outpatient Medications:    amoxicillin-clavulanate (AUGMENTIN) 875-125 MG tablet, Take 1 tablet by mouth 2 (two) times daily., Disp: , Rfl:    metFORMIN (GLUCOPHAGE-XR) 500 MG 24 hr tablet, Take 500 mg by mouth daily., Disp: , Rfl:    rosuvastatin (CRESTOR) 5 MG tablet, Take 5 mg by mouth at bedtime., Disp: , Rfl:    b complex vitamins capsule, Take 1 capsule by mouth daily., Disp: , Rfl:    cetirizine (ZYRTEC) 10 MG tablet, Take 10 mg by mouth every evening., Disp: , Rfl:    CRANBERRY PO, Take  1,000 mg by mouth at bedtime as needed (UTI Symptoms)., Disp: , Rfl:    cyclobenzaprine (FLEXERIL) 10 MG tablet, Take 10 mg by mouth 3 (three) times daily as needed for muscle spasms., Disp: , Rfl:    Estradiol-Progesterone (BIJUVA) 1-100 MG CAPS, Take 1 capsule by mouth at bedtime., Disp: , Rfl:    Famotidine (PEPCID PO), Take by mouth. Takes 10mg  otc as needed., Disp: , Rfl:    fluticasone (FLONASE) 50 MCG/ACT nasal spray, Place 2 sprays into both nostrils daily. , Disp: , Rfl:    furosemide (LASIX) 40 MG tablet, Take 40 mg by mouth daily. , Disp: , Rfl:    gabapentin (NEURONTIN) 300 MG capsule, Take 300 mg by mouth at bedtime., Disp: , Rfl:    ibuprofen (ADVIL) 200 MG tablet, Take 800 mg by mouth at bedtime., Disp: , Rfl:    pantoprazole (PROTONIX) 40 MG tablet, Take 1 tablet (40 mg total) by mouth daily., Disp: 90 tablet, Rfl: 3   potassium chloride (KLOR-CON M) 10 MEQ tablet, Take 20 mEq by mouth daily., Disp: , Rfl:    Tenapanor HCl (IBSRELA) 50 MG TABS, Take one bid, Disp: 180 tablet, Rfl: 3   Vitamin D, Ergocalciferol, (DRISDOL) 1.25 MG (50000 UNIT) CAPS capsule, Take 1 capsule (50,000 Units total) by mouth every 7 (seven) days., Disp: 12 capsule, Rfl: 3   Allergies: Allergies  Allergen Reactions   Erythromycin Itching   Hydrocodone Itching   Other Hives, Itching, Swelling and Rash    Surgical glue---- Dermabond     REVIEW OF SYSTEMS:   Review of Systems  Constitutional:  Positive for fatigue. Negative for chills and fever.  HENT:   Negative for lump/mass, mouth sores, nosebleeds, sore throat and trouble swallowing.   Eyes:  Negative for eye problems.  Respiratory:  Positive for shortness of breath (with exertion). Negative for cough.   Cardiovascular:  Positive for palpitations (occasional). Negative for chest pain and leg swelling.  Gastrointestinal:  Negative for abdominal pain, constipation, diarrhea, nausea and vomiting.  Genitourinary:  Negative for bladder incontinence,  difficulty urinating, dysuria, frequency, hematuria and nocturia.   Musculoskeletal:  Negative for arthralgias, back pain, flank pain, myalgias and neck pain.  Skin:  Negative for  itching and rash.  Neurological:  Negative for dizziness, headaches and numbness.       +tingling in right arm  Hematological:  Does not bruise/bleed easily.  Psychiatric/Behavioral:  Negative for depression, sleep disturbance and suicidal ideas. The patient is not nervous/anxious.   All other systems reviewed and are negative.    VITALS:   Blood pressure (!) 148/84, pulse 77, temperature (!) 96.8 F (36 C), temperature source Tympanic, resp. rate 18, weight 235 lb 14.3 oz (107 kg), last menstrual period 03/10/2021, SpO2 100%.  Wt Readings from Last 3 Encounters:  06/10/23 235 lb 14.3 oz (107 kg)  03/29/23 226 lb 6.4 oz (102.7 kg)  11/26/22 213 lb 12.8 oz (97 kg)    Body mass index is 38.07 kg/m.  Performance status (ECOG): 0 - Asymptomatic  PHYSICAL EXAM:   Physical Exam Vitals and nursing note reviewed. Exam conducted with a chaperone present.  Constitutional:      Appearance: Normal appearance.  Cardiovascular:     Rate and Rhythm: Normal rate and regular rhythm.     Pulses: Normal pulses.     Heart sounds: Normal heart sounds.  Pulmonary:     Effort: Pulmonary effort is normal.     Breath sounds: Normal breath sounds.  Abdominal:     Palpations: Abdomen is soft. There is no hepatomegaly, splenomegaly or mass.     Tenderness: There is no abdominal tenderness.  Musculoskeletal:     Right lower leg: No edema.     Left lower leg: No edema.  Lymphadenopathy:     Cervical: No cervical adenopathy.     Right cervical: No superficial, deep or posterior cervical adenopathy.    Left cervical: No superficial, deep or posterior cervical adenopathy.     Upper Body:     Right upper body: No supraclavicular or axillary adenopathy.     Left upper body: No supraclavicular or axillary adenopathy.   Neurological:     General: No focal deficit present.     Mental Status: She is alert and oriented to person, place, and time.  Psychiatric:        Mood and Affect: Mood normal.        Behavior: Behavior normal.     LABS:      Latest Ref Rng & Units 06/04/2023    7:48 AM 11/19/2022    3:16 PM 04/09/2022    2:57 PM  CBC  WBC 4.0 - 10.5 K/uL 7.4  6.8  5.7   Hemoglobin 12.0 - 15.0 g/dL 56.2  13.0  86.5   Hematocrit 36.0 - 46.0 % 37.1  39.9  38.1   Platelets 150 - 400 K/uL 297  299  292       Latest Ref Rng & Units 06/04/2023    7:48 AM 11/19/2022    3:16 PM 02/09/2022    7:37 AM  CMP  Glucose 70 - 99 mg/dL 784  99  696   BUN 6 - 20 mg/dL 17  16  11    Creatinine 0.44 - 1.00 mg/dL 2.95  2.84  1.32   Sodium 135 - 145 mmol/L 139  139  141   Potassium 3.5 - 5.1 mmol/L 3.8  3.6  3.6   Chloride 98 - 111 mmol/L 104  101  102   CO2 22 - 32 mmol/L 26  28    Calcium 8.9 - 10.3 mg/dL 8.9  8.5    Total Protein 6.5 - 8.1 g/dL 6.8  7.4  Total Bilirubin <1.2 mg/dL 0.4  0.7    Alkaline Phos 38 - 126 U/L 70  70    AST 15 - 41 U/L 16  15    ALT 0 - 44 U/L 18  15       No results found for: "CEA1", "CEA" / No results found for: "CEA1", "CEA" No results found for: "PSA1" No results found for: "NFA213" No results found for: "CAN125"  Lab Results  Component Value Date   TOTALPROTELP 7.1 02/13/2019   ALBUMINELP 4.0 02/13/2019   A1GS 0.3 02/13/2019   A2GS 0.8 02/13/2019   BETS 1.0 02/13/2019   GAMS 1.1 02/13/2019   MSPIKE Not Observed 02/13/2019   SPEI Comment 02/13/2019   Lab Results  Component Value Date   TIBC 318 06/04/2023   TIBC 341 11/19/2022   TIBC 325 04/09/2022   FERRITIN 110 06/04/2023   FERRITIN 139 11/19/2022   FERRITIN 74 04/09/2022   IRONPCTSAT 21 06/04/2023   IRONPCTSAT 19 11/19/2022   IRONPCTSAT 20 04/09/2022   Lab Results  Component Value Date   LDH 142 10/01/2021   LDH 148 03/27/2021   LDH 159 09/17/2020     STUDIES:   No results found.

## 2023-06-10 NOTE — Patient Instructions (Signed)
[facility] at [facility] Discharge Instructions   You were seen and examined today by Dr. Ellin Saba.  He reviewed the results of your lab work which are normal/stable. Your iron is a little low. Since you are feeling tired we will arrange for you to have an iron infusion to help with your energy.  We will see you back in 6 months. We will repeat lab work prior to this visit.   Return as scheduled.    Thank you for choosing [facility] at [facility] to provide your oncology and hematology care.  To afford each patient quality time with our provider, please arrive at least 15 minutes before your scheduled appointment time.   If you have a lab appointment with the Cancer Center please come in thru the Main Entrance and check in at the main information desk.  You need to re-schedule your appointment should you arrive 10 or more minutes late.  We strive to give you quality time with our providers, and arriving late affects you and other patients whose appointments are after yours.  Also, if you no show three or more times for appointments you may be dismissed from the clinic at the providers discretion.     Again, thank you for choosing [facility].  Our hope is that these requests will decrease the amount of time that you wait before being seen by our physicians.       _____________________________________________________________  Should you have questions after your visit to [facility], please contact our office at [phone] and follow the prompts.  Our office hours are 8:00 a.m. and 4:30 p.m. Monday - Friday.  Please note that voicemails left after 4:00 p.m. may not be returned until the following business day.  We are closed weekends and major holidays.  You do have access to a nurse 24-7, just call the main number to the clinic [phone] and do not press any options, hold on the line and a nurse will answer the  phone.    For prescription refill requests, have your pharmacy contact our office and allow 72 hours.    Due to Covid, you will need to wear a mask upon entering the hospital. If you do not have a mask, a mask will be given to you at the Main Entrance upon arrival. For doctor visits, patients may have 1 support person age 43 or older with them. For treatment visits, patients can not have anyone with them due to social distancing guidelines and our immunocompromised population.

## 2023-06-18 ENCOUNTER — Inpatient Hospital Stay: Payer: BC Managed Care – PPO

## 2023-06-18 VITALS — BP 137/86 | HR 72 | Temp 97.4°F | Resp 18

## 2023-06-18 DIAGNOSIS — N920 Excessive and frequent menstruation with regular cycle: Secondary | ICD-10-CM | POA: Diagnosis not present

## 2023-06-18 DIAGNOSIS — D509 Iron deficiency anemia, unspecified: Secondary | ICD-10-CM

## 2023-06-18 DIAGNOSIS — D500 Iron deficiency anemia secondary to blood loss (chronic): Secondary | ICD-10-CM | POA: Diagnosis not present

## 2023-06-18 DIAGNOSIS — E559 Vitamin D deficiency, unspecified: Secondary | ICD-10-CM | POA: Diagnosis not present

## 2023-06-18 MED ORDER — SODIUM CHLORIDE 0.9 % IV SOLN: INTRAVENOUS | Status: DC

## 2023-06-18 MED ORDER — SODIUM CHLORIDE 0.9 % IV SOLN
510.0000 mg | Freq: Once | INTRAVENOUS | Status: AC
  Administered 2023-06-18: 510 mg via INTRAVENOUS
  Filled 2023-06-18: qty 510

## 2023-06-18 NOTE — Progress Notes (Signed)
Patient tolerated iron infusion with no complaints voiced.  Peripheral IV site clean and dry with good blood return noted before and after infusion.  Band aid applied.  VSS with discharge and left in satisfactory condition with no s/s of distress noted.   

## 2023-06-18 NOTE — Patient Instructions (Signed)
 [facility] [place] - A DEPT OF [facility]  Discharge Instructions: Thank you for choosing [facility] to provide your oncology and hematology care.  If you have a lab appointment with the Cancer Center - please note that after April 8th, 2024, all labs will be drawn in the cancer center.  You do not have to check in or register with the main entrance as you have in the past but will complete your check-in in the cancer center.  Wear comfortable clothing and clothing appropriate for easy access to any Portacath or PICC line.   We strive to give you quality time with your provider. You may need to reschedule your appointment if you arrive late (15 or more minutes).  Arriving late affects you and other patients whose appointments are after yours.  Also, if you miss three or more appointments without notifying the office, you may be dismissed from the clinic at the provider's discretion.      For prescription refill requests, have your pharmacy contact our office and allow 72 hours for refills to be completed.    Today you received the following Feraheme, return as scheduled.   To help prevent nausea and vomiting after your treatment, we encourage you to take your nausea medication as directed.  BELOW ARE SYMPTOMS THAT SHOULD BE REPORTED IMMEDIATELY: *FEVER GREATER THAN 100.4 F (38 C) OR HIGHER *CHILLS OR SWEATING *NAUSEA AND VOMITING THAT IS NOT CONTROLLED WITH YOUR NAUSEA MEDICATION *UNUSUAL SHORTNESS OF BREATH *UNUSUAL BRUISING OR BLEEDING *URINARY PROBLEMS (pain or burning when urinating, or frequent urination) *BOWEL PROBLEMS (unusual diarrhea, constipation, pain near the anus) TENDERNESS IN MOUTH AND THROAT WITH OR WITHOUT PRESENCE OF ULCERS (sore throat, sores in mouth, or a toothache) UNUSUAL RASH, SWELLING OR PAIN  UNUSUAL VAGINAL DISCHARGE OR ITCHING   Items with * indicate a potential emergency and should be followed up as soon as possible or  go to the Emergency Department if any problems should occur.  Please show the CHEMOTHERAPY ALERT CARD or IMMUNOTHERAPY ALERT CARD at check-in to the Emergency Department and triage nurse.  Should you have questions after your visit or need to cancel or reschedule your appointment, please contact [facility] [place] - A DEPT OF [facility] [phone]  and follow the prompts.  Office hours are 8:00 a.m. to 4:30 p.m. Monday - Friday. Please note that voicemails left after 4:00 p.m. may not be returned until the following business day.  We are closed weekends and major holidays. You have access to a nurse at all times for urgent questions. Please call the main number to the clinic [phone] and follow the prompts.  For any non-urgent questions, you may also contact your provider using MyChart. We now offer e-Visits for anyone 71 and older to request care online for non-urgent symptoms. For details visit [email or web address].   Also download the MyChart app! Go to the app store, search "MyChart", open the app, select [place], and log in with your MyChart username and password.

## 2023-07-07 IMAGING — MR MR LUMBAR SPINE W/O CM
5 series · 31 of 48 positions shown · non-contrast
Comparison: None available.

CLINICAL DATA: Initial evaluation for lower back pain extending
into the right groin and hip for 6 months.

EXAM:
MRI LUMBAR SPINE WITHOUT CONTRAST
TECHNIQUE: Multiplanar, multisequence MR imaging of the lumbar spine was
performed. No intravenous contrast was administered.

[Series 5: T2 · sagittal · 4.0mm · 0.68mm/px · 7 of 15 slices shown (1 of 2)]
[im 1/15]
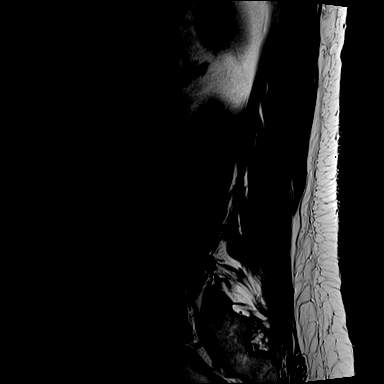
[im 3/15]
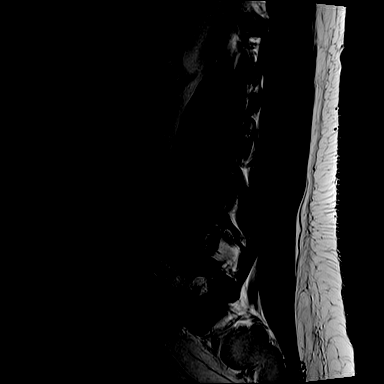
[im 5/15]
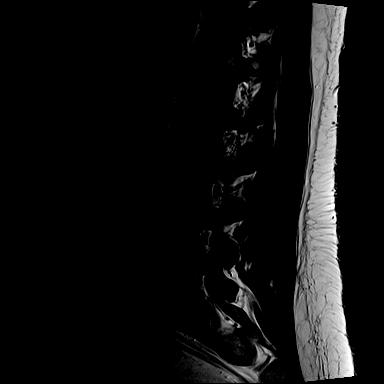
[im 8/15]
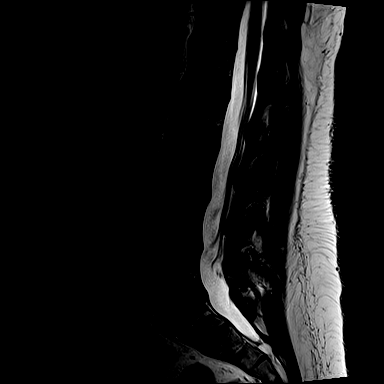
[im 10/15]
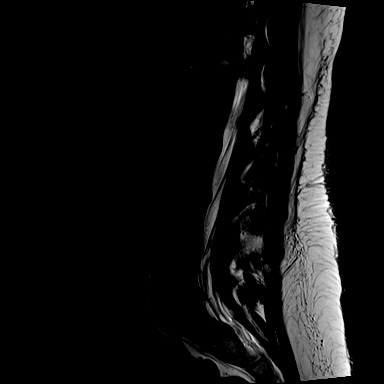
[im 12/15]
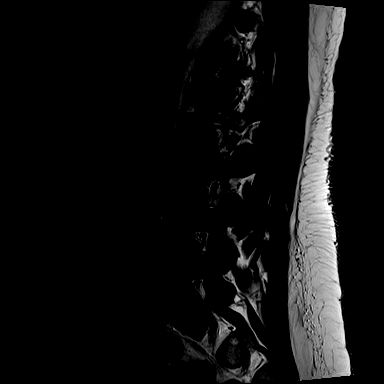
[im 15/15]
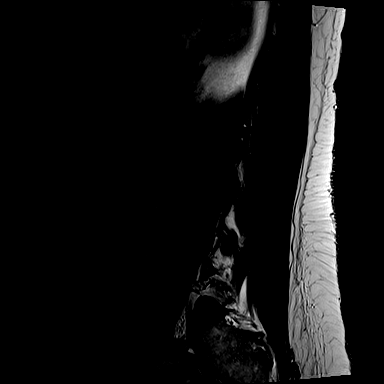

[Series 6: T1 · sagittal · 4.0mm · 0.81mm/px · 7 of 15 slices shown (1 of 2)]
[im 1/15]
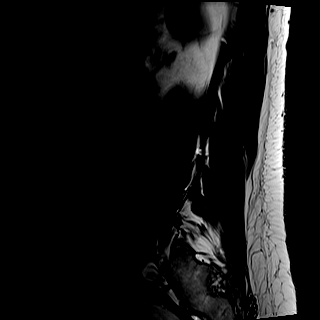
[im 3/15]
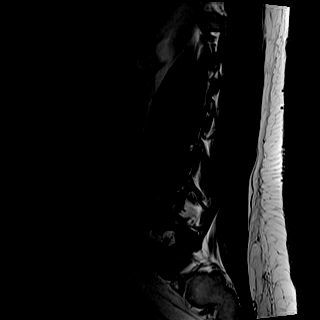
[im 5/15]
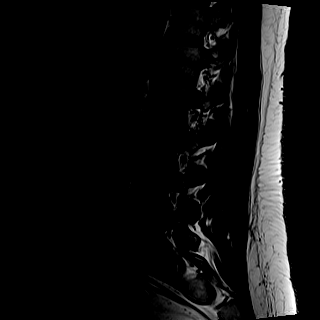
[im 8/15]
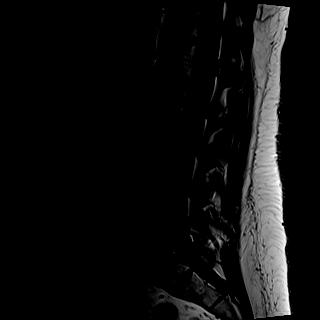
[im 10/15]
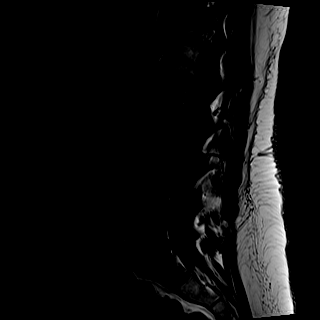
[im 12/15]
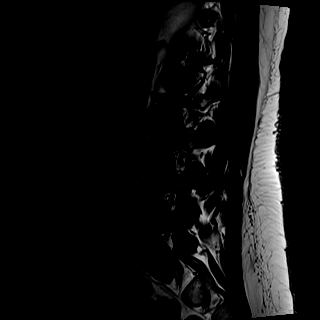
[im 15/15]
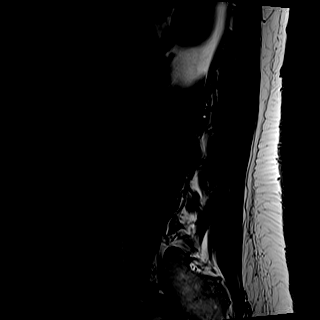

[Series 7: STIR · sagittal · 4.0mm · 0.51mm/px · 1 of 15 slices shown]
[im 1/15]
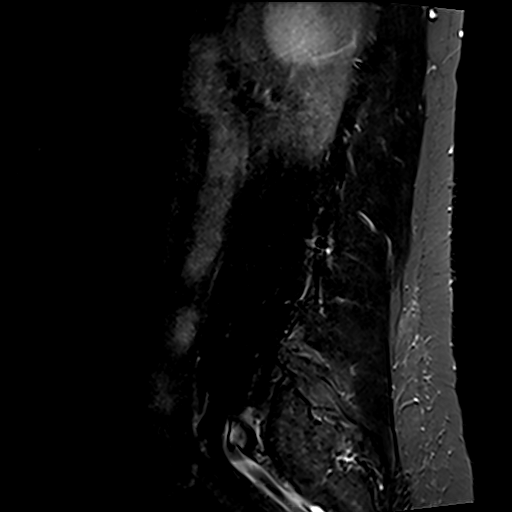

[Series 8: T2 · axial · 4.0mm · 0.70mm/px · z∈[-126,+69]mm · 8 of 34 slices shown (2 of 2)]
[im 1/34]
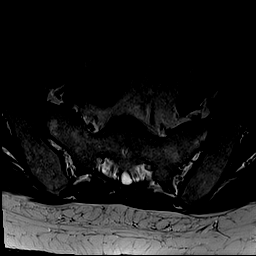
[im 6/34]
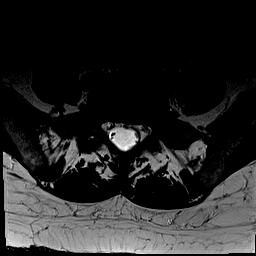
[im 11/34]
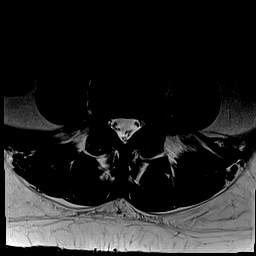
[im 16/34]
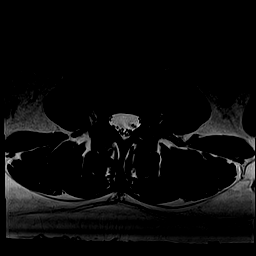
[im 18/34]
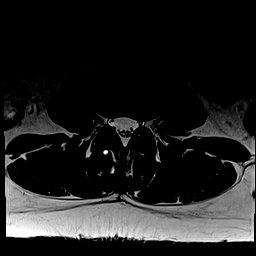
[im 23/34]
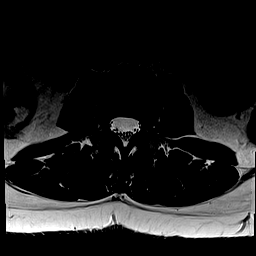
[im 28/34]
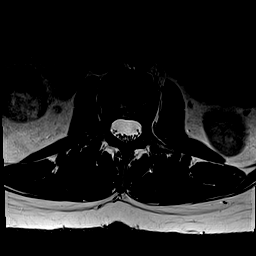
[im 34/34]
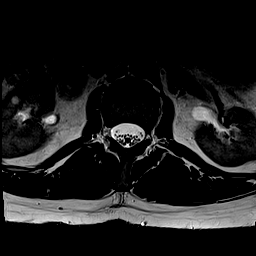

[Series 9: T1 · axial · 4.0mm · 0.35mm/px · z∈[-126,+69]mm · 8 of 34 slices shown (2 of 2)]
[im 1/34]
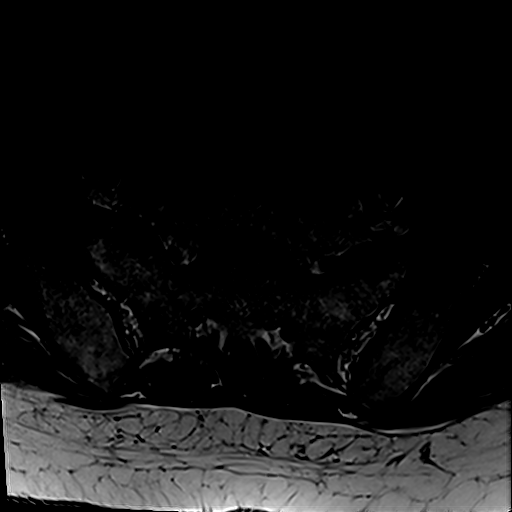
[im 6/34]
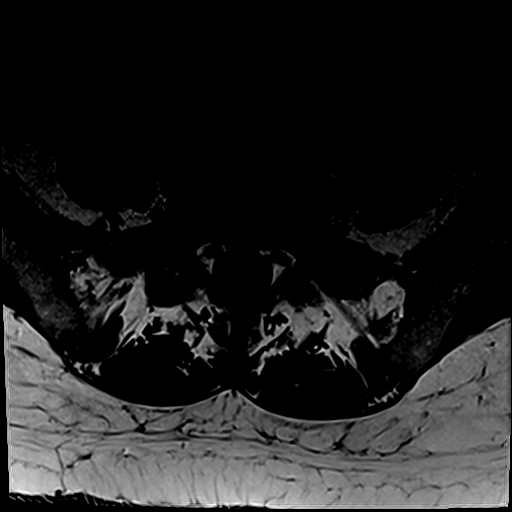
[im 11/34]
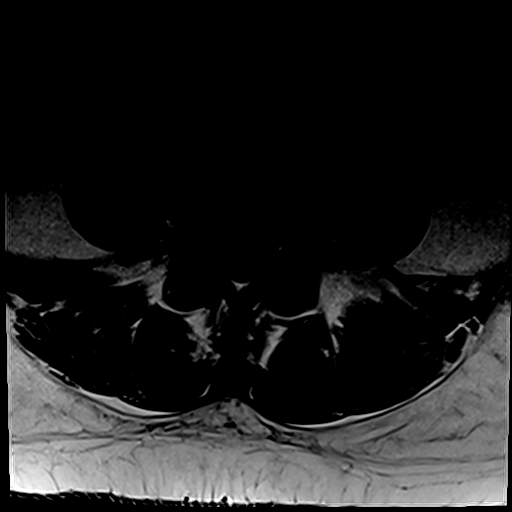
[im 16/34]
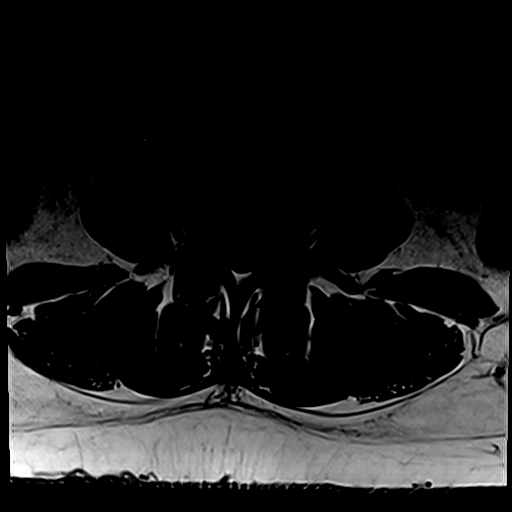
[im 18/34]
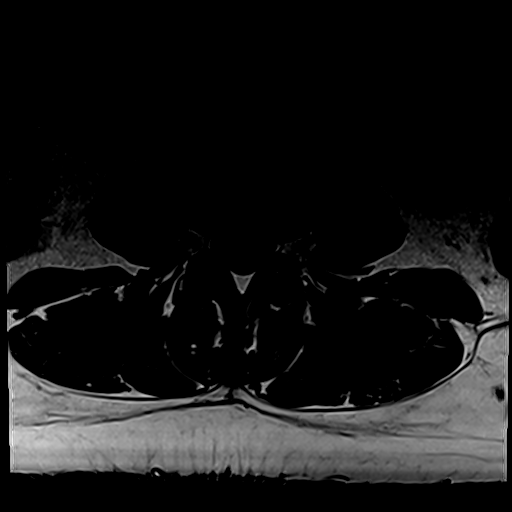
[im 23/34]
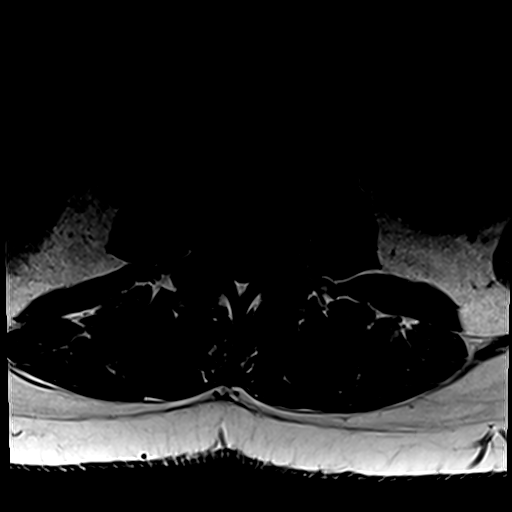
[im 28/34]
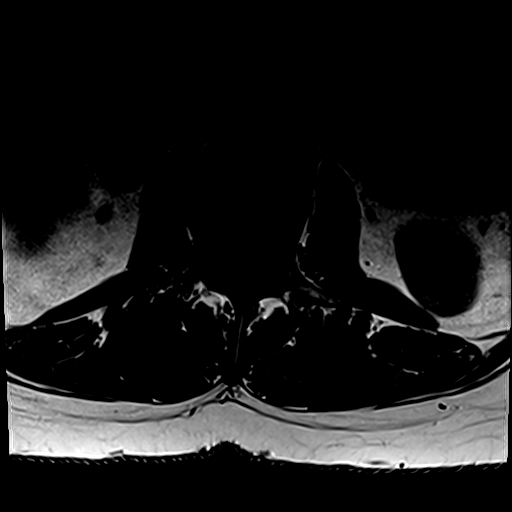
[im 34/34]
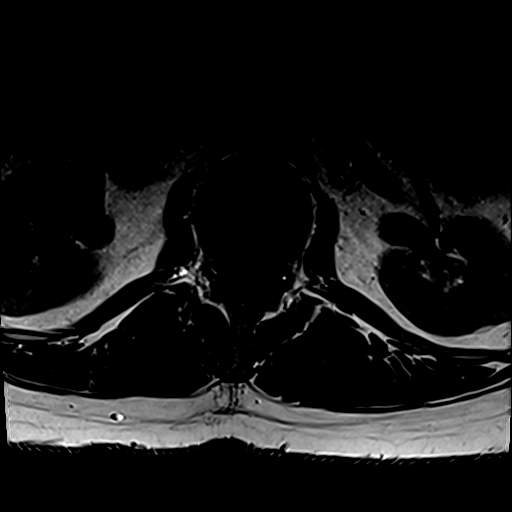

[31 of 48 positions shown; findings below may reference images not displayed]

FINDINGS: Segmentation: Standard. Lowest well-formed disc space labeled the
L5-S1 level.

Alignment: Physiologic with preservation of the normal lumbar
lordosis. No listhesis.

Vertebrae: Vertebral body height maintained without acute or chronic
fracture. Bone marrow signal intensity within normal limits.
Subcentimeter benign hemangioma noted within the L2 vertebral body.
No worrisome osseous lesions or abnormal marrow edema.

Conus medullaris and cauda equina: Conus extends to the L1-2 level.
Conus and cauda equina appear normal.

Paraspinal and other soft tissues: Paraspinous soft tissues within
normal limits. Few small subcentimeter benign appearing cyst noted
about the visualized kidneys, no follow-up imaging recommended.
Visualized visceral structures otherwise unremarkable.

Disc levels:

L1-2:  Unremarkable.

L2-3: Normal interspace. Mild facet hypertrophy. No canal or
foraminal stenosis.

L3-4: Mild disc bulge with disc desiccation. Mild bilateral facet
hypertrophy. No spinal stenosis. Foramina remain patent.

L4-5: Mild disc bulge with disc desiccation. Superimposed small
right foraminal to extraforaminal disc protrusion contacts the
exiting right L4 nerve root as it courses of the right neural
foramen (series 8, image 22). Mild bilateral facet hypertrophy.
Trace joint effusions. No spinal stenosis. Mild bilateral L4
foraminal stenosis.

L5-S1: Disc desiccation with mild disc bulge. Small central disc
protrusion minimally flattens the ventral thecal sac (series 8,
image 28). Mild bilateral facet hypertrophy. No significant canal or
lateral recess stenosis. Foramina remain patent.
IMPRESSION: 1. Small right foraminal to extraforaminal disc protrusion at L4-5,
contacting and potentially irritating the exiting right L4 nerve
root.
2. Small central disc protrusion at L5-S1 without significant
stenosis or neural impingement.
3. Mild noncompressive disc bulging at L3-4 without stenosis or
impingement.
4. Mild bilateral facet hypertrophy at L2-3 through L5-S1.

## 2023-09-20 ENCOUNTER — Telehealth (INDEPENDENT_AMBULATORY_CARE_PROVIDER_SITE_OTHER): Payer: Self-pay

## 2023-09-20 NOTE — Telephone Encounter (Signed)
 Patient has tried and failed Linzess and Amitiza, Miralax, dulcolax, but I saw no mention of her trying Trulance or Motegrity. Looks like they may want her to try Trulance. Thanks  Kaitlin [name] [address] Kait[name], [place] [address] 09/19/2023 Patient Name: Kaitlin [name], Kaitlin [name] Patient DOB: Sep 20, 1970 Patient ID: [id number] Request ID: [id number] Medication Name: Allena Napoleon 50 mg TABLET Reviewed by: Dear Doylene Bode, I have reviewed the request to cover Ibsrela 50 mg TABLET. The information submitted did not meet the criteria necessary to approve this medication. Based on the information provided, I am unable to approve coverage for this medication because: ? There is nothing to support that the individual has had failure, contraindication, or intolerance to both of the following covered alternatives: linaclotide (Linzess) and plecanatide (Trulance). ? Allena Napoleon is considered medically necessary when all of the following are met: 1. Age 52 years or older; 2. Documented diagnosis of irritable bowel syndrome with constipation (IBS-C); and 3. Documentation of failure, contraindication, or intolerance to both of the following: A. linaclotide (Linzess); and B. plecanatide (Trulance). Continuation of tenapanor Allena Napoleon) is considered medically necessary when the above medical necessity criteria are met and there is documentation of beneficial response. Any other use is considered not medically necessary. [employer] on behalf of your employer plan [employer] Cover[employer] Policy [phone] [name] was used to complete this review. I have reviewed information from your patient's benefit plan and any policies and guidelines needed to reach this decision. The information submitted did not meet the criteria necessary to approve this medication. Our Pharmacy & Therapeutics Committee, which consists of independent, actively practicing physicians and pharmacists not employed by [facility],  reviews clinical information about medications and establishes the criteria that determine what is and is not considered medically necessary under your patient's plan. If you, your patient, or their authorized representative feel this deserves further consideration, you may: ? Fax additional clinical information to [phone] ? Call [phone] to schedule a discussion regarding this determination with me or a clinical reviewer Your patient or their authorized representative may also appeal this decision. An explanation of the patient's appeal rights is enclosed. If the patient's plan is governed by ERISA, your patient or their authorized representative may have the right to bring legal action under section 502(a) of ERISA following our review. You, your patient, the hospital, or your patient's authorized representative can ask for free copies of the documents, guidelines or other information we used to make this decision. If your patient has any questions about pharmacy benefits coverage, please have your patient call the Customer Service number on their [employer] ID card. Sincerely, [employer]

## 2023-09-22 ENCOUNTER — Other Ambulatory Visit (INDEPENDENT_AMBULATORY_CARE_PROVIDER_SITE_OTHER): Payer: Self-pay | Admitting: Gastroenterology

## 2023-09-22 MED ORDER — LUBIPROSTONE 8 MCG PO CAPS
8.0000 ug | ORAL_CAPSULE | Freq: Two times a day (BID) | ORAL | 3 refills | Status: DC
Start: 2023-09-22 — End: 2024-02-21

## 2023-09-22 NOTE — Telephone Encounter (Signed)
 Patient says the IBSreala caused her to bloat and prefers not to take it. She would like for you to send in the Amitiza 8 mcg bid as this helped her in the past. She uses [facility], thanks  I spoke with the patient and made her aware per [facility], Do we have adequate samples of ibsrela? Trulance looks like it is going to be around $400 on her plan from what I can see, though this may not be her actual cost. We can discuss with her keeping her on samples (if we have enough) and she is liking ibsrela or we can try to send trulance and see how much this is.

## 2023-10-14 ENCOUNTER — Encounter (INDEPENDENT_AMBULATORY_CARE_PROVIDER_SITE_OTHER): Payer: Self-pay

## 2023-11-19 ENCOUNTER — Other Ambulatory Visit: Payer: Self-pay

## 2023-11-19 DIAGNOSIS — R5383 Other fatigue: Secondary | ICD-10-CM

## 2023-11-19 DIAGNOSIS — E785 Hyperlipidemia, unspecified: Secondary | ICD-10-CM

## 2023-11-19 DIAGNOSIS — R739 Hyperglycemia, unspecified: Secondary | ICD-10-CM

## 2023-11-19 DIAGNOSIS — D509 Iron deficiency anemia, unspecified: Secondary | ICD-10-CM

## 2023-11-29 ENCOUNTER — Inpatient Hospital Stay: Attending: Hematology

## 2023-11-29 ENCOUNTER — Encounter: Payer: Self-pay | Admitting: Hematology

## 2023-11-29 DIAGNOSIS — E559 Vitamin D deficiency, unspecified: Secondary | ICD-10-CM | POA: Insufficient documentation

## 2023-11-29 DIAGNOSIS — N920 Excessive and frequent menstruation with regular cycle: Secondary | ICD-10-CM | POA: Diagnosis present

## 2023-11-29 DIAGNOSIS — R739 Hyperglycemia, unspecified: Secondary | ICD-10-CM

## 2023-11-29 DIAGNOSIS — D509 Iron deficiency anemia, unspecified: Secondary | ICD-10-CM

## 2023-11-29 DIAGNOSIS — E785 Hyperlipidemia, unspecified: Secondary | ICD-10-CM

## 2023-11-29 DIAGNOSIS — R5383 Other fatigue: Secondary | ICD-10-CM

## 2023-11-29 DIAGNOSIS — D500 Iron deficiency anemia secondary to blood loss (chronic): Secondary | ICD-10-CM | POA: Insufficient documentation

## 2023-11-29 LAB — CBC WITH DIFFERENTIAL/PLATELET
Abs Immature Granulocytes: 0.01 10*3/uL (ref 0.00–0.07)
Basophils Absolute: 0 10*3/uL (ref 0.0–0.1)
Basophils Relative: 1 %
Eosinophils Absolute: 0.1 10*3/uL (ref 0.0–0.5)
Eosinophils Relative: 2 %
HCT: 37.6 % (ref 36.0–46.0)
Hemoglobin: 12.8 g/dL (ref 12.0–15.0)
Immature Granulocytes: 0 %
Lymphocytes Relative: 41 %
Lymphs Abs: 3.2 10*3/uL (ref 0.7–4.0)
MCH: 31.1 pg (ref 26.0–34.0)
MCHC: 34 g/dL (ref 30.0–36.0)
MCV: 91.3 fL (ref 80.0–100.0)
Monocytes Absolute: 0.5 10*3/uL (ref 0.1–1.0)
Monocytes Relative: 6 %
Neutro Abs: 3.9 10*3/uL (ref 1.7–7.7)
Neutrophils Relative %: 50 %
Platelets: 283 10*3/uL (ref 150–400)
RBC: 4.12 MIL/uL (ref 3.87–5.11)
RDW: 12.2 % (ref 11.5–15.5)
WBC: 7.7 10*3/uL (ref 4.0–10.5)
nRBC: 0 % (ref 0.0–0.2)

## 2023-11-29 LAB — HEMOGLOBIN A1C
Hgb A1c MFr Bld: 5 % (ref 4.8–5.6)
Mean Plasma Glucose: 96.8 mg/dL

## 2023-11-29 LAB — IRON AND TIBC
Iron: 73 ug/dL (ref 28–170)
Saturation Ratios: 25 % (ref 10.4–31.8)
TIBC: 296 ug/dL (ref 250–450)
UIBC: 223 ug/dL

## 2023-11-29 LAB — COMPREHENSIVE METABOLIC PANEL WITH GFR
ALT: 19 U/L (ref 0–44)
AST: 18 U/L (ref 15–41)
Albumin: 3.6 g/dL (ref 3.5–5.0)
Alkaline Phosphatase: 76 U/L (ref 38–126)
Anion gap: 9 (ref 5–15)
BUN: 14 mg/dL (ref 6–20)
CO2: 26 mmol/L (ref 22–32)
Calcium: 9.1 mg/dL (ref 8.9–10.3)
Chloride: 105 mmol/L (ref 98–111)
Creatinine, Ser: 0.79 mg/dL (ref 0.44–1.00)
GFR, Estimated: >60 mL/min (ref >60)
Glucose, Bld: 105 mg/dL — ABNORMAL HIGH (ref 70–99)
Potassium: 3.3 mmol/L — ABNORMAL LOW (ref 3.5–5.1)
Sodium: 140 mmol/L (ref 135–145)
Total Bilirubin: 0.4 mg/dL (ref 0.0–1.2)
Total Protein: 6.7 g/dL (ref 6.5–8.1)

## 2023-11-29 LAB — FERRITIN: Ferritin: 200 ng/mL (ref 11–307)

## 2023-11-29 LAB — LIPID PANEL
Cholesterol: 147 mg/dL (ref 0–200)
HDL: 49 mg/dL (ref >40)
LDL Cholesterol: 73 mg/dL (ref 0–99)
Total CHOL/HDL Ratio: 3 ratio
Triglycerides: 126 mg/dL (ref <150)
VLDL: 25 mg/dL (ref 0–40)

## 2023-11-29 LAB — TSH: TSH: 2.251 u[IU]/mL (ref 0.350–4.500)

## 2023-11-29 LAB — VITAMIN D 25 HYDROXY (VIT D DEFICIENCY, FRACTURES): Vit D, 25-Hydroxy: 56.44 ng/mL (ref 30–100)

## 2023-12-09 ENCOUNTER — Inpatient Hospital Stay: Payer: BC Managed Care – PPO

## 2023-12-16 ENCOUNTER — Ambulatory Visit: Payer: BC Managed Care – PPO | Admitting: Oncology

## 2023-12-20 ENCOUNTER — Other Ambulatory Visit: Payer: Self-pay | Admitting: Hematology

## 2023-12-20 DIAGNOSIS — E559 Vitamin D deficiency, unspecified: Secondary | ICD-10-CM

## 2023-12-23 ENCOUNTER — Inpatient Hospital Stay (HOSPITAL_BASED_OUTPATIENT_CLINIC_OR_DEPARTMENT_OTHER): Admitting: Oncology

## 2023-12-23 DIAGNOSIS — D509 Iron deficiency anemia, unspecified: Secondary | ICD-10-CM

## 2023-12-23 DIAGNOSIS — E559 Vitamin D deficiency, unspecified: Secondary | ICD-10-CM

## 2023-12-23 DIAGNOSIS — D500 Iron deficiency anemia secondary to blood loss (chronic): Secondary | ICD-10-CM | POA: Diagnosis not present

## 2023-12-23 NOTE — Progress Notes (Signed)
 [facility] [address], [place] [address]    Clinic Day:  01/04/2024  Referring physician: Lari Elspeth BRAVO, MD  Patient Care Team: Lari Elspeth BRAVO, MD as PCP - General (Family Medicine)   ASSESSMENT & PLAN:   Assessment: 1.  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 - Hemoccult stool negative x 3 in August 2020 - Cannot tolerate iron  pills because of severe constipation.  Plan: 1.  Iron  deficiency state:  - She is currently on Bijuva and did not have any vaginal bleeding since August 2023. - She reports having tiredness and also craving ice. - Labs from 11/29/23: Ferritin 200 and hemoglobin is 12.8 (12.6).  - No additional iron  needed as this time.  - RTC 6 months for follow-up with repeat labs.   2.  Vitamin D  deficiency - Vitamin D  level improved to 56.44 (36.28).   -Continue vitamin D  50,000 units every other week.    PLAN SUMMARY: >> No additional iron  needed.  >> Continue Vitamin D  every other week.  >> RTC in 6 months with labs and see NP.     Orders Placed This Encounter  Procedures   Vitamin D  25 hydroxy    Standing Status:   Future    Expected Date:   06/23/2024    Expiration Date:   12/22/2024   Ferritin    Standing Status:   Future    Expected Date:   06/23/2024    Expiration Date:   12/22/2024   Iron  and TIBC ([facility] ONLY)    Standing Status:   Future    Expected Date:   06/23/2024    Expiration Date:   12/22/2024   Comprehensive metabolic panel    Standing Status:   Future    Expected Date:   06/23/2024    Expiration Date:   12/22/2024   CBC with Differential    Standing Status:   Future    Expected Date:   06/23/2024    Expiration Date:   12/22/2024    Delon BRAVO Hope, NP   7/8/20252:59 PM  CHIEF COMPLAINT:   Diagnosis: iron   deficiency anemia    Cancer Staging  No matching staging information was found for the patient.   Prior Therapy: none  Current Therapy:  Intermittent IV iron     HISTORY OF PRESENT ILLNESS:   Oncology History   No history exists.     INTERVAL HISTORY:   Kaitlin [name] is a 53 y.o. female presenting to clinic today for follow up of iron  deficiency anemia. She was last seen by Dr. Noralee on 06/10/23.    She last received IV iron  on 06/18/2023.  Tolerated infusion well.  She is taking 50,000 units of vitamin D  weekly and Bijuva as prescribed. She denies any vaginal bleeding, BRBPR, or hematochezia.  Reports feeling okay.  Appetite is 100% energy levels are 75%. Denies any pain.   PAST MEDICAL HISTORY:   Past Medical History: Past Medical History:  Diagnosis Date   DDD (degenerative disc disease), lumbosacral    Family history of adverse reaction to anesthesia    father and brother--- ponv   GERD (gastroesophageal reflux disease)    Hemorrhoids    History of kidney stones    History of low potassium    per pt 11/ 2014 ED visit @[facility]  with right arm pain, ekg  normal , but had to have IV potassium, no issues since   Hypertension    followed by pcp   (09-13-2019  per pt never had stress test)   Iron  deficiency anemia secondary to blood loss (chronic)    followed by Dr Rogers (hematology)    d/t uterine blood loss;  treated with intermittant iron  infusions   Irritable bowel syndrome with constipation    Microcytic anemia    PMB (postmenopausal bleeding)    PONV (postoperative nausea and vomiting)    Rosacea    Sciatica, right side     Surgical History: Past Surgical History:  Procedure Laterality Date   ANTERIOR CERVICAL DECOMP/DISCECTOMY FUSION N/A 06/12/2013   Procedure: ANTERIOR CERVICAL DECOMPRESSION/DISCECTOMY FUSION 1 LEVEL;  Surgeon: Victory Gens, MD;  Location: [facility];  Service: Neurosurgery;  Laterality: N/A;  C5-6 Anterior cervical  decompression/diskectomy/fusion   APPENDECTOMY  06/23/2011   BREAST BIOPSY Right 2014   per pt benign   COLONOSCOPY W/ POLYPECTOMY  2012   COLONOSCOPY WITH PROPOFOL  N/A 10/16/2022   Procedure: COLONOSCOPY WITH PROPOFOL ;  Surgeon: Eartha Angelia Sieving, MD;  Location: [facility];  Service: Gastroenterology;  Laterality: N/A;  11:30am; ASA 2   DIAGNOSTIC LAPAROSCOPY  2002   DILATATION & CURETTAGE/HYSTEROSCOPY WITH MYOSURE N/A 02/09/2022   Procedure: DILATATION & CURETTAGE/HYSTEROSCOPY WITH MYOSURE;  Surgeon: Gorge Ade, MD;  Location: [facility];  Service: Gynecology;  Laterality: N/A;   DILITATION & CURRETTAGE/HYSTROSCOPY WITH NOVASURE ABLATION N/A 09/19/2019   Procedure: DILATATION & CURETTAGE/HYSTEROSCOPY;  Surgeon: Gorge Ade, MD;  Location: [facility];  Service: Gynecology;  Laterality: N/A;   ESOPHAGOGASTRODUODENOSCOPY (EGD) WITH PROPOFOL  N/A 10/16/2022   Procedure: ESOPHAGOGASTRODUODENOSCOPY (EGD) WITH PROPOFOL ;  Surgeon: Eartha Angelia Sieving, MD;  [facility]: [facility];  Service: Gastroenterology;  Laterality: N/A;  11:30am:ASA 2   FLEXIBLE SIGMOIDOSCOPY N/A 04/27/2018   Procedure: FLEXIBLE SIGMOIDOSCOPY;  Surgeon: Golda Claudis PENNER, MD;  [facility]: [facility];  Service: Endoscopy;  Laterality: N/A;  8:30   FOOT SURGERY Right 2005   Bunionectomy   POLYPECTOMY  10/16/2022   Procedure: POLYPECTOMY;  Surgeon: Eartha Angelia, Sieving, MD;  [facility]: [facility];  Service: Gastroenterology;;   ROBOTIC ASSISTED LAPAROSCOPIC LYSIS OF ADHESION N/A 09/19/2019   Procedure: XI ROBOTIC ASSISTED LAPAROSCOPIC LYSIS OF ADHESION/Ablation of Endometriosis;  Surgeon: Gorge Ade, MD;  Location: [facility];  Service: Gynecology;  Laterality: N/A;   SAVORY DILATION  10/16/2022   Procedure: SAVORY DILATION;  Surgeon: Eartha Angelia, Sieving, MD;  [facility];  Service: Gastroenterology;;    Social History: Social  History   Socioeconomic History   Marital status: Married    Spouse name: Not on file   Number of children: Not on file   Years of education: Not on file   Highest education level: Not on file  Occupational History   Not on file  Tobacco Use   Smoking status: Never   Smokeless tobacco: Never  Vaping Use   Vaping status: Never Used  Substance and Sexual Activity   Alcohol use: No   Drug use: Never   Sexual activity: Not on file  Other Topics Concern   Not on file  Social History Narrative   Not on file   Social Drivers of Health   Financial Resource Strain: Not on file  Food Insecurity: Not on file  Transportation Needs: Not on file  Physical Activity: Not on file  Stress: Not on file  Social Connections: Not  on file  Intimate Partner Violence: Not on file    Family History: Family History  Problem Relation Age of Onset   Prostate cancer Father    Hypertension Brother     Current Medications:  Current Outpatient Medications:    amoxicillin-clavulanate (AUGMENTIN) 875-125 MG tablet, Take 1 tablet by mouth 2 (two) times daily., Disp: , Rfl:    b complex vitamins capsule, Take 1 capsule by mouth daily., Disp: , Rfl:    cetirizine (ZYRTEC) 10 MG tablet, Take 10 mg by mouth every evening., Disp: , Rfl:    CRANBERRY PO, Take 1,000 mg by mouth at bedtime as needed (UTI Symptoms)., Disp: , Rfl:    cyclobenzaprine (FLEXERIL) 10 MG tablet, Take 10 mg by mouth 3 (three) times daily as needed for muscle spasms., Disp: , Rfl:    Estradiol-Progesterone (BIJUVA) 1-100 MG CAPS, Take 1 capsule by mouth at bedtime., Disp: , Rfl:    Famotidine  (PEPCID  PO), Take by mouth. Takes 10mg  otc as needed., Disp: , Rfl:    fluticasone  (FLONASE ) 50 MCG/ACT nasal spray, Place 2 sprays into both nostrils daily. , Disp: , Rfl:    furosemide (LASIX) 40 MG tablet, Take 40 mg by mouth daily. , Disp: , Rfl:    gabapentin (NEURONTIN) 300 MG capsule, Take 300 mg by mouth at bedtime., Disp: , Rfl:     ibuprofen (ADVIL) 200 MG tablet, Take 800 mg by mouth at bedtime., Disp: , Rfl:    lubiprostone  (AMITIZA ) 8 MCG capsule, Take 1 capsule (8 mcg total) by mouth 2 (two) times daily with a meal., Disp: 60 capsule, Rfl: 3   metFORMIN (GLUCOPHAGE-XR) 500 MG 24 hr tablet, Take 500 mg by mouth daily., Disp: , Rfl:    pantoprazole  (PROTONIX ) 40 MG tablet, Take 1 tablet (40 mg total) by mouth daily., Disp: 90 tablet, Rfl: 3   phentermine (ADIPEX-P) 37.5 MG tablet, Take 37.5 mg by mouth every morning., Disp: , Rfl:    potassium chloride  (KLOR-CON  M) 10 MEQ tablet, Take 20 mEq by mouth daily., Disp: , Rfl:    rosuvastatin (CRESTOR) 5 MG tablet, Take 5 mg by mouth at bedtime., Disp: , Rfl:    Tenapanor HCl (IBSRELA ) 50 MG TABS, Take one bid, Disp: 180 tablet, Rfl: 3   valACYclovir (VALTREX) 1000 MG tablet, Take 4,000 mg by mouth once., Disp: , Rfl:    Vitamin D , Ergocalciferol , (DRISDOL ) 1.25 MG (50000 UNIT) CAPS capsule, TAKE ONE CAPSULE BY MOUTH WEEKLY, Disp: 12 capsule, Rfl: 3   Allergies: Allergies  Allergen Reactions   Erythromycin Itching   Hydrocodone Itching   Other Hives, Itching, Swelling and Rash    Surgical glue---- Dermabond     REVIEW OF SYSTEMS:   Review of Systems  Constitutional:  Negative for fatigue.  Cardiovascular:  Positive for palpitations.  Neurological:  Positive for dizziness.     VITALS:   Blood pressure 131/76, pulse 87, temperature 98.1 F (36.7 C), temperature source Oral, resp. rate 18, weight 224 lb 3.3 oz (101.7 kg), last menstrual period 03/10/2021, SpO2 100%.  Wt Readings from Last 3 Encounters:  12/23/23 224 lb 3.3 oz (101.7 kg)  06/10/23 235 lb 14.3 oz (107 kg)  03/29/23 226 lb 6.4 oz (102.7 kg)    Body mass index is 36.19 kg/m.  Performance status (ECOG): 0 - Asymptomatic  PHYSICAL EXAM:   Physical Exam Constitutional:      Appearance: Normal appearance.  Cardiovascular:     Rate and Rhythm: Normal rate and regular  rhythm.  Pulmonary:      Effort: Pulmonary effort is normal.     Breath sounds: Normal breath sounds.  Abdominal:     General: Bowel sounds are normal.     Palpations: Abdomen is soft.  Musculoskeletal:        General: No swelling. Normal range of motion.  Neurological:     Mental Status: She is alert and oriented to person, place, and time. Mental status is at baseline.     LABS:      Latest Ref Rng & Units 11/29/2023    7:54 AM 06/04/2023    7:48 AM 11/19/2022    3:16 PM  CBC  WBC 4.0 - 10.5 K/uL 7.7  7.4  6.8   Hemoglobin 12.0 - 15.0 g/dL 87.1  87.3  86.4   Hematocrit 36.0 - 46.0 % 37.6  37.1  39.9   Platelets 150 - 400 K/uL 283  297  299       Latest Ref Rng & Units 11/29/2023    7:54 AM 06/04/2023    7:48 AM 11/19/2022    3:16 PM  CMP  Glucose 70 - 99 mg/dL 894  890  99   BUN 6 - 20 mg/dL 14  17  16    Creatinine 0.44 - 1.00 mg/dL 9.20  9.17  9.19   Sodium 135 - 145 mmol/L 140  139  139   Potassium 3.5 - 5.1 mmol/L 3.3  3.8  3.6   Chloride 98 - 111 mmol/L 105  104  101   CO2 22 - 32 mmol/L 26  26  28    Calcium 8.9 - 10.3 mg/dL 9.1  8.9  8.5   Total Protein 6.5 - 8.1 g/dL 6.7  6.8  7.4   Total Bilirubin 0.0 - 1.2 mg/dL 0.4  0.4  0.7   Alkaline Phos 38 - 126 U/L 76  70  70   AST 15 - 41 U/L 18  16  15    ALT 0 - 44 U/L 19  18  15       No results found for: CEA1, CEA / No results found for: CEA1, CEA No results found for: PSA1 No results found for: CAN199 No results found for: RJW874  Lab Results  Component Value Date   TOTALPROTELP 7.1 02/13/2019   ALBUMINELP 4.0 02/13/2019   A1GS 0.3 02/13/2019   A2GS 0.8 02/13/2019   BETS 1.0 02/13/2019   GAMS 1.1 02/13/2019   MSPIKE Not Observed 02/13/2019   SPEI Comment 02/13/2019   Lab Results  Component Value Date   TIBC 296 11/29/2023   TIBC 318 06/04/2023   TIBC 341 11/19/2022   FERRITIN 200 11/29/2023   FERRITIN 110 06/04/2023   FERRITIN 139 11/19/2022   IRONPCTSAT 25 11/29/2023   IRONPCTSAT 21 06/04/2023   IRONPCTSAT 19  11/19/2022   Lab Results  Component Value Date   LDH 142 10/01/2021   LDH 148 03/27/2021   LDH 159 09/17/2020     STUDIES:   No results found.

## 2024-02-21 ENCOUNTER — Other Ambulatory Visit (INDEPENDENT_AMBULATORY_CARE_PROVIDER_SITE_OTHER): Payer: Self-pay | Admitting: Gastroenterology

## 2024-03-28 ENCOUNTER — Ambulatory Visit (INDEPENDENT_AMBULATORY_CARE_PROVIDER_SITE_OTHER): Payer: BC Managed Care – PPO | Admitting: Gastroenterology

## 2024-03-28 VITALS — BP 125/86 | HR 82 | Temp 98.3°F | Ht 66.0 in | Wt 232.4 lb

## 2024-03-28 DIAGNOSIS — K219 Gastro-esophageal reflux disease without esophagitis: Secondary | ICD-10-CM

## 2024-03-28 DIAGNOSIS — R1011 Right upper quadrant pain: Secondary | ICD-10-CM | POA: Diagnosis not present

## 2024-03-28 DIAGNOSIS — K581 Irritable bowel syndrome with constipation: Secondary | ICD-10-CM | POA: Diagnosis not present

## 2024-03-28 DIAGNOSIS — R142 Eructation: Secondary | ICD-10-CM

## 2024-03-28 MED ORDER — PANTOPRAZOLE SODIUM 40 MG PO TBEC: 40.0000 mg | DELAYED_RELEASE_TABLET | Freq: Two times a day (BID) | ORAL | 3 refills | Status: DC

## 2024-03-28 NOTE — Patient Instructions (Signed)
-  take amitiza  8mcg twice daily  -good reflux precautions -increase protonix  40mg  to twice daily -continue famotidine  -Increase water  intake, aim for atleast 64 oz per day -Increase fruits, veggies and whole grains, kiwi and prunes are especially good for constipation -be mindful of greasy, spicy foods, carbonated drinks/caffeine  -let me know how symptoms are doing in the next 2 weeks, we will get imaging of the gallbladder if symptoms are not improving  Follow up 2 months  It was a pleasure to see you today. I want to create trusting relationships with patients and provide genuine, compassionate, and quality care. I truly value your feedback! please be on the lookout for a survey regarding your visit with me today. I appreciate your input about our visit and your time in completing this!    Denene Alamillo L. Dayton Sherr, MSN, APRN, AGNP-C Adult-Gerontology Nurse Practitioner [facility]

## 2024-03-28 NOTE — Progress Notes (Addendum)
 Referring Provider: Lari Elspeth BRAVO, MD Primary Care Physician:  Lari Elspeth BRAVO, MD Primary GI Physician: Dr. Eartha   Chief Complaint  Patient presents with   Follow-up    Reflux and IBS. Pt is having break through belching. Having hiccups a lot more. Pt says not sure what is going on.   HPI:   Kaitlin [name] is a 53 y.o. female with past medical history of GERD, Hemorrhoids, IBS, IDA, HTN, rosacea.   Patient presenting today for:  Follow up of GERD,belching, RUQ pain and IBS-C  Last seen September 2024, at that time using ibsrela , having more gurgling in her stomach, having a BM daily. Some rectal pain and blood in stool x1, more constipation at that time as well. GERD well managed on protonix  40mg  daily, famoitidine PRN. Green stools recently with some RUQ Pain, previous US  and HIDA scan normal  Recommended continue ibrela 50mg  BID, protonix  40mg  daily, famotidine  PRN, consider ph impedence/manometry, pt to make me aware of recurrent RUQ pain  Present:  Patient reports she has been having more belching and hiccups lately. She notes hiccups almost once a day. She feels she is belching very often. She denies acid regurgitation. She has had some heartburn. She notes that she missed one day of her protonix  when she went out of town but did take a prilosec and has felt like things have been off since then. She has had some intermittent nausea but no vomiting. She does endorse some RUQ pain at times. Has been taking famotidine  nightly recently. Does note significant nausea Sunday night after eating a cheeseburger.    She reports that she felt very bloated on Ibsrela . She is currently on amitiza  , she notes she only takes this BID sometimes as she did not realize it was supposed to be taken BID every day.  She is generally having a BM most days but will have to sit and strain and reposition at times, does better on the days she dose BID dosing. No rectal bleeding or melena.  Appetite and weight are stable     EGD: 09/2022- No endoscopic esophageal abnormality to explain                            patient's dysphagia. Esophagus dilated. Dilated.                           - 1 cm hiatal hernia.                           - Normal stomach.                           - Normal examined duodenum.                           - No specimens collected. Colonoscopy: 09/2022- One 1 mm polyp in the ascending colon, removed                            with a cold biopsy forceps. Resected and retrieved.                           - Anal papilla(e) were hypertrophied. (1 SSL)  Repeat Colonoscopy in 5 years      Past Medical History:  Diagnosis Date   DDD (degenerative disc disease), lumbosacral    Family history of adverse reaction to anesthesia    father and brother--- ponv   GERD (gastroesophageal reflux disease)    Hemorrhoids    History of kidney stones    History of low potassium    per pt 11/ 2014 ED visit @[facility]  with right arm pain, ekg normal , but had to have IV potassium, no issues since   Hypertension    followed by pcp   (09-13-2019  per pt never had stress test)   Iron  deficiency anemia secondary to blood loss (chronic)    followed by Dr Rogers (hematology)    d/t uterine blood loss;  treated with intermittant iron  infusions   Irritable bowel syndrome with constipation    Microcytic anemia    PMB (postmenopausal bleeding)    PONV (postoperative nausea and vomiting)    Rosacea    Sciatica, right side     Past Surgical History:  Procedure Laterality Date   ANTERIOR CERVICAL DECOMP/DISCECTOMY FUSION N/A 06/12/2013   Procedure: ANTERIOR CERVICAL DECOMPRESSION/DISCECTOMY FUSION 1 LEVEL;  Surgeon: Victory Gens, MD;  Location: [facility];  Service: Neurosurgery;  Laterality: N/A;  C5-6 Anterior cervical decompression/diskectomy/fusion   APPENDECTOMY  06/23/2011   BREAST BIOPSY Right 2014   per pt benign   COLONOSCOPY W/ POLYPECTOMY  2012    COLONOSCOPY WITH PROPOFOL  N/A 10/16/2022   Procedure: COLONOSCOPY WITH PROPOFOL ;  Surgeon: Eartha Angelia Sieving, MD;  Location: [facility];  Service: Gastroenterology;  Laterality: N/A;  11:30am; ASA 2   DIAGNOSTIC LAPAROSCOPY  2002   DILATATION & CURETTAGE/HYSTEROSCOPY WITH MYOSURE N/A 02/09/2022   Procedure: DILATATION & CURETTAGE/HYSTEROSCOPY WITH MYOSURE;  Surgeon: Gorge Ade, MD;  Location: [facility];  Service: Gynecology;  Laterality: N/A;   DILITATION & CURRETTAGE/HYSTROSCOPY WITH NOVASURE ABLATION N/A 09/19/2019   Procedure: DILATATION & CURETTAGE/HYSTEROSCOPY;  Surgeon: Gorge Ade, MD;  Location: [facility];  Service: Gynecology;  Laterality: N/A;   ESOPHAGOGASTRODUODENOSCOPY (EGD) WITH PROPOFOL  N/A 10/16/2022   Procedure: ESOPHAGOGASTRODUODENOSCOPY (EGD) WITH PROPOFOL ;  Surgeon: Eartha Angelia Sieving, MD;  Location: [facility];  Service: Gastroenterology;  Laterality: N/A;  11:30am:ASA 2   FLEXIBLE SIGMOIDOSCOPY N/A 04/27/2018   Procedure: FLEXIBLE SIGMOIDOSCOPY;  Surgeon: Golda Claudis PENNER, MD;  Location: [facility];  Service: Endoscopy;  Laterality: N/A;  8:30   FOOT SURGERY Right 2005   Bunionectomy   POLYPECTOMY  10/16/2022   Procedure: POLYPECTOMY;  Surgeon: Eartha Angelia, Sieving, MD;  Location: [facility];  Service: Gastroenterology;;   ROBOTIC ASSISTED LAPAROSCOPIC LYSIS OF ADHESION N/A 09/19/2019   Procedure: XI ROBOTIC ASSISTED LAPAROSCOPIC LYSIS OF ADHESION/Ablation of Endometriosis;  Surgeon: Gorge Ade, MD;  Location[facility];  Service: Gynecology;  Laterality: N/A;   SAVORY DILATION  10/16/2022   Procedure: SAVORY DILATION;  Surgeon: Eartha Angelia, Sieving, MD;  Location: [facility];  Service: Gastroenterology;;    Current Outpatient Medications  Medication Sig Dispense Refill   b complex vitamins capsule Take 1 capsule by mouth daily.     Calcium Carbonate Antacid (CALCIUM  CARBONATE PO) Take 2 tablets by mouth daily.     cetirizine (ZYRTEC) 10 MG tablet Take 10 mg by mouth every evening.     CRANBERRY PO Take 1,000 mg by mouth at bedtime as needed (UTI Symptoms).     cyclobenzaprine (FLEXERIL) 10 MG tablet Take  10 mg by mouth 3 (three) times daily as needed for muscle spasms.     Estradiol-Progesterone (BIJUVA) 1-100 MG CAPS Take 1 capsule by mouth at bedtime.     Famotidine  (PEPCID  PO) Take by mouth. Takes 10mg  otc as needed.     fluticasone  (FLONASE ) 50 MCG/ACT nasal spray Place 2 sprays into both nostrils daily.      furosemide (LASIX) 40 MG tablet Take 40 mg by mouth daily.      gabapentin (NEURONTIN) 300 MG capsule Take 300 mg by mouth at bedtime.     ibuprofen (ADVIL) 200 MG tablet Take 800 mg by mouth at bedtime.     lubiprostone  (AMITIZA ) 8 MCG capsule TAKE ONE CAPSULE BY MOUTH TWICE DAILY WITH MEALS 60 capsule 3   metFORMIN (GLUCOPHAGE-XR) 500 MG 24 hr tablet Take 500 mg by mouth daily.     pantoprazole  (PROTONIX ) 40 MG tablet Take 1 tablet (40 mg total) by mouth daily. 90 tablet 3   potassium chloride  (KLOR-CON  M) 10 MEQ tablet Take 20 mEq by mouth daily.     valACYclovir (VALTREX) 1000 MG tablet Take 4,000 mg by mouth once.     Vitamin D , Ergocalciferol , (DRISDOL ) 1.25 MG (50000 UNIT) CAPS capsule TAKE ONE CAPSULE BY MOUTH WEEKLY 12 capsule 3   amoxicillin-clavulanate (AUGMENTIN) 875-125 MG tablet Take 1 tablet by mouth 2 (two) times daily. (Patient not taking: Reported on 03/28/2024)     phentermine (ADIPEX-P) 37.5 MG tablet Take 37.5 mg by mouth every morning. (Patient not taking: Reported on 03/28/2024)     rosuvastatin (CRESTOR) 5 MG tablet Take 5 mg by mouth at bedtime. (Patient not taking: Reported on 03/28/2024)     Tenapanor HCl (IBSRELA ) 50 MG TABS Take one bid (Patient not taking: Reported on 03/28/2024) 180 tablet 3   No current facility-administered medications for this visit.    Allergies as of 03/28/2024 - Review Complete 03/28/2024   Allergen Reaction Noted   Erythromycin Itching 06/09/2013   Hydrocodone Itching 05/15/2011   Other Hives, Itching, Swelling, and Rash 05/15/2011    Social History   Socioeconomic History   Marital status: Married    Spouse name: Not on file   Number of children: Not on file   Years of education: Not on file   Highest education level: Not on file  Occupational History   Not on file  Tobacco Use   Smoking status: Never   Smokeless tobacco: Never  Vaping Use   Vaping status: Never Used  Substance and Sexual Activity   Alcohol use: No   Drug use: Never   Sexual activity: Not on file  Other Topics Concern   Not on file  Social History Narrative   Not on file   Social Drivers of Health   Financial Resource Strain: Not on file  Food Insecurity: Not on file  Transportation Needs: Not on file  Physical Activity: Not on file  Stress: Not on file  Social Connections: Not on file    Review of systems General: negative for malaise, night sweats, fever, chills, weight loss Neck: Negative for lumps, goiter, pain and significant neck swelling Resp: Negative for cough, wheezing, dyspnea at rest CV: Negative for chest pain, leg swelling, palpitations, orthopnea GI: denies melena, hematochezia, vomiting, diarrhea, constipation, dysphagia, odyonophagia, early satiety or unintentional weight loss. +belching +hiccups +nausea +intermittent RUQ pain MSK: Negative for joint pain or swelling, back pain, and muscle pain. Derm: Negative for itching or rash Psych: Denies depression, anxiety, memory loss, confusion.  No homicidal or suicidal ideation.  Heme: Negative for prolonged bleeding, bruising easily, and swollen nodes. Endocrine: Negative for cold or heat intolerance, polyuria, polydipsia and goiter. Neuro: negative for tremor, gait imbalance, syncope and seizures. The remainder of the review of systems is noncontributory.  Physical Exam: BP 125/86 (BP Location: Left Arm, Patient  Position: Sitting, Cuff Size: Large)   Pulse 82   Temp 98.3 F (36.8 C) (Oral)   Ht 5' 6 (1.676 m)   Wt 232 lb 6.4 oz (105.4 kg)   LMP 03/10/2021   BMI 37.51 kg/m  General:   Alert and oriented. No distress noted. Pleasant and cooperative.  Head:  Normocephalic and atraumatic. Eyes:  Conjuctiva clear without scleral icterus. Mouth:  Oral mucosa pink and moist. Good dentition. No lesions. Heart: Normal rate and rhythm, s1 and s2 heart sounds present.  Lungs: Clear lung sounds in all lobes. Respirations equal and unlabored. Abdomen:  +BS, soft, and non-distended. TTP of RUQ.  No rebound or guarding. No HSM or masses noted. Derm: No palmar erythema or jaundice Msk:  Symmetrical without gross deformities. Normal posture. Extremities:  Without edema. Neurologic:  Alert and  oriented x4 Psych:  Alert and cooperative. Normal mood and affect.  Invalid input(s): 6 MONTHS   ASSESSMENT: Kaitlin [name] is a 53 y.o. female presenting today for follow up of GERD,belching, RUQ pain and IBS  GERD/belching: currently on protonix  40mg  daily and famotidine  10mg  at bedtime, noting more hiccups and belching recently after missing a dose of her PPI and taking prilosec instead. No real regurgitation but has had some heartburn. Noting significant nausea Sunday night after consuming a burger. She endorses intermittent RUQ discomfort though only about 3-4 episodes in the last 6 months. Unclear at this time if this is truly uncontrolled GERD or if her gallbladder could be contributing as she does have some RUQ TTP. At this time, I would recommend increasing PPI to BID dosing for the next two weeks to see if symptoms improve. If she continues to have belching/nausea, would recommend RUQ US  and possible HIDA scan to further evaluate her gallbladder.   IBS: doing okay on amitiza , though was not clear that she was to take this twice a day everyday. She notes easier BMs on days she does take a second dose. She  did not tolerate higher dose of amitiza  in the past as it gave her large volume diarrhea. We discussed that amitiza  is to be dosed BID for best results.    PLAN:  -take amitiza  8mcg BID -good reflux precautions -increase protonix  40mg  to BID -continue famotidine  at bedtime  -Increase water  intake, aim for atleast 64 oz per day -Increase fruits, veggies and whole grains, kiwi and prunes are especially good for constipation -pt to call with report on symptoms in 2 weeks, consider RUQ US /possible HIDA  if symptoms not improving with increased PPI dosing.  All questions were answered, patient verbalized understanding and is in agreement with plan as outlined above.  Follow Up: 2 months   Almer Littleton L. Mariette, MSN, APRN, AGNP-C Adult-Gerontology Nurse Practitioner [facility]  I have reviewed the note and agree with the APP's assessment as described in this progress note  Toribio Fortune, MD Gastroenterology and Hepatology [facility]

## 2024-04-10 ENCOUNTER — Telehealth (INDEPENDENT_AMBULATORY_CARE_PROVIDER_SITE_OTHER): Payer: Self-pay

## 2024-04-10 NOTE — Telephone Encounter (Signed)
 ALMARIE DRIER (KeyBETHA FUS) Rx #: [id number] Need Help? Call us  at [phone] Outcome Additional Information Required Drug is covered by current benefit plan. No further PA activity needed Drug Pantoprazole  Sodium 40MG  dr tablets ePA cloud Pension scheme manager PA Form [phone] NCPDP)

## 2024-04-12 ENCOUNTER — Encounter (INDEPENDENT_AMBULATORY_CARE_PROVIDER_SITE_OTHER): Payer: Self-pay | Admitting: Gastroenterology

## 2024-04-17 NOTE — Telephone Encounter (Signed)
 Rosina with [facility] called saying the patient medication is showing still needing a PA, even though the Cover My meds sight stated there was no PA needed. [employer] phone # [phone].

## 2024-04-18 NOTE — Telephone Encounter (Signed)
 I have called and left a Vm on the patient's phone, I asked that she please return call to the office so, I can make her aware she is now able to fill the bid dosing.   I called [employer] at [phone] spoke with Sarah Bush [facility] A whom states there was a quantity limit on the pantoprazole . We initiated a PA for the BID dosing, which was approved from 04/18/2024-04/18/2025. Case ID # [id number]. She will send out approval to both the pharmacy and the physicians office. I have called [facility] and spoke with Dempsey whom states he will fill the script for # 60 tablets.

## 2024-04-19 NOTE — Telephone Encounter (Signed)
 I sent a My Chart to the patient stating I spoke with [employer] and have gotten an approval, which will be scanned into the computer. I have also faxed the approval letter to [facility] Whitney Lerner.

## 2024-04-19 NOTE — Telephone Encounter (Signed)
 This message was sent via FAXCOM, a product from [employer]. [email or web address].biscom.com/                    -------Fax Transmission Report-------  To:               Recipient at [phone] Subject:          E. Aldana Pantoprazole  bid approval letter, WhitneyBETHA Lerner Result:           The transmission was successful. Explanation:      All Pages Ok Pages Sent:       3 Connect Time:     1 minutes, 15 seconds Transmit Time:    04/19/2024 07:50 Transfer Rate:    14400 Status Code:      0000 Retry Count:      0 Job Id:           [id number] Unique Id:        [id number] Fax Line:         36 Fax Server:       [id number]

## 2024-05-04 ENCOUNTER — Encounter: Payer: Self-pay | Admitting: Oncology

## 2024-05-22 ENCOUNTER — Ambulatory Visit (INDEPENDENT_AMBULATORY_CARE_PROVIDER_SITE_OTHER): Admitting: Gastroenterology

## 2024-05-22 ENCOUNTER — Encounter (INDEPENDENT_AMBULATORY_CARE_PROVIDER_SITE_OTHER): Payer: Self-pay | Admitting: Gastroenterology

## 2024-05-22 ENCOUNTER — Encounter: Payer: Self-pay | Admitting: Oncology

## 2024-05-22 ENCOUNTER — Telehealth (INDEPENDENT_AMBULATORY_CARE_PROVIDER_SITE_OTHER): Payer: Self-pay

## 2024-05-22 VITALS — BP 137/84 | HR 92 | Temp 98.1°F | Ht 66.0 in | Wt 230.6 lb

## 2024-05-22 DIAGNOSIS — R1319 Other dysphagia: Secondary | ICD-10-CM | POA: Diagnosis not present

## 2024-05-22 DIAGNOSIS — K219 Gastro-esophageal reflux disease without esophagitis: Secondary | ICD-10-CM | POA: Diagnosis not present

## 2024-05-22 DIAGNOSIS — K581 Irritable bowel syndrome with constipation: Secondary | ICD-10-CM

## 2024-05-22 DIAGNOSIS — R142 Eructation: Secondary | ICD-10-CM | POA: Diagnosis not present

## 2024-05-22 DIAGNOSIS — R1011 Right upper quadrant pain: Secondary | ICD-10-CM

## 2024-05-22 MED ORDER — DEXLANSOPRAZOLE 60 MG PO CPDR: 60.0000 mg | DELAYED_RELEASE_CAPSULE | Freq: Every day | ORAL | 1 refills | Status: DC

## 2024-05-22 NOTE — Telephone Encounter (Addendum)
 PA on [employer] for EGD/DIL: Your case has been sent to clinical review. You will be notified via fax within 2 business days if additional clinical information is needed. If you wish to speak with [employer] at anytime, please call [phone]. The prior authorization you submitted, Case [id number], has been received. Additional case status notifications will be sent if you opted in for email notifications. Thank you.  Authorization Number: [id number]

## 2024-05-22 NOTE — Progress Notes (Addendum)
 Referring Provider: Lari Elspeth BRAVO, MD Primary Care Physician:  Lari Elspeth BRAVO, MD Primary GI Physician: Dr. Eartha   Chief Complaint  Patient presents with   Chronic GERD    Pt arrives for follow up. Reports having issues with belching and some abdominal pain (middle area of abdomen under rib cage). Some right sided pain occasionally. Some nausea after eating, not many episode. Coughing a lot in the mornings to clear throat, has a lot of drainage.    HPI:   Kaitlin [name] is a 53 y.o. female with past medical history of GERD, Hemorrhoids, IBS, IDA, HTN, rosacea.    Patient presenting today for:  Follow up of GERD, belching, epigastric pain, dysphagia, IBS-C  Last seen September, at that time having more belching, hiccups, some heartburn, intermittent nausea, RUQ Pain, taking famotidine  at night recently. Feeling bloated on ibsrela , currently on amitiza  but not BID everyday, straining occasionally   Recommended to take amitiza  BID, good reflux precautions, increase PPI to BID, continue famotidine  at bedtime, good water  intake, high fiber diet, pt to call with report on symptoms in 2 weeks, consider RUQ US /Possible HIDA scan if symptoms not improving with PPI therapy  Present: States she is taking Protonix  BID and taking pepcid  at night. She takes PPI first thing in the morning and again before she eats dinner. She is still having a lot of belching, some intermittent RUQ pain. She has noticed more epigastric pain at times. No radiation of pain into her back. She notes a lot of phlegm in throat as well and having to clear her throat a lot. She notes she ate some fried fish on Saturday evening, on Sunday morning she started belching and having reflux and felt like she was tasting the fish she had eaten the night before. She did not take anything for her symptoms on Sunday, symptoms eventually eased off. She denies any ongoing nausea. Not taking anything for belching at this  time. Denies rectal bleeding or melena. She notes at times she feels good gets stuck in epigastric region and she has to drink a lot to get food to pass, especially with meats.   Taking amitiza  BID, she notes that she has to concentrate and really focus to defecate. She denies needing to strain or hard stools. Having a BM every few days.    EGD: 09/2022- No endoscopic esophageal abnormality to explain                            patient's dysphagia. Esophagus dilated. Dilated.                           - 1 cm hiatal hernia.                           - Normal stomach.                           - Normal examined duodenum.                           - No specimens collected. Colonoscopy: 09/2022- One 1 mm polyp in the ascending colon, removed  with a cold biopsy forceps. Resected and retrieved.                           - Anal papilla(e) were hypertrophied. (1 SSL)    Repeat Colonoscopy in 5 years     Past Medical History:  Diagnosis Date   DDD (degenerative disc disease), lumbosacral    Family history of adverse reaction to anesthesia    father and brother--- ponv   GERD (gastroesophageal reflux disease)    Hemorrhoids    History of kidney stones    History of low potassium    per pt 11/ 2014 ED visit @[facility]  with right arm pain, ekg normal , but had to have IV potassium, no issues since   Hypertension    followed by pcp   (09-13-2019  per pt never had stress test)   Iron  deficiency anemia secondary to blood loss (chronic)    followed by Dr Rogers (hematology)    d/t uterine blood loss;  treated with intermittant iron  infusions   Irritable bowel syndrome with constipation    Microcytic anemia    PMB (postmenopausal bleeding)    PONV (postoperative nausea and vomiting)    Rosacea    Sciatica, right side     Past Surgical History:  Procedure Laterality Date   ANTERIOR CERVICAL DECOMP/DISCECTOMY FUSION N/A 06/12/2013   Procedure: ANTERIOR CERVICAL  DECOMPRESSION/DISCECTOMY FUSION 1 LEVEL;  Surgeon: Victory Gens, MD;  Location: [facility];  Service: Neurosurgery;  Laterality: N/A;  C5-6 Anterior cervical decompression/diskectomy/fusion   APPENDECTOMY  06/23/2011   BREAST BIOPSY Right 2014   per pt benign   COLONOSCOPY W/ POLYPECTOMY  2012   COLONOSCOPY WITH PROPOFOL  N/A 10/16/2022   Procedure: COLONOSCOPY WITH PROPOFOL ;  Surgeon: Eartha Angelia Sieving, MD;  Location: [facility];  Service: Gastroenterology;  Laterality: N/A;  11:30am; ASA 2   DIAGNOSTIC LAPAROSCOPY  2002   DILATATION & CURETTAGE/HYSTEROSCOPY WITH MYOSURE N/A 02/09/2022   Procedure: DILATATION & CURETTAGE/HYSTEROSCOPY WITH MYOSURE;  Surgeon: Gorge Ade, MD;  Location: [facility];  Service: Gynecology;  Laterality: N/A;   DILITATION & CURRETTAGE/HYSTROSCOPY WITH NOVASURE ABLATION N/A 09/19/2019   Procedure: DILATATION & CURETTAGE/HYSTEROSCOPY;  Surgeon: Gorge Ade, MD;  Location: [facility];  Service: Gynecology;  Laterality: N/A;   ESOPHAGOGASTRODUODENOSCOPY (EGD) WITH PROPOFOL  N/A 10/16/2022   Procedure: ESOPHAGOGASTRODUODENOSCOPY (EGD) WITH PROPOFOL ;  Surgeon: Eartha Angelia Sieving, MD;  Location: [facility];  Service: Gastroenterology;  Laterality: N/A;  11:30am:ASA 2   FLEXIBLE SIGMOIDOSCOPY N/A 04/27/2018   Procedure: FLEXIBLE SIGMOIDOSCOPY;  Surgeon: Golda Claudis PENNER, MD;  Location: [facility];  Service: Endoscopy;  Laterality: N/A;  8:30   FOOT SURGERY Right 2005   Bunionectomy   POLYPECTOMY  10/16/2022   Procedure: POLYPECTOMY;  Surgeon: Eartha Angelia, Sieving, MD;  Location: [facility];  Service: Gastroenterology;;   ROBOTIC ASSISTED LAPAROSCOPIC LYSIS OF ADHESION N/A 09/19/2019   Procedure: XI ROBOTIC ASSISTED LAPAROSCOPIC LYSIS OF ADHESION/Ablation of Endometriosis;  Surgeon: Gorge Ade, MD;  Location: [facility];  Service: Gynecology;  Laterality: N/A;   SAVORY DILATION  10/16/2022    Procedure: SAVORY DILATION;  Surgeon: Eartha Angelia, Sieving, MD;  Location: [facility];  Service: Gastroenterology;;    Current Outpatient Medications  Medication Sig Dispense Refill   b complex vitamins capsule Take 1 capsule by mouth daily.     Calcium Carbonate Antacid (CALCIUM CARBONATE PO) Take 2 tablets by mouth daily.  cetirizine (ZYRTEC) 10 MG tablet Take 10 mg by mouth every evening.     CRANBERRY PO Take 1,000 mg by mouth at bedtime as needed (UTI Symptoms).     cyclobenzaprine (FLEXERIL) 10 MG tablet Take 10 mg by mouth 3 (three) times daily as needed for muscle spasms.     Estradiol-Progesterone (BIJUVA) 1-100 MG CAPS Take 1 capsule by mouth at bedtime. (Patient taking differently: Take 1 capsule by mouth at bedtime. 0.5 mg)     Famotidine  (PEPCID  PO) Take by mouth. Takes 10mg  otc as needed.     fluticasone  (FLONASE ) 50 MCG/ACT nasal spray Place 2 sprays into both nostrils daily.      furosemide (LASIX) 40 MG tablet Take 40 mg by mouth daily.      gabapentin (NEURONTIN) 300 MG capsule Take 300 mg by mouth at bedtime.     ibuprofen (ADVIL) 200 MG tablet Take 800 mg by mouth at bedtime.     lubiprostone  (AMITIZA ) 8 MCG capsule TAKE ONE CAPSULE BY MOUTH TWICE DAILY WITH MEALS 60 capsule 3   pantoprazole  (PROTONIX ) 40 MG tablet Take 1 tablet (40 mg total) by mouth 2 (two) times daily. 90 tablet 3   potassium chloride  (KLOR-CON  M) 10 MEQ tablet Take 20 mEq by mouth daily.     valACYclovir (VALTREX) 1000 MG tablet Take 4,000 mg by mouth once. (Patient taking differently: Take 4,000 mg by mouth once. PRN)     Vitamin D , Ergocalciferol , (DRISDOL ) 1.25 MG (50000 UNIT) CAPS capsule TAKE ONE CAPSULE BY MOUTH WEEKLY 12 capsule 3   No current facility-administered medications for this visit.    Allergies as of 05/22/2024 - Review Complete 05/22/2024  Allergen Reaction Noted   Erythromycin Itching 06/09/2013   Hydrocodone Itching 05/15/2011   Other Hives, Itching, Swelling,  and Rash 05/15/2011    Social History   Socioeconomic History   Marital status: Married    Spouse name: Not on file   Number of children: Not on file   Years of education: Not on file   Highest education level: Not on file  Occupational History   Not on file  Tobacco Use   Smoking status: Never   Smokeless tobacco: Never  Vaping Use   Vaping status: Never Used  Substance and Sexual Activity   Alcohol use: No   Drug use: Never   Sexual activity: Not on file  Other Topics Concern   Not on file  Social History Narrative   Not on file   Social Drivers of Health   Financial Resource Strain: Not on file  Food Insecurity: Not on file  Transportation Needs: Not on file  Physical Activity: Not on file  Stress: Not on file  Social Connections: Not on file    Review of systems General: negative for malaise, night sweats, fever, chills, weight loss Neck: Negative for lumps, goiter, pain and significant neck swelling Resp: Negative for cough, wheezing, dyspnea at rest CV: Negative for chest pain, leg swelling, palpitations, orthopnea GI: denies melena, hematochezia, nausea, vomiting, diarrhea, constipation, dysphagia, odyonophagia, early satiety or unintentional weight loss. +belching +acid regurgitation +epigastric pain  MSK: Negative for joint pain or swelling, back pain, and muscle pain. Derm: Negative for itching or rash Psych: Denies depression, anxiety, memory loss, confusion. No homicidal or suicidal ideation.  Heme: Negative for prolonged bleeding, bruising easily, and swollen nodes. Endocrine: Negative for cold or heat intolerance, polyuria, polydipsia and goiter. Neuro: negative for tremor, gait imbalance, syncope and seizures. The remainder of the  review of systems is noncontributory.  Physical Exam: LMP 03/10/2021  General:   Alert and oriented. No distress noted. Pleasant and cooperative.  Head:  Normocephalic and atraumatic. Eyes:  Conjuctiva clear without  scleral icterus. Mouth:  Oral mucosa pink and moist. Good dentition. No lesions. Heart: Normal rate and rhythm, s1 and s2 heart sounds present.  Lungs: Clear lung sounds in all lobes. Respirations equal and unlabored. Abdomen:  +BS, soft, and non-distended. TTP of epigastric/LUQ. No rebound or guarding. No HSM or masses noted. Derm: No palmar erythema or jaundice Msk:  Symmetrical without gross deformities. Normal posture. Extremities:  Without edema. Neurologic:  Alert and  oriented x4 Psych:  Alert and cooperative. Normal mood and affect.  Invalid input(s): 6 MONTHS   ASSESSMENT: Kaitlin [name] is a 53 y.o. female presenting today for follow up of IBS-C, GERD, epigastric pain, belching and dysphagia  IBS-C: mostly well controlled on amitiza  8mcg BID. Having a BM every 2-3 days without hard stools or needing to strain. Feels she has to concentrate times to defecate. For now will continue with current bowel regimen, good water  intake, high fiber diet  GERD, regurgitation, dysphagia, belching: previously on omeprazole , switched to protonix  last year, increased this to BID at last visit. She continues to have a lot of phlegm in throat, regurgitation, belching and now with some epigastric pain, previously some RUQ pain. She is taking pepcid  at bedtime as well. She note some dysphagia at times. At this time, given epigastric pain and GERD difficult to manage, recommend EGD for further evaluation, +/- dilation given dysphagia. Cannot rule out gastritis, duodenitis, PUD as contributors to her symptoms. May need to consider ph impedence and esophageal manometry testing if EGD unremarkable. Will stop protonix  and start dexilant  60mg  daily.  Indications, risks and benefits of procedure discussed in detail with patient. Patient verbalized understanding and is in agreement to proceed with EGD +/- dilation.     PLAN:  -stop protonix  -start dexilant  60mg  daily -can use pepcid  at bedtime PRN -good  reflux precautions -EGD +/- dilation ASA II  -consider Ph impdence/manometry pending EGD Findings  -continue amitiza  8mcg BID -good reflux precautions   All questions were answered, patient verbalized understanding and is in agreement with plan as outlined above.   Follow Up: 3 months   Ola Fawver L. Mariette, MSN, APRN, AGNP-C Adult-Gerontology Nurse Practitioner [facility]  I have reviewed the note and agree with the APP's assessment as described in this progress note  Toribio Fortune, MD Gastroenterology and Hepatology [facility]

## 2024-05-22 NOTE — Telephone Encounter (Signed)
 Spoke with patient in the office, scheduled EGD/DIL for 06/09/2024 at 11:15am. Instructions given to patient in the office.

## 2024-05-22 NOTE — H&P (View-Only) (Signed)
 Referring Provider: Lari Elspeth BRAVO, MD Primary Care Physician:  Lari Elspeth BRAVO, MD Primary GI Physician: Dr. Eartha   Chief Complaint  Patient presents with   Chronic GERD    Pt arrives for follow up. Reports having issues with belching and some abdominal pain (middle area of abdomen under rib cage). Some right sided pain occasionally. Some nausea after eating, not many episode. Coughing a lot in the mornings to clear throat, has a lot of drainage.    HPI:   Kaitlin [name] is a 53 y.o. female with past medical history of GERD, Hemorrhoids, IBS, IDA, HTN, rosacea.    Patient presenting today for:  Follow up of GERD, belching, epigastric pain, dysphagia, IBS-C  Last seen September, at that time having more belching, hiccups, some heartburn, intermittent nausea, RUQ Pain, taking famotidine  at night recently. Feeling bloated on ibsrela , currently on amitiza  but not BID everyday, straining occasionally   Recommended to take amitiza  BID, good reflux precautions, increase PPI to BID, continue famotidine  at bedtime, good water  intake, high fiber diet, pt to call with report on symptoms in 2 weeks, consider RUQ US /Possible HIDA scan if symptoms not improving with PPI therapy  Present: States she is taking Protonix  BID and taking pepcid  at night. She takes PPI first thing in the morning and again before she eats dinner. She is still having a lot of belching, some intermittent RUQ pain. She has noticed more epigastric pain at times. No radiation of pain into her back. She notes a lot of phlegm in throat as well and having to clear her throat a lot. She notes she ate some fried fish on Saturday evening, on Sunday morning she started belching and having reflux and felt like she was tasting the fish she had eaten the night before. She did not take anything for her symptoms on Sunday, symptoms eventually eased off. She denies any ongoing nausea. Not taking anything for belching at this  time. Denies rectal bleeding or melena. She notes at times she feels good gets stuck in epigastric region and she has to drink a lot to get food to pass, especially with meats.   Taking amitiza  BID, she notes that she has to concentrate and really focus to defecate. She denies needing to strain or hard stools. Having a BM every few days.    EGD: 09/2022- No endoscopic esophageal abnormality to explain                            patient's dysphagia. Esophagus dilated. Dilated.                           - 1 cm hiatal hernia.                           - Normal stomach.                           - Normal examined duodenum.                           - No specimens collected. Colonoscopy: 09/2022- One 1 mm polyp in the ascending colon, removed  with a cold biopsy forceps. Resected and retrieved.                           - Anal papilla(e) were hypertrophied. (1 SSL)    Repeat Colonoscopy in 5 years     Past Medical History:  Diagnosis Date   DDD (degenerative disc disease), lumbosacral    Family history of adverse reaction to anesthesia    father and brother--- ponv   GERD (gastroesophageal reflux disease)    Hemorrhoids    History of kidney stones    History of low potassium    per pt 11/ 2014 ED visit @[facility]  with right arm pain, ekg normal , but had to have IV potassium, no issues since   Hypertension    followed by pcp   (09-13-2019  per pt never had stress test)   Iron  deficiency anemia secondary to blood loss (chronic)    followed by Dr Rogers (hematology)    d/t uterine blood loss;  treated with intermittant iron  infusions   Irritable bowel syndrome with constipation    Microcytic anemia    PMB (postmenopausal bleeding)    PONV (postoperative nausea and vomiting)    Rosacea    Sciatica, right side     Past Surgical History:  Procedure Laterality Date   ANTERIOR CERVICAL DECOMP/DISCECTOMY FUSION N/A 06/12/2013   Procedure: ANTERIOR CERVICAL  DECOMPRESSION/DISCECTOMY FUSION 1 LEVEL;  Surgeon: Victory Gens, MD;  Location: [facility];  Service: Neurosurgery;  Laterality: N/A;  C5-6 Anterior cervical decompression/diskectomy/fusion   APPENDECTOMY  06/23/2011   BREAST BIOPSY Right 2014   per pt benign   COLONOSCOPY W/ POLYPECTOMY  2012   COLONOSCOPY WITH PROPOFOL  N/A 10/16/2022   Procedure: COLONOSCOPY WITH PROPOFOL ;  Surgeon: Eartha Angelia Sieving, MD;  Location: [facility];  Service: Gastroenterology;  Laterality: N/A;  11:30am; ASA 2   DIAGNOSTIC LAPAROSCOPY  2002   DILATATION & CURETTAGE/HYSTEROSCOPY WITH MYOSURE N/A 02/09/2022   Procedure: DILATATION & CURETTAGE/HYSTEROSCOPY WITH MYOSURE;  Surgeon: Gorge Ade, MD;  Location: [facility];  Service: Gynecology;  Laterality: N/A;   DILITATION & CURRETTAGE/HYSTROSCOPY WITH NOVASURE ABLATION N/A 09/19/2019   Procedure: DILATATION & CURETTAGE/HYSTEROSCOPY;  Surgeon: Gorge Ade, MD;  Location: [facility];  Service: Gynecology;  Laterality: N/A;   ESOPHAGOGASTRODUODENOSCOPY (EGD) WITH PROPOFOL  N/A 10/16/2022   Procedure: ESOPHAGOGASTRODUODENOSCOPY (EGD) WITH PROPOFOL ;  Surgeon: Eartha Angelia Sieving, MD;  Location: [facility];  Service: Gastroenterology;  Laterality: N/A;  11:30am:ASA 2   FLEXIBLE SIGMOIDOSCOPY N/A 04/27/2018   Procedure: FLEXIBLE SIGMOIDOSCOPY;  Surgeon: Golda Claudis PENNER, MD;  Location: [facility];  Service: Endoscopy;  Laterality: N/A;  8:30   FOOT SURGERY Right 2005   Bunionectomy   POLYPECTOMY  10/16/2022   Procedure: POLYPECTOMY;  Surgeon: Eartha Angelia, Sieving, MD;  Location: [facility];  Service: Gastroenterology;;   ROBOTIC ASSISTED LAPAROSCOPIC LYSIS OF ADHESION N/A 09/19/2019   Procedure: XI ROBOTIC ASSISTED LAPAROSCOPIC LYSIS OF ADHESION/Ablation of Endometriosis;  Surgeon: Gorge Ade, MD;  Location: [facility];  Service: Gynecology;  Laterality: N/A;   SAVORY DILATION  10/16/2022    Procedure: SAVORY DILATION;  Surgeon: Eartha Angelia, Sieving, MD;  Location: [facility];  Service: Gastroenterology;;    Current Outpatient Medications  Medication Sig Dispense Refill   b complex vitamins capsule Take 1 capsule by mouth daily.     Calcium Carbonate Antacid (CALCIUM CARBONATE PO) Take 2 tablets by mouth daily.  cetirizine (ZYRTEC) 10 MG tablet Take 10 mg by mouth every evening.     CRANBERRY PO Take 1,000 mg by mouth at bedtime as needed (UTI Symptoms).     cyclobenzaprine (FLEXERIL) 10 MG tablet Take 10 mg by mouth 3 (three) times daily as needed for muscle spasms.     Estradiol-Progesterone (BIJUVA) 1-100 MG CAPS Take 1 capsule by mouth at bedtime. (Patient taking differently: Take 1 capsule by mouth at bedtime. 0.5 mg)     Famotidine  (PEPCID  PO) Take by mouth. Takes 10mg  otc as needed.     fluticasone  (FLONASE ) 50 MCG/ACT nasal spray Place 2 sprays into both nostrils daily.      furosemide (LASIX) 40 MG tablet Take 40 mg by mouth daily.      gabapentin (NEURONTIN) 300 MG capsule Take 300 mg by mouth at bedtime.     ibuprofen (ADVIL) 200 MG tablet Take 800 mg by mouth at bedtime.     lubiprostone  (AMITIZA ) 8 MCG capsule TAKE ONE CAPSULE BY MOUTH TWICE DAILY WITH MEALS 60 capsule 3   pantoprazole  (PROTONIX ) 40 MG tablet Take 1 tablet (40 mg total) by mouth 2 (two) times daily. 90 tablet 3   potassium chloride  (KLOR-CON  M) 10 MEQ tablet Take 20 mEq by mouth daily.     valACYclovir (VALTREX) 1000 MG tablet Take 4,000 mg by mouth once. (Patient taking differently: Take 4,000 mg by mouth once. PRN)     Vitamin D , Ergocalciferol , (DRISDOL ) 1.25 MG (50000 UNIT) CAPS capsule TAKE ONE CAPSULE BY MOUTH WEEKLY 12 capsule 3   No current facility-administered medications for this visit.    Allergies as of 05/22/2024 - Review Complete 05/22/2024  Allergen Reaction Noted   Erythromycin Itching 06/09/2013   Hydrocodone Itching 05/15/2011   Other Hives, Itching, Swelling,  and Rash 05/15/2011    Social History   Socioeconomic History   Marital status: Married    Spouse name: Not on file   Number of children: Not on file   Years of education: Not on file   Highest education level: Not on file  Occupational History   Not on file  Tobacco Use   Smoking status: Never   Smokeless tobacco: Never  Vaping Use   Vaping status: Never Used  Substance and Sexual Activity   Alcohol use: No   Drug use: Never   Sexual activity: Not on file  Other Topics Concern   Not on file  Social History Narrative   Not on file   Social Drivers of Health   Financial Resource Strain: Not on file  Food Insecurity: Not on file  Transportation Needs: Not on file  Physical Activity: Not on file  Stress: Not on file  Social Connections: Not on file    Review of systems General: negative for malaise, night sweats, fever, chills, weight loss Neck: Negative for lumps, goiter, pain and significant neck swelling Resp: Negative for cough, wheezing, dyspnea at rest CV: Negative for chest pain, leg swelling, palpitations, orthopnea GI: denies melena, hematochezia, nausea, vomiting, diarrhea, constipation, dysphagia, odyonophagia, early satiety or unintentional weight loss. +belching +acid regurgitation +epigastric pain  MSK: Negative for joint pain or swelling, back pain, and muscle pain. Derm: Negative for itching or rash Psych: Denies depression, anxiety, memory loss, confusion. No homicidal or suicidal ideation.  Heme: Negative for prolonged bleeding, bruising easily, and swollen nodes. Endocrine: Negative for cold or heat intolerance, polyuria, polydipsia and goiter. Neuro: negative for tremor, gait imbalance, syncope and seizures. The remainder of the  review of systems is noncontributory.  Physical Exam: LMP 03/10/2021  General:   Alert and oriented. No distress noted. Pleasant and cooperative.  Head:  Normocephalic and atraumatic. Eyes:  Conjuctiva clear without  scleral icterus. Mouth:  Oral mucosa pink and moist. Good dentition. No lesions. Heart: Normal rate and rhythm, s1 and s2 heart sounds present.  Lungs: Clear lung sounds in all lobes. Respirations equal and unlabored. Abdomen:  +BS, soft, and non-distended. TTP of epigastric/LUQ. No rebound or guarding. No HSM or masses noted. Derm: No palmar erythema or jaundice Msk:  Symmetrical without gross deformities. Normal posture. Extremities:  Without edema. Neurologic:  Alert and  oriented x4 Psych:  Alert and cooperative. Normal mood and affect.  Invalid input(s): 6 MONTHS   ASSESSMENT: Kaitlin [name] is a 53 y.o. female presenting today for follow up of IBS-C, GERD, epigastric pain, belching and dysphagia  IBS-C: mostly well controlled on amitiza  8mcg BID. Having a BM every 2-3 days without hard stools or needing to strain. Feels she has to concentrate times to defecate. For now will continue with current bowel regimen, good water  intake, high fiber diet  GERD, regurgitation, dysphagia, belching: previously on omeprazole , switched to protonix  last year, increased this to BID at last visit. She continues to have a lot of phlegm in throat, regurgitation, belching and now with some epigastric pain, previously some RUQ pain. She is taking pepcid  at bedtime as well. She note some dysphagia at times. At this time, given epigastric pain and GERD difficult to manage, recommend EGD for further evaluation, +/- dilation given dysphagia. Cannot rule out gastritis, duodenitis, PUD as contributors to her symptoms. May need to consider ph impedence and esophageal manometry testing if EGD unremarkable. Will stop protonix  and start dexilant  60mg  daily.  Indications, risks and benefits of procedure discussed in detail with patient. Patient verbalized understanding and is in agreement to proceed with EGD +/- dilation.     PLAN:  -stop protonix  -start dexilant  60mg  daily -can use pepcid  at bedtime PRN -good  reflux precautions -EGD +/- dilation ASA II  -consider Ph impdence/manometry pending EGD Findings  -continue amitiza  8mcg BID -good reflux precautions   All questions were answered, patient verbalized understanding and is in agreement with plan as outlined above.   Follow Up: 3 months   Ola Fawver L. Mariette, MSN, APRN, AGNP-C Adult-Gerontology Nurse Practitioner [facility]  I have reviewed the note and agree with the APP's assessment as described in this progress note  Toribio Fortune, MD Gastroenterology and Hepatology [facility] Gastroenter[facility]

## 2024-05-22 NOTE — Patient Instructions (Signed)
 Stop protonix  Start dexilant  60mg  daily Avoid greasy, spicy, fried, citrus foods, and be mindful that caffeine, carbonated drinks, chocolate and alcohol can increase reflux symptoms Stay upright 2-3 hours after eating, prior to lying down and avoid eating late in the evenings. We will get you scheduled fro EGD You can use pepcid  at night as needed  Follow up 3 months  It was a pleasure to see you today. I want to create trusting relationships with patients and provide genuine, compassionate, and quality care. I truly value your feedback! please be on the lookout for a survey regarding your visit with me today. I appreciate your input about our visit and your time in completing this!    Kaitlin [name] L. Kaleeya Hancock, MSN, APRN, AGNP-C Adult-Gerontology Nurse Practitioner [facility] Gastroenterology at [facility]

## 2024-05-24 NOTE — Telephone Encounter (Signed)
 Patient called stating that she cannot get off work on 06/09/2024. Rescheduled patient to 06/13/2024 at 9:00am. Sending updated instructions to mychart. Message sent to endo.

## 2024-05-31 ENCOUNTER — Inpatient Hospital Stay

## 2024-05-31 LAB — LAB REPORT - SCANNED
A1c: 5.4
EGFR: 66.2
TSH: 3.23 (ref 0.41–5.90)

## 2024-06-01 ENCOUNTER — Ambulatory Visit (INDEPENDENT_AMBULATORY_CARE_PROVIDER_SITE_OTHER): Admitting: Gastroenterology

## 2024-06-07 ENCOUNTER — Encounter (HOSPITAL_COMMUNITY)
Admission: RE | Admit: 2024-06-07 | Discharge: 2024-06-07 | Disposition: A | Source: Ambulatory Visit | Attending: Gastroenterology

## 2024-06-07 NOTE — Progress Notes (Signed)
 Attempted pre op call but no answer.  Left a voicemail to call us  back.

## 2024-06-08 NOTE — Pre-Procedure Instructions (Signed)
 Attempted pre-op phonecall. Left VM for her to call us  back.

## 2024-06-12 ENCOUNTER — Encounter (HOSPITAL_COMMUNITY): Payer: Self-pay

## 2024-06-12 ENCOUNTER — Other Ambulatory Visit: Payer: Self-pay

## 2024-06-13 ENCOUNTER — Other Ambulatory Visit: Payer: Self-pay

## 2024-06-13 ENCOUNTER — Encounter (HOSPITAL_COMMUNITY): Payer: Self-pay | Admitting: Gastroenterology

## 2024-06-13 ENCOUNTER — Ambulatory Visit (HOSPITAL_COMMUNITY): Admitting: Anesthesiology

## 2024-06-13 ENCOUNTER — Encounter (HOSPITAL_COMMUNITY): Admission: RE | Disposition: A | Payer: Self-pay | Source: Home / Self Care | Attending: Gastroenterology

## 2024-06-13 ENCOUNTER — Ambulatory Visit (HOSPITAL_COMMUNITY)
Admission: RE | Admit: 2024-06-13 | Discharge: 2024-06-13 | Disposition: A | Attending: Gastroenterology | Admitting: Gastroenterology

## 2024-06-13 DIAGNOSIS — Z79899 Other long term (current) drug therapy: Secondary | ICD-10-CM | POA: Insufficient documentation

## 2024-06-13 DIAGNOSIS — I10 Essential (primary) hypertension: Secondary | ICD-10-CM

## 2024-06-13 DIAGNOSIS — K2970 Gastritis, unspecified, without bleeding: Secondary | ICD-10-CM | POA: Diagnosis not present

## 2024-06-13 DIAGNOSIS — Z6834 Body mass index (BMI) 34.0-34.9, adult: Secondary | ICD-10-CM

## 2024-06-13 DIAGNOSIS — R142 Eructation: Secondary | ICD-10-CM | POA: Diagnosis not present

## 2024-06-13 DIAGNOSIS — R1010 Upper abdominal pain, unspecified: Secondary | ICD-10-CM | POA: Diagnosis present

## 2024-06-13 DIAGNOSIS — K317 Polyp of stomach and duodenum: Secondary | ICD-10-CM | POA: Insufficient documentation

## 2024-06-13 DIAGNOSIS — R059 Cough, unspecified: Secondary | ICD-10-CM | POA: Insufficient documentation

## 2024-06-13 DIAGNOSIS — R1310 Dysphagia, unspecified: Secondary | ICD-10-CM | POA: Diagnosis not present

## 2024-06-13 DIAGNOSIS — D500 Iron deficiency anemia secondary to blood loss (chronic): Secondary | ICD-10-CM | POA: Diagnosis not present

## 2024-06-13 DIAGNOSIS — K319 Disease of stomach and duodenum, unspecified: Secondary | ICD-10-CM | POA: Diagnosis not present

## 2024-06-13 DIAGNOSIS — K219 Gastro-esophageal reflux disease without esophagitis: Secondary | ICD-10-CM | POA: Insufficient documentation

## 2024-06-13 DIAGNOSIS — K449 Diaphragmatic hernia without obstruction or gangrene: Secondary | ICD-10-CM | POA: Diagnosis not present

## 2024-06-13 DIAGNOSIS — K581 Irritable bowel syndrome with constipation: Secondary | ICD-10-CM | POA: Insufficient documentation

## 2024-06-13 DIAGNOSIS — E669 Obesity, unspecified: Secondary | ICD-10-CM

## 2024-06-13 HISTORY — PX: ESOPHAGOGASTRODUODENOSCOPY: SHX5428

## 2024-06-13 HISTORY — PX: ESOPHAGEAL DILATION: SHX303

## 2024-06-13 SURGERY — EGD (ESOPHAGOGASTRODUODENOSCOPY)
Anesthesia: Monitor Anesthesia Care

## 2024-06-13 MED ORDER — LACTATED RINGERS IV SOLN: INTRAVENOUS | Status: DC

## 2024-06-13 MED ORDER — LIDOCAINE 2% (20 MG/ML) 5 ML SYRINGE
INTRAMUSCULAR | Status: DC | PRN
  Administered 2024-06-13: 09:00:00 50 mg via INTRAVENOUS

## 2024-06-13 MED ORDER — PROPOFOL 10 MG/ML IV BOLUS
INTRAVENOUS | Status: DC | PRN
  Administered 2024-06-13 (×2): 50 mg via INTRAVENOUS
  Administered 2024-06-13: 09:00:00 70 mg via INTRAVENOUS
  Administered 2024-06-13: 09:00:00 30 mg via INTRAVENOUS
  Administered 2024-06-13: 09:00:00 100 mg via INTRAVENOUS

## 2024-06-13 NOTE — Op Note (Signed)
 [facility] Patient Name: Kaitlin [name] Procedure Date: 06/13/2024 8:01 AM MRN: [id number] Date of Birth: [birth year] Attending MD: Toribio Fortune , , [id number] CSN: [id number] Age: 53 Admit Type: Outpatient Procedure:                Upper GI endoscopy Indications:              Upper abdominal pain, Dysphagia, Heartburn Providers:                Toribio Fortune, Rosina Sprague, Madelin Hunter, RN Referring MD:              Medicines:                Monitored Anesthesia Care Complications:            No immediate complications. Estimated Blood Loss:     Estimated blood loss: none. Procedure:                Pre-Anesthesia Assessment:                           - Prior to the procedure, a History and Physical                            was performed, and patient medications, allergies                            and sensitivities were reviewed. The patient's                            tolerance of previous anesthesia was reviewed.                           - The risks and benefits of the procedure and the                            sedation options and risks were discussed with the                            patient. All questions were answered and informed                            consent was obtained.                           - ASA Grade Assessment: II - A patient with mild                            systemic disease.                           After obtaining informed consent, the endoscope was                            passed under direct vision. Throughout the                            procedure, the  patient's blood pressure, pulse, and                            oxygen saturations were monitored continuously. The                            [id number]-[id number] ([id number]) Upper was introduced through                            the mouth, and advanced to the second part of                            duodenum. The upper GI endoscopy was accomplished                             without difficulty. The patient tolerated the                            procedure well. Scope In: 8:35:17 AM Scope Out: 8:46:14 AM Total Procedure Duration: 0 hours 10 minutes 57 seconds  Findings:      No endoscopic abnormality was evident in the esophagus to explain the       patient's complaint of dysphagia. It was decided, however, to proceed       with dilation of the entire esophagus. A guidewire was placed and the       scope was withdrawn. Dilation was performed with a Savary dilator with       mild resistance at 20 mm. The dilation site was examined following       endoscope reinsertion and showed no change. Biopsies were obtained from       the proximal and distal esophagus with cold forceps for histology of       suspected eosinophilic esophagitis.      The gastroesophageal flap valve was visualized endoscopically and       classified as Hill Grade II (fold present, opens with respiration).      A 1 cm hiatal hernia was present.      Multiple small sessile fundic gland polyps with no bleeding were found       in the gastric body.      Patchy mild inflammation characterized by erythema was found in the       gastric body. Biopsies were taken with a cold forceps for Helicobacter       pylori testing.      The examined duodenum was normal. Impression:               - No endoscopic esophageal abnormality to explain                            patient's dysphagia. Esophagus dilated. Dilated.                           - 1 cm hiatal hernia.                           - Multiple fundic gland polyps.                           -  Gastritis. Biopsied.                           - Normal examined duodenum.                           - Biopsies were taken with a cold forceps for                            evaluation of eosinophilic esophagitis. Moderate Sedation:      Per Anesthesia Care Recommendation:           - Discharge patient to home (ambulatory).                           - Resume  previous diet.                           - Continue present medications.                           - If persistent dysphagia, will proceed with                            esophageal manometry.                           - If persistent heartburn, will proceed with pH                            impedance testing on PPI.                           - Await pathology results. Procedure Code(s):        --- Professional ---                           [id number], Esophagogastroduodenoscopy, flexible,                            transoral; with insertion of guide wire followed by                            passage of dilator(s) through esophagus over guide                            wire                           43239, 59, Esophagogastroduodenoscopy, flexible,                            transoral; with biopsy, single or multiple Diagnosis Code(s):        --- Professional ---                           R13.10, Dysphagia, unspecified  K44.9, Diaphragmatic hernia without obstruction or                            gangrene                           K31.7, Polyp of stomach and duodenum                           K29.70, Gastritis, unspecified, without bleeding                           R10.10, Upper abdominal pain, unspecified                           R12, Heartburn CPT copyright 2022 American Medical Association. All rights reserved. The codes documented in this report are preliminary and upon coder review may  be revised to meet current compliance requirements. Toribio Fortune, MD Toribio Fortune,  06/13/2024 8:53:53 AM This report has been signed electronically. Number of Addenda: 0

## 2024-06-13 NOTE — Discharge Instructions (Signed)
 You are being discharged to home.  Resume your previous diet.  Continue your present medications.  We are waiting for your pathology results.

## 2024-06-13 NOTE — Anesthesia Postprocedure Evaluation (Signed)
 Anesthesia Post Note  Patient: Kaitlin [name]  Procedure(s) Performed: EGD (ESOPHAGOGASTRODUODENOSCOPY) DILATION, ESOPHAGUS  Patient location during evaluation: Endoscopy Anesthesia Type: MAC Level of consciousness: awake and alert Pain management: pain level controlled Vital Signs Assessment: post-procedure vital signs reviewed and stable Respiratory status: spontaneous breathing Cardiovascular status: blood pressure returned to baseline and stable Postop Assessment: no apparent nausea or vomiting Anesthetic complications: no Comments: Late entry   No notable events documented.   Last Vitals:  Vitals:   06/13/24 0738 06/13/24 0850  BP: 133/79 120/65  Pulse: 76 89  Resp: 13 20  Temp: 36.6 C 36.6 C  SpO2: 98%     Last Pain:  Vitals:   06/13/24 0850  TempSrc: Oral  PainSc: 0-No pain                 Darreon Lutes

## 2024-06-13 NOTE — Transfer of Care (Signed)
 Immediate Anesthesia Transfer of Care Note  Patient: Kaitlin [name]  Procedure(s) Performed: EGD (ESOPHAGOGASTRODUODENOSCOPY) DILATION, ESOPHAGUS  Patient Location: PACU and Endoscopy Unit  Anesthesia Type:General  Level of Consciousness: awake  Airway & Oxygen Therapy: Patient Spontanous Breathing  Post-op Assessment: Report given to RN  Post vital signs: Reviewed and stable  Last Vitals:  Vitals Value Taken Time  BP    Temp    Pulse    Resp    SpO2      Last Pain:  Vitals:   06/13/24 0830  TempSrc:   PainSc: 0-No pain      Patients Stated Pain Goal: 6 (06/13/24 0738)  Complications: No notable events documented.

## 2024-06-13 NOTE — Interval H&P Note (Signed)
 History and Physical Interval Note:  06/13/2024 8:10 AM  Kaitlin [name]  has presented today for surgery, with the diagnosis of epigastric pain, uncontrolled gastroesophogeal reflux disease, dysphagia.  The various methods of treatment have been discussed with the patient and family. After consideration of risks, benefits and other options for treatment, the patient has consented to  Procedures with comments: EGD (ESOPHAGOGASTRODUODENOSCOPY) (N/A) - 11:15am, ASA 2 DILATION, ESOPHAGUS (N/A) as a surgical intervention.  The patient's history has been reviewed, patient examined, no change in status, stable for surgery.  I have reviewed the patient's chart and labs.  Questions were answered to the patient's satisfaction.     Tavaughn Silguero Castaneda Mayorga

## 2024-06-13 NOTE — Anesthesia Preprocedure Evaluation (Signed)
 Anesthesia Evaluation  Patient identified by MRN, date of birth, ID band Patient awake    Reviewed: Allergy & Precautions, H&P , NPO status , Patient's Chart, lab work & pertinent test results, reviewed documented beta blocker date and time   History of Anesthesia Complications (+) PONV, Family history of anesthesia reaction and history of anesthetic complications  Airway Mallampati: II  TM Distance: >3 FB Neck ROM: full    Dental no notable dental hx.    Pulmonary neg pulmonary ROS   Pulmonary exam normal breath sounds clear to auscultation       Cardiovascular Exercise Tolerance: Good hypertension,  Rhythm:regular Rate:Normal     Neuro/Psych  Neuromuscular disease  negative psych ROS   GI/Hepatic Neg liver ROS,GERD  ,,  Endo/Other  negative endocrine ROS    Renal/GU negative Renal ROS  negative genitourinary   Musculoskeletal   Abdominal   Peds  Hematology  (+) Blood dyscrasia, anemia   Anesthesia Other Findings   Reproductive/Obstetrics negative OB ROS                              Anesthesia Physical Anesthesia Plan  ASA: 2  Anesthesia Plan: MAC   Post-op Pain Management:    Induction:   PONV Risk Score and Plan: Propofol  infusion  Airway Management Planned:   Additional Equipment:   Intra-op Plan:   Post-operative Plan:   Informed Consent: I have reviewed the patients History and Physical, chart, labs and discussed the procedure including the risks, benefits and alternatives for the proposed anesthesia with the patient or authorized representative who has indicated his/her understanding and acceptance.     Dental Advisory Given  Plan Discussed with: CRNA  Anesthesia Plan Comments:         Anesthesia Quick Evaluation

## 2024-06-14 ENCOUNTER — Ambulatory Visit (INDEPENDENT_AMBULATORY_CARE_PROVIDER_SITE_OTHER): Payer: Self-pay | Admitting: Gastroenterology

## 2024-06-14 LAB — SURGICAL PATHOLOGY

## 2024-06-15 ENCOUNTER — Encounter (HOSPITAL_COMMUNITY): Payer: Self-pay | Admitting: Gastroenterology

## 2024-06-16 NOTE — Progress Notes (Signed)
 Patient result letter mailed procedure note and pathology result faxed to PCP

## 2024-06-20 ENCOUNTER — Other Ambulatory Visit

## 2024-06-20 ENCOUNTER — Inpatient Hospital Stay

## 2024-06-23 ENCOUNTER — Other Ambulatory Visit (INDEPENDENT_AMBULATORY_CARE_PROVIDER_SITE_OTHER): Payer: Self-pay | Admitting: Gastroenterology

## 2024-06-26 ENCOUNTER — Encounter: Payer: Self-pay | Admitting: *Deleted

## 2024-06-27 ENCOUNTER — Inpatient Hospital Stay: Admitting: Oncology

## 2024-06-27 ENCOUNTER — Ambulatory Visit: Admitting: Oncology

## 2024-06-28 ENCOUNTER — Other Ambulatory Visit: Payer: Self-pay

## 2024-06-28 ENCOUNTER — Inpatient Hospital Stay: Attending: Oncology | Admitting: Oncology

## 2024-06-28 ENCOUNTER — Encounter: Payer: Self-pay | Admitting: Oncology

## 2024-06-28 VITALS — BP 130/69 | HR 70 | Temp 97.5°F | Resp 18 | Ht 66.0 in | Wt 238.0 lb

## 2024-06-28 DIAGNOSIS — R0602 Shortness of breath: Secondary | ICD-10-CM | POA: Insufficient documentation

## 2024-06-28 DIAGNOSIS — D500 Iron deficiency anemia secondary to blood loss (chronic): Secondary | ICD-10-CM | POA: Insufficient documentation

## 2024-06-28 DIAGNOSIS — D508 Other iron deficiency anemias: Secondary | ICD-10-CM | POA: Diagnosis not present

## 2024-06-28 DIAGNOSIS — R5383 Other fatigue: Secondary | ICD-10-CM | POA: Diagnosis not present

## 2024-06-28 DIAGNOSIS — R059 Cough, unspecified: Secondary | ICD-10-CM | POA: Insufficient documentation

## 2024-06-28 DIAGNOSIS — E559 Vitamin D deficiency, unspecified: Secondary | ICD-10-CM | POA: Insufficient documentation

## 2024-06-28 DIAGNOSIS — N920 Excessive and frequent menstruation with regular cycle: Secondary | ICD-10-CM | POA: Insufficient documentation

## 2024-06-28 NOTE — Assessment & Plan Note (Addendum)
-   She is currently on Bijuva and did not have any vaginal bleeding since August 2023. - Labs from 05/31/2024 show essentially unremarkable iron  levels within normal hemoglobin.  Ferritin 157, vitamin B12 521, folic acid  13.1 with iron  saturations of 25%. -No additional IV iron  needed at this time. -Return to clinic in 1 year for labs and follow-up.

## 2024-06-28 NOTE — Progress Notes (Unsigned)
 "  [facility] OFFICE PROGRESS NOTE  Dow Longs, PA-C  ASSESSMENT & PLAN:  Assessment & Plan Other iron  deficiency anemia - She is currently on Bijuva and did not have any vaginal bleeding since August 2023. - Labs from 05/31/2024 show essentially unremarkable iron  levels within normal hemoglobin.  Ferritin 157, vitamin B12 521, folic acid  13.1 with iron  saturations of 25%. -No additional IV iron  needed at this time. -Return to clinic in 1 year for labs and follow-up. Vitamin D  deficiency - Vitamin D  level dropped back down to in the 30s after adjusting vitamin D  from weekly to every other week. - Recommend increasing vitamin D  to weekly. -Recheck levels in 1 year.  Orders Placed This Encounter  Procedures   CBC with Differential    Standing Status:   Future    Expected Date:   06/29/2025    Expiration Date:   09/27/2025   Comprehensive metabolic panel    Standing Status:   Future    Expected Date:   06/29/2025    Expiration Date:   09/27/2025   Iron  and TIBC ([facility] ONLY)    Standing Status:   Future    Expected Date:   06/29/2025    Expiration Date:   09/27/2025   Ferritin    Standing Status:   Future    Expected Date:   06/29/2025    Expiration Date:   09/27/2025   Vitamin D  25 hydroxy    Standing Status:   Future    Expected Date:   06/29/2025    Expiration Date:   09/27/2025    INTERVAL HISTORY: Patient returns for follow-up for iron  deficiency.  She last received IV Feraheme  x 1 dose on 06/18/2023.  She is not currently taking iron  supplements over-the-counter.  Patient had upper endoscopy on 06/13/2024 which showed no endoscopic esophageal abnormality to explain patient's dysphagia.  Esophagus dilated.  1 cm hiatal hernia.  Gastritis biopsied.  Pathology was negative for H. pylori and or dysplasia.  Reports she continues to have some dysphagia and what feels like acid reflux.  She has some epigastric tenderness.  Reports she feels like her  stomach is pushing up on her especially when she is lying down.  She is followed by GI and will see them back.  She is currently stable on PPI and Pepcid .  Appetite is 100% energy levels are 50%.  She has a cough at times and some shortness of breath.  Reports her energy levels are low.  We reviewed CBC, iron  panel, ferritin, folate, vitamin D  and B12.  SUMMARY OF HEMATOLOGIC HISTORY: Oncology History   No problem history exists.    1.  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 - Hemoccult stool negative x 3 in August 2020 - Cannot tolerate iron  pills because of severe constipation. CBC    Component Value Date/Time   WBC 7.7 11/29/2023 0754   RBC 4.12 11/29/2023 0754   HGB 12.8 11/29/2023 0754   HCT 37.6 11/29/2023 0754   PLT 283 11/29/2023 0754   MCV 91.3 11/29/2023 0754   MCH 31.1 11/29/2023 0754   MCHC 34.0 11/29/2023 0754   RDW 12.2 11/29/2023 0754   LYMPHSABS 3.2 11/29/2023 0754   MONOABS 0.5 11/29/2023 0754   EOSABS 0.1 11/29/2023 0754   BASOSABS 0.0 11/29/2023 0754  Latest Ref Rng & Units 11/29/2023    7:54 AM 06/04/2023    7:48 AM 11/19/2022    3:16 PM  CMP  Glucose 70 - 99 mg/dL 894  890  99   BUN 6 - 20 mg/dL 14  17  16    Creatinine 0.44 - 1.00 mg/dL 9.20  9.17  9.19   Sodium 135 - 145 mmol/L 140  139  139   Potassium 3.5 - 5.1 mmol/L 3.3  3.8  3.6   Chloride 98 - 111 mmol/L 105  104  101   CO2 22 - 32 mmol/L 26  26  28    Calcium 8.9 - 10.3 mg/dL 9.1  8.9  8.5   Total Protein 6.5 - 8.1 g/dL 6.7  6.8  7.4   Total Bilirubin 0.0 - 1.2 mg/dL 0.4  0.4  0.7   Alkaline Phos 38 - 126 U/L 76  70  70   AST 15 - 41 U/L 18  16  15    ALT 0 - 44 U/L 19  18  15       Lab Results  Component Value Date   FERRITIN 200 11/29/2023   VITAMINB12 287 10/01/2021    Vitals:   06/28/24 1439  BP: 130/69  Pulse:  70  Resp: 18  Temp: (!) 97.5 F (36.4 C)  SpO2: 100%    Review of System:  Review of Systems  Constitutional:  Positive for malaise/fatigue.  Respiratory:  Positive for cough and shortness of breath.   Gastrointestinal:  Positive for heartburn.  Neurological:  Positive for weakness. Negative for dizziness, tingling and headaches.    Physical Exam: Physical Exam Constitutional:      Appearance: Normal appearance. She is obese.  HENT:     Head: Normocephalic and atraumatic.  Eyes:     Pupils: Pupils are equal, round, and reactive to light.  Cardiovascular:     Rate and Rhythm: Normal rate and regular rhythm.     Heart sounds: Normal heart sounds. No murmur heard. Pulmonary:     Effort: Pulmonary effort is normal.     Breath sounds: Normal breath sounds. No wheezing.  Abdominal:     General: Bowel sounds are normal. There is no distension.     Palpations: Abdomen is soft.     Tenderness: There is no abdominal tenderness.  Musculoskeletal:        General: Normal range of motion.     Cervical back: Normal range of motion.  Skin:    General: Skin is warm and dry.     Findings: No rash.  Neurological:     Mental Status: She is alert and oriented to person, place, and time.     Gait: Gait is intact.  Psychiatric:        Mood and Affect: Mood and affect normal.        Cognition and Memory: Memory normal.        Judgment: Judgment normal.      I spent 25 minutes dedicated to the care of this patient (face-to-face and non-face-to-face) on the date of the encounter to include what is described in the assessment and plan.,  Delon Hope, NP 06/29/2024 9:34 AM "

## 2024-06-28 NOTE — Assessment & Plan Note (Addendum)
-   Vitamin D  level improved to 56.44 (36.28).   -Continue vitamin D  50,000 units every other week.

## 2024-06-29 ENCOUNTER — Encounter: Payer: Self-pay | Admitting: Oncology

## 2024-07-09 ENCOUNTER — Other Ambulatory Visit (INDEPENDENT_AMBULATORY_CARE_PROVIDER_SITE_OTHER): Payer: Self-pay | Admitting: Gastroenterology

## 2025-06-19 ENCOUNTER — Inpatient Hospital Stay

## 2025-06-26 ENCOUNTER — Inpatient Hospital Stay: Admitting: Oncology
